# Patient Record
Sex: Female | Born: 1970 | Race: Black or African American | Hispanic: No | Marital: Single | Smoking: Current every day smoker
Health system: Southern US, Community
[De-identification: ages and names within clinical notes are randomized; demographics above are authoritative.]

## PROBLEM LIST (undated history)

## (undated) DIAGNOSIS — J45909 Unspecified asthma, uncomplicated: Secondary | ICD-10-CM

## (undated) DIAGNOSIS — E119 Type 2 diabetes mellitus without complications: Secondary | ICD-10-CM

## (undated) DIAGNOSIS — K852 Alcohol induced acute pancreatitis without necrosis or infection: Secondary | ICD-10-CM

## (undated) HISTORY — DX: Unspecified asthma, uncomplicated: J45.909

## (undated) HISTORY — PX: NO PAST SURGERIES: SHX2092

## (undated) HISTORY — DX: Type 2 diabetes mellitus without complications: E11.9

---

## 2007-07-19 ENCOUNTER — Other Ambulatory Visit: Payer: Self-pay

## 2007-07-19 ENCOUNTER — Inpatient Hospital Stay: Payer: Self-pay | Admitting: Internal Medicine

## 2007-09-16 ENCOUNTER — Emergency Department: Payer: Self-pay | Admitting: Emergency Medicine

## 2007-10-04 ENCOUNTER — Emergency Department: Payer: Self-pay | Admitting: Emergency Medicine

## 2008-09-01 ENCOUNTER — Emergency Department: Payer: Self-pay | Admitting: Emergency Medicine

## 2009-04-09 ENCOUNTER — Emergency Department: Payer: Self-pay | Admitting: Emergency Medicine

## 2009-07-15 ENCOUNTER — Emergency Department: Payer: Self-pay | Admitting: Emergency Medicine

## 2012-04-07 LAB — HM DIABETES EYE EXAM

## 2012-06-12 ENCOUNTER — Inpatient Hospital Stay: Payer: Self-pay | Admitting: Internal Medicine

## 2012-06-12 LAB — URINALYSIS, COMPLETE
Bilirubin,UR: NEGATIVE
Glucose,UR: >=500 mg/dL (ref 0–75)
Leukocyte Esterase: NEGATIVE
Nitrite: NEGATIVE
Ph: 5 (ref 4.5–8.0)
Protein: NEGATIVE
RBC,UR: 1 /HPF (ref 0–5)
Squamous Epithelial: <1
WBC UR: 1 /HPF (ref 0–5)

## 2012-06-12 LAB — CBC
HGB: 14.5 g/dL (ref 12.0–16.0)
MCV: 95 fL (ref 80–100)
Platelet: 301 10*3/uL (ref 150–440)
RBC: 4.68 10*6/uL (ref 3.80–5.20)
WBC: 6 10*3/uL (ref 3.6–11.0)

## 2012-06-12 LAB — COMPREHENSIVE METABOLIC PANEL
Alkaline Phosphatase: 73 U/L (ref 50–136)
BUN: 9 mg/dL (ref 7–18)
Bilirubin,Total: 0.4 mg/dL (ref 0.2–1.0)
Chloride: 95 mmol/L — ABNORMAL LOW (ref 98–107)
Creatinine: 0.83 mg/dL (ref 0.60–1.30)
EGFR (Non-African Amer.): >60
Glucose: 596 mg/dL (ref 65–99)
Osmolality: 285 (ref 275–301)
SGOT(AST): 20 U/L (ref 15–37)
SGPT (ALT): 14 U/L
Total Protein: 8.5 g/dL — ABNORMAL HIGH (ref 6.4–8.2)

## 2012-06-12 LAB — TSH: Thyroid Stimulating Horm: 1.05 u[IU]/mL

## 2012-06-12 LAB — ETHANOL: Ethanol %: <0.003 % (ref 0.000–0.080)

## 2012-06-12 LAB — TROPONIN I: Troponin-I: <0.02 ng/mL

## 2012-06-13 LAB — MAGNESIUM: Magnesium: 1.3 mg/dL — ABNORMAL LOW

## 2012-06-13 LAB — LIPID PANEL
Ldl Cholesterol, Calc: 116 mg/dL — ABNORMAL HIGH (ref 0–100)
Triglycerides: 93 mg/dL (ref 0–200)

## 2012-06-13 LAB — CBC WITH DIFFERENTIAL/PLATELET
Basophil #: 0 10*3/uL (ref 0.0–0.1)
Eosinophil #: 0.1 10*3/uL (ref 0.0–0.7)
HCT: 35.2 % (ref 35.0–47.0)
Lymphocyte #: 2.4 10*3/uL (ref 1.0–3.6)
MCH: 30.9 pg (ref 26.0–34.0)
MCHC: 34.2 g/dL (ref 32.0–36.0)
MCV: 90 fL (ref 80–100)
Monocyte #: 0.6 x10 3/mm (ref 0.2–0.9)
Monocyte %: 11.8 %
Neutrophil #: 2.2 10*3/uL (ref 1.4–6.5)
Neutrophil %: 40.5 %
Platelet: 239 10*3/uL (ref 150–440)
RDW: 12.2 % (ref 11.5–14.5)
WBC: 5.3 10*3/uL (ref 3.6–11.0)

## 2012-06-13 LAB — HEMOGLOBIN A1C: Hemoglobin A1C: 13.9 % — ABNORMAL HIGH (ref 4.2–6.3)

## 2012-06-13 LAB — BASIC METABOLIC PANEL
BUN: 3 mg/dL — ABNORMAL LOW (ref 7–18)
Calcium, Total: 7.4 mg/dL — ABNORMAL LOW (ref 8.5–10.1)
Chloride: 110 mmol/L — ABNORMAL HIGH (ref 98–107)
Co2: 20 mmol/L — ABNORMAL LOW (ref 21–32)
Creatinine: 0.7 mg/dL (ref 0.60–1.30)
EGFR (African American): >60
EGFR (African American): >60
EGFR (Non-African Amer.): >60
EGFR (Non-African Amer.): >60
Glucose: 91 mg/dL (ref 65–99)
Osmolality: 278 (ref 275–301)
Potassium: 4 mmol/L (ref 3.5–5.1)
Sodium: 137 mmol/L (ref 136–145)

## 2012-06-14 LAB — BASIC METABOLIC PANEL
Anion Gap: 7 (ref 7–16)
Anion Gap: 8 (ref 7–16)
BUN: 1 mg/dL — ABNORMAL LOW (ref 7–18)
BUN: 2 mg/dL — ABNORMAL LOW (ref 7–18)
Calcium, Total: 7.7 mg/dL — ABNORMAL LOW (ref 8.5–10.1)
Chloride: 110 mmol/L — ABNORMAL HIGH (ref 98–107)
Chloride: 110 mmol/L — ABNORMAL HIGH (ref 98–107)
Chloride: 108 mmol/L — ABNORMAL HIGH (ref 98–107)
Co2: 24 mmol/L (ref 21–32)
Creatinine: 0.55 mg/dL — ABNORMAL LOW (ref 0.60–1.30)
Creatinine: 0.67 mg/dL (ref 0.60–1.30)
EGFR (Non-African Amer.): >60
EGFR (Non-African Amer.): >60
Glucose: 63 mg/dL — ABNORMAL LOW (ref 65–99)
Glucose: 60 mg/dL — ABNORMAL LOW (ref 65–99)
Osmolality: 275 (ref 275–301)
Potassium: 2.8 mmol/L — ABNORMAL LOW (ref 3.5–5.1)
Potassium: 3.5 mmol/L (ref 3.5–5.1)
Sodium: 143 mmol/L (ref 136–145)

## 2013-07-14 ENCOUNTER — Encounter: Payer: Self-pay | Admitting: Emergency Medicine

## 2013-07-14 ENCOUNTER — Encounter: Payer: Self-pay | Admitting: Adult Health

## 2013-07-14 ENCOUNTER — Ambulatory Visit (INDEPENDENT_AMBULATORY_CARE_PROVIDER_SITE_OTHER): Payer: BC Managed Care – PPO | Admitting: Adult Health

## 2013-07-14 VITALS — BP 106/50 | HR 103 | Temp 98.4°F | Resp 12 | Ht 62.5 in | Wt 126.0 lb

## 2013-07-14 DIAGNOSIS — Z0000 Encounter for general adult medical examination without abnormal findings: Secondary | ICD-10-CM | POA: Insufficient documentation

## 2013-07-14 DIAGNOSIS — Z1239 Encounter for other screening for malignant neoplasm of breast: Secondary | ICD-10-CM

## 2013-07-14 DIAGNOSIS — E119 Type 2 diabetes mellitus without complications: Secondary | ICD-10-CM | POA: Insufficient documentation

## 2013-07-14 LAB — HM DIABETES FOOT EXAM: HM Diabetic Foot Exam: NORMAL

## 2013-07-14 MED ORDER — INSULIN GLARGINE 100 UNIT/ML ~~LOC~~ SOLN: 20.0000 [IU] | Freq: Every day | SUBCUTANEOUS | Status: DC

## 2013-07-14 NOTE — Patient Instructions (Addendum)
   Thank you for choosing  at [facility] for your health care needs.  The results will be available through MyChart for your convenience. Please remember to activate this. The activation code is located at the end of this form.  I will request medical records from previous providers.  I am referring you to Endocrine.  I am ordering a Mammogram

## 2013-07-14 NOTE — Progress Notes (Signed)
Subjective:    Patient ID: Mary Haney, female    DOB: [birth year], 42 y.o.   MRN: [id number]  HPI  Patient is a pleasant 42 y/o female who presents to clinic to establish care. She did not have a primary care physician. In August 2013 she walked in to the ED because she was not feeling well. She was found to have a blood glucose level of 600 + and was admitted with DKA. She was followed by Dr. Alesia Morin immediately following initial diagnosis. She would like to establish with a [place] endocrinologist to keep everything within the same health system.   Past Medical History  Diagnosis Date  . Asthma     Has albuterol inhaler  . Diabetes mellitus without complication     Type 2 diagnosed 2013    History reviewed. No pertinent past surgical history.  Family History  Problem Relation Age of Onset  . Diabetes Father   . Depression Paternal Grandmother   . Depression Paternal Grandfather   . Depression Cousin     History   Social History  . Marital Status: Single    Spouse Name: N/A    Number of Children: 0  . Years of Education: 14   Occupational History  . Take Out Specialist     [employer]   Social History Main Topics  . Smoking status: Current Every Day Smoker -- 0.50 packs/day for 25 years    Types: Cigarettes  . Smokeless tobacco: Never Used  . Alcohol Use: 1.8 oz/week    3 Cans of beer per week  . Drug Use: No  . Sexually Active: Yes    Birth Control/ Protection: Condom   Other Topics Concern  . Not on file   Social History Narrative   [name] was born in [place], [place]. She moved with her family to [place] at age 47. She attended 2 years at [employer] and was majoring in [place] Studies. Her goal is to become a Nurse, learning disability in the [employer]. 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 Icel lives with her boyfriend, Vilinda Blanks. They are considering getting married soon. She enjoys sketching outdoor scenery, cartoons and loves to read.      Review of Systems  Constitutional: Negative.   HENT: Negative.   Eyes: Negative for visual disturbance.       Wears glasses. Has vision exam every 2 years  Respiratory: Negative.   Cardiovascular: Negative.   Gastrointestinal: Positive for blood in stool. Negative for constipation.       Hx of hemorrhoids  Endocrine: Negative.   Genitourinary: Negative.   Musculoskeletal: Negative.   Skin: Negative.   Allergic/Immunologic:       Seasonal allergies - takes allegra  Neurological: Negative.   Hematological: Negative.   Psychiatric/Behavioral: Negative.     BP 106/50  Pulse 103  Temp(Src) 98.4 F (36.9 C) (Oral)  Resp 12  Ht 5' 2.5" (1.588 m)  Wt 126 lb (57.153 kg)  BMI 22.66 kg/m2  SpO2 99%  LMP 06/27/2013    Objective:   Physical Exam  Constitutional: She is oriented to person, place, and time. She appears well-developed and well-nourished. No distress.  HENT:  Head: Normocephalic and atraumatic.  Right Ear: External ear normal.  Left Ear: External ear normal.  Nose: Nose normal.  Mouth/Throat: Oropharynx is clear and moist.  Eyes: Conjunctivae and EOM are normal. Pupils are equal, round, and reactive to light.  Neck: Normal range of motion. Neck  supple. No tracheal deviation present. No thyromegaly present.  Cardiovascular: Normal rate, regular rhythm, normal heart sounds and intact distal pulses.  Exam reveals no gallop and no friction rub.   No murmur heard. Pulmonary/Chest: Effort normal and breath sounds normal. No respiratory distress. She has no wheezes. She has no rales. She exhibits no tenderness.  Abdominal: Soft. Bowel sounds are normal. She exhibits no distension. There is no tenderness. There is no rebound and no guarding.  Genitourinary:  Deferred - done at health center last month. Will request records.  Musculoskeletal: Normal range of motion. She exhibits no edema and no tenderness.  Neurological: She is alert and oriented to person, place, and  time. She has normal reflexes.  Skin: Skin is warm and dry.  Psychiatric: She has a normal mood and affect. Her behavior is normal. Judgment and thought content normal.       Assessment & Plan:

## 2013-07-14 NOTE — Assessment & Plan Note (Signed)
Ordered mammography. Patient will call [facility] to schedule.

## 2013-07-14 NOTE — Assessment & Plan Note (Addendum)
Normal physical exam. Patient is a newly diagnosed type II diabetic currently on insulin therapy. She was being followed at [facility] clinic; however, she would like to stay within the [facility]. Will refer to [facility] end[facility]inology. Diabetic foot exam done today and was normal. Recent Pap done at health Department. Will request records. I will order a mammography. I will hold off on ordering labs since patient has had recent lab work done. When I receive her medical records I will evaluate if anything needs to be ordered.

## 2013-10-05 ENCOUNTER — Ambulatory Visit: Payer: BC Managed Care – PPO | Admitting: Internal Medicine

## 2013-10-13 ENCOUNTER — Other Ambulatory Visit: Payer: Self-pay

## 2013-10-30 ENCOUNTER — Emergency Department: Payer: Self-pay | Admitting: Emergency Medicine

## 2013-10-30 LAB — COMPREHENSIVE METABOLIC PANEL
Albumin: 3.6 g/dL (ref 3.4–5.0)
Alkaline Phosphatase: 62 U/L
Anion Gap: 11 (ref 7–16)
BUN: 7 mg/dL (ref 7–18)
Bilirubin,Total: 0.2 mg/dL (ref 0.2–1.0)
Chloride: 102 mmol/L (ref 98–107)
Creatinine: 0.76 mg/dL (ref 0.60–1.30)
EGFR (Non-African Amer.): >60
Potassium: 4 mmol/L (ref 3.5–5.1)
SGOT(AST): 58 U/L — ABNORMAL HIGH (ref 15–37)
SGPT (ALT): 27 U/L (ref 12–78)
Sodium: 138 mmol/L (ref 136–145)
Total Protein: 7.1 g/dL (ref 6.4–8.2)

## 2013-10-30 LAB — CBC WITH DIFFERENTIAL/PLATELET
Basophil #: 0.1 10*3/uL (ref 0.0–0.1)
Basophil %: 1.4 %
Eosinophil %: 0.6 %
HGB: 13 g/dL (ref 12.0–16.0)
Lymphocyte #: 1.4 10*3/uL (ref 1.0–3.6)
MCH: 30.4 pg (ref 26.0–34.0)
Monocyte #: 0.5 x10 3/mm (ref 0.2–0.9)
Neutrophil %: 62.3 %
Platelet: 324 10*3/uL (ref 150–440)
RBC: 4.27 10*6/uL (ref 3.80–5.20)

## 2013-11-02 ENCOUNTER — Telehealth: Payer: Self-pay | Admitting: Adult Health

## 2013-11-02 ENCOUNTER — Ambulatory Visit: Payer: BC Managed Care – PPO | Admitting: Adult Health

## 2013-11-02 ENCOUNTER — Other Ambulatory Visit: Payer: Self-pay | Admitting: Adult Health

## 2013-11-02 DIAGNOSIS — E1165 Type 2 diabetes mellitus with hyperglycemia: Secondary | ICD-10-CM

## 2013-11-02 DIAGNOSIS — IMO0002 Reserved for concepts with insufficient information to code with codable children: Secondary | ICD-10-CM

## 2013-11-02 NOTE — Telephone Encounter (Signed)
Pt states her diabetes is acting up and she would like to be referred back to endocrinologist.  States she was previously referred but had to cancel the appt in October due to some personal things going on.  Pt has appt today but would like to cancel and just go to endocrinologist.    Call cell, if she does not answer, call at work [phone], may have ask for pt.

## 2013-11-02 NOTE — Telephone Encounter (Signed)
Noted. Referral made

## 2013-11-07 ENCOUNTER — Other Ambulatory Visit: Payer: Self-pay

## 2013-11-07 ENCOUNTER — Telehealth: Payer: Self-pay | Admitting: Adult Health

## 2013-11-07 ENCOUNTER — Encounter: Payer: Self-pay | Admitting: Emergency Medicine

## 2013-11-07 MED ORDER — BLOOD GLUCOSE METER KIT: PACK | Status: DC

## 2013-11-07 MED ORDER — ACETONE (URINE) TEST VI STRP: 1.0000 | ORAL_STRIP | Status: DC | PRN

## 2013-11-07 NOTE — Telephone Encounter (Signed)
Pt is needing refill on Test strips. Pt uses [facility] on [address].

## 2013-11-07 NOTE — Telephone Encounter (Signed)
Left message pt to call back how often is she testing

## 2013-11-07 NOTE — Telephone Encounter (Signed)
rx sent in for Relion meter and test strips

## 2013-11-08 ENCOUNTER — Telehealth: Payer: Self-pay | Admitting: *Deleted

## 2013-11-08 NOTE — Telephone Encounter (Signed)
Pharmacy Note:  Prime test strips  Need Qty and how many per day

## 2013-11-11 ENCOUNTER — Telehealth: Payer: Self-pay | Admitting: Adult Health

## 2013-11-11 NOTE — Telephone Encounter (Signed)
Pt states she was referred to Dr. Elvera Lennox for endocrinology.  Pt states she has switched insurance and thinks she will need to switch to a [facility] doctor.  Would like a call.

## 2013-11-11 NOTE — Telephone Encounter (Signed)
Left message pt to call her ins. Co to see which enodcrinologist is in her network and let us know

## 2014-01-04 ENCOUNTER — Encounter: Payer: Self-pay | Admitting: Internal Medicine

## 2014-01-04 ENCOUNTER — Ambulatory Visit (INDEPENDENT_AMBULATORY_CARE_PROVIDER_SITE_OTHER): Payer: BC Managed Care – PPO | Admitting: Internal Medicine

## 2014-01-04 VITALS — BP 118/68 | HR 95 | Temp 98.4°F | Resp 12 | Ht 63.0 in | Wt 134.1 lb

## 2014-01-04 DIAGNOSIS — E119 Type 2 diabetes mellitus without complications: Secondary | ICD-10-CM

## 2014-01-04 LAB — HEMOGLOBIN A1C: Hgb A1c MFr Bld: 8.5 % — ABNORMAL HIGH (ref 4.6–6.5)

## 2014-01-04 MED ORDER — GLUCAGON (RDNA) 1 MG IJ KIT: 1.0000 mg | PACK | Freq: Once | INTRAMUSCULAR | Status: DC | PRN

## 2014-01-04 NOTE — Patient Instructions (Addendum)
Please continue current insulin regimen for now, except decrease Lantus to 15 units at night. When injecting insulin:  Inject in the abdomen  Rotate the injection sites around the belly button  Change needle for each injection  Keep needle in for 10 sec after last unit of insulin in  Keep the insulin in use out of the fridge We will refer you to diabetes education at [place]. Please stop at the lab. Please return in 1 month with your sugar log.    PATIENT INSTRUCTIONS FOR TYPE 2 DIABETES:  DIET AND EXERCISE Diet and exercise is an important part of diabetic treatment.  We recommended aerobic exercise in the form of brisk walking (working between 40-60% of maximal aerobic capacity, similar to brisk walking) for 150 minutes per week (such as 30 minutes five days per week) along with 3 times per week performing 'resistance' training (using various gauge rubber tubes with handles) 5-10 exercises involving the major muscle groups (upper body, lower body and core) performing 10-15 repetitions (or near fatigue) each exercise. Start at half the above goal but build slowly to reach the above goals. If limited by weight, joint pain, or disability, we recommend daily walking in a swimming pool with water up to waist to reduce pressure from joints while allow for adequate exercise.    BLOOD GLUCOSES Monitoring your blood glucoses is important for continued management of your diabetes. Please check your blood glucoses 2-4 times a day: fasting, before meals and at bedtime (you can rotate these measurements - e.g. one day check before the 3 meals, the next day check before 2 of the meals and before bedtime, etc.   HYPOGLYCEMIA (low blood sugar) Hypoglycemia is usually a reaction to not eating, exercising, or taking too much insulin/ other diabetes drugs.  Symptoms include tremors, sweating, hunger, confusion, headache, etc. Treat IMMEDIATELY with 15 grams of Carbs:   4 glucose tablets     cup regular juice/soda   2 tablespoons raisins   4 teaspoons sugar   1 tablespoon honey Recheck blood glucose in 15 mins and repeat above if still symptomatic/blood glucose <100. Please contact our office at [phone] if you have questions about how to next handle your insulin.  RECOMMENDATIONS TO REDUCE YOUR RISK OF DIABETIC COMPLICATIONS: * Take your prescribed MEDICATION(S). * Follow a DIABETIC diet: Complex carbs, fiber rich foods, heart healthy fish twice weekly, (monounsaturated and polyunsaturated) fats * AVOID saturated/trans fats, high fat foods, >2,300 mg salt per day. * EXERCISE at least 5 times a week for 30 minutes or preferably daily.  * DO NOT SMOKE OR DRINK more than 1 drink a day. * Check your FEET every day. Do not wear tightfitting shoes. Contact us if you develop an ulcer * See your EYE doctor once a year or more if needed * Get a FLU shot once a year * Get a PNEUMONIA vaccine once before and once after age 71 years  GOALS:  * Your Hemoglobin A1c of <7%  * fasting sugars need to be <130 * after meals sugars need to be <180 (2h after you start eating) * Your Systolic BP should be 063 or lower  * Your Diastolic BP should be 80 or lower  * Your HDL (Good Cholesterol) should be 40 or higher  * Your LDL (Bad Cholesterol) should be 100 or lower  * Your Triglycerides should be 150 or lower  * Your Urine microalbumin (kidney function) should be <30 * Your Body Mass Index should  be 25 or lower   We will be glad to help you achieve these goals. Our telephone number is: [phone].

## 2014-01-04 NOTE — Progress Notes (Signed)
Patient ID: Mary Haney, female   DOB: 1971-04-09, 43 y.o.   MRN: [id number]  HPI: Mary Haney is a 43 y.o.-year-old female, referred by her PCP, Rey,[name], NP, for management of DM2, insulin-dependent, uncontrolled, without complications.  Per PCP's note: She did not have a primary care physician. In August 2013 she walked in to the ED because she was not feeling well. She was found to have a blood glucose level of 600 + and was admitted with DKA. She was followed by [name] immediately following initial diagnosis. She would like to establish with a [facility] endocrinologist to keep everything within the same health system.  Patient has been diagnosed with diabetes in 2013. Last hemoglobin A1c not in the system: ~10% approx 9 mo ago.  Pt is on a regimen of: - Lantus 20 units qhs - Novolog 3 units tid ac and 1 unit for snacks  In 10/2013, due to lack of finances, she was stretching the insulin doses >> sugars high >> low CBG 25-26 >> ED.   Pt checks her sugars >3x a day and they are: - am: 90-140  (~1-2x a week: 200-300 - unexplained) - 2h after b'fast: n/c - before lunch: 123-140 - 2h after lunch: n/c - before dinner: 80-100 - 2h after dinner: n/c - bedtime: n/c - nighttime: n/c + lows - see above. Lowest sugar was 55-60 when delays a meal; she has hypoglycemia awareness at 60.  Highest sugar was 300.  Has ReliOn meter.  Pt's meals are: - Breakfast: oatmeal, yoghurt, sausage bisquit - Lunch: salad, soup, sandwiches - Dinner: meat + starch + vegetables - Snacks: yoghurt, peanut crackers, nuts  - no CKD - no HL - last eye exam was in 1 year ago. No DR.  - no numbness and tingling in her feet. Foot exam normal, checked by PCP 07/2013.  Pt has FH of DM in PGF, father, MGF, aunt  ROS: Constitutional: no weight gain/loss, no fatigue, no subjective hyperthermia/hypothermia Eyes: no blurry vision, no xerophthalmia ENT: no sore throat, no nodules palpated in throat, no  dysphagia/odynophagia, no hoarseness Cardiovascular: no CP/SOB/palpitations/leg swelling Respiratory: + cough/no SOB Gastrointestinal: no N/V/D/C Musculoskeletal: no muscle/joint aches Skin: + rash (eczema) Neurological: no tremors/numbness/tingling/dizziness Psychiatric: no depression/anxiety  Past Medical History  Diagnosis Date  . Asthma     Has albuterol inhaler  . Diabetes mellitus without complication     Type 2 diagnosed 2013   History reviewed. No pertinent past surgical history. History   Social History  . Marital Status: Single    Spouse Name: N/A    Number of Children: 0  . Years of Education: 14   Occupational History  . Take Out Specialist     [employer]   Social History Main Topics  . Smoking status: Current Every Day Smoker -- 0.50 packs/day for 25 years    Types: Cigarettes  . Smokeless tobacco: Never Used  . Alcohol Use: 1.8 oz/week    3 Cans of beer per week  . Drug Use: No  . Sexual Activity: Yes    Birth Control/ Protection: Condom   Social History Narrative   Mary Haney was born in [place], [place]. She moved with her family to [place] at age 31. She attended 2 years at [employer] and was majoring in [place]. Her goal is to become a Optometrist in the [employer]. 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 [name] lives with her boyfriend, Mary Haney. They are considering  getting married soon. She enjoys sketching outdoor scenery, cartoons and loves to read.   Current Outpatient Prescriptions on File Prior to Visit  Medication Sig Dispense Refill  . acetone, urine, test strip 1 strip by Does not apply route as needed for high blood sugar.  25 each  0  . albuterol (PROVENTIL HFA;VENTOLIN HFA) 108 (90 BASE) MCG/ACT inhaler Inhale 2 puffs into the lungs every 6 (six) hours as needed for wheezing.      . Blood Glucose Monitoring Suppl (BLOOD GLUCOSE METER) kit Use as instructed  1 each  0  . cetirizine (ZYRTEC) 10 MG tablet  Take 10 mg by mouth daily.      . insulin glargine (LANTUS) 100 UNIT/ML injection Inject 0.2 mLs (20 Units total) into the skin at bedtime.  10 mL  6  . Insulin Lispro, Human, (HUMALOG KWIKPEN [place]) Inject 3 Units into the skin 3 (three) times daily with meals. 1 unit with snacks + sliding scale       No current facility-administered medications on file prior to visit.   No Known Allergies Family History  Problem Relation Age of Onset  . Diabetes Father   . Depression Paternal Grandmother   . Depression Paternal Grandfather   . Depression Cousin    PE: BP 118/68  Pulse 95  Temp(Src) 98.4 F (36.9 C) (Oral)  Resp 12  Ht '5\' 3"'  (1.6 m)  Wt 134 lb 1.9 oz (60.836 kg)  BMI 23.76 kg/m2  SpO2 97% Wt Readings from Last 3 Encounters:  01/04/14 134 lb 1.9 oz (60.836 kg)  07/14/13 126 lb (57.153 kg)   Constitutional: normal weight, in NAD Eyes: PERRLA, EOMI, no exophthalmos ENT: moist mucous membranes, no thyromegaly, no cervical lymphadenopathy Cardiovascular: RRR, No MRG Respiratory: CTA B Gastrointestinal: abdomen soft, NT, ND, BS+ Musculoskeletal: no deformities, strength intact in all 4 Skin: moist, warm, no rashes Neurological: no tremor with outstretched hands, DTR normal in all 4  ASSESSMENT: 1. DM2, non-insulin-dependent, uncontrolled, without complications  PLAN:  1. Patient with recently dx-ed DM, unclear if type 1 or 2, on basal-bolus insulin regimen,with fair control - We discussed about options for treatment, and I suggested to:  Patient Instructions  Please continue current insulin regimen for now, except decrease Lantus to 15 units at night. When injecting insulin:  Inject in the abdomen  Rotate the injection sites around the belly button  Change needle for each injection  Keep needle in for 10 sec after last unit of insulin in  Keep the insulin in use out of the fridge We will refer you to diabetes education at [place]. Please stop at the  lab. Please return in 1 month with your sugar log.  - sent glucagon emergency kit Rx x2 to the pharmacy and advised her how to use it - Strongly advised her to start checking sugars at different times of the day - check 3 times a day, rotating checks - given sugar log and advised how to fill it and to bring it at next appt  - given foot care handout and explained the principles  - given instructions for hypoglycemia management "15-15 rule"  - advised for yearly eye exams - check Hba1c, will also need a C-pp, randon Glu, anti GAD and anti-ICA Abx to see if tyoe 1 - Return to clinic in 1 mo with sugar log   Office Visit on 01/04/2014  Component Date Value Range Status  . Hemoglobin A1C 01/04/2014 8.5* 4.6 - 6.5 %  Final   Glycemic Control Guidelines for People with Diabetes:Non Diabetic:  <6%Goal of Therapy: <7%Additional Action Suggested:  >8%    Will await the rest of the labs: Orders Placed This Encounter  Procedures  . HgB A1c  . C-peptide  . Glucose, Random  . Glutamic acid decarboxylase auto abs  . Anti-islet cell antibody  . Ambulatory referral to diabetic education   01/10/2014 Labs still pending >> I will addend the results when they become available.

## 2014-01-05 ENCOUNTER — Telehealth: Payer: Self-pay | Admitting: Radiology

## 2014-01-05 NOTE — Telephone Encounter (Signed)
I called and spoke with the patient about doing additional lab work. I gave her the instructions to follow about blood sugar being below 150 on her monitor before coming in. She will come in the next few days.

## 2014-01-27 ENCOUNTER — Encounter: Payer: Self-pay | Admitting: Adult Health

## 2014-01-27 ENCOUNTER — Ambulatory Visit (INDEPENDENT_AMBULATORY_CARE_PROVIDER_SITE_OTHER): Payer: BC Managed Care – PPO | Admitting: Adult Health

## 2014-01-27 VITALS — BP 102/72 | HR 85 | Temp 98.2°F | Resp 14 | Wt 133.0 lb

## 2014-01-27 DIAGNOSIS — K1379 Other lesions of oral mucosa: Secondary | ICD-10-CM

## 2014-01-27 DIAGNOSIS — K1370 Unspecified lesions of oral mucosa: Secondary | ICD-10-CM

## 2014-01-27 MED ORDER — CHLORHEXIDINE GLUCONATE 0.12 % MT SOLN: 15.0000 mL | Freq: Two times a day (BID) | OROMUCOSAL | Status: DC

## 2014-01-27 NOTE — Progress Notes (Signed)
Patient ID: Mary Haney, female   DOB: July 19, 1971, 43 y.o.   MRN: [id number]    Subjective:    Patient ID: Mary Haney, female    DOB: [birth year], 43 y.o.   MRN: [id number]  HPI  Kalene presents to clinic with c/o the roof of her mouth being sore. She does not recall eating anything hard or too hot that may have irritated the roof of her mouth. She reports tenderness in the area making it difficult to eat.   Past Medical History  Diagnosis Date  . Asthma     Has albuterol inhaler  . Diabetes mellitus without complication     Type 2 diagnosed 2013    Current Outpatient Prescriptions on File Prior to Visit  Medication Sig Dispense Refill  . albuterol (PROVENTIL HFA;VENTOLIN HFA) 108 (90 BASE) MCG/ACT inhaler Inhale 2 puffs into the lungs every 6 (six) hours as needed for wheezing.      . Blood Glucose Monitoring Suppl (BLOOD GLUCOSE METER) kit Use as instructed  1 each  0  . cetirizine (ZYRTEC) 10 MG tablet Take 10 mg by mouth daily.      Marland Kitchen glucagon (GLUCAGON EMERGENCY) 1 MG injection Inject 1 mg into the muscle once as needed.  2 each  prn  . insulin glargine (LANTUS) 100 UNIT/ML injection Inject 0.2 mLs (20 Units total) into the skin at bedtime.  10 mL  6  . Insulin Lispro, Human, (HUMALOG KWIKPEN [place]) Inject 3 Units into the skin 3 (three) times daily with meals. 1 unit with snacks + sliding scale       No current facility-administered medications on file prior to visit.     Review of Systems  Constitutional: Negative.  Negative for fever, chills and fatigue.  HENT: Positive for mouth sores (hard palate with small area of irritation).   Respiratory: Negative.   Cardiovascular: Negative.   Gastrointestinal: Negative.   Neurological: Negative.   All other systems reviewed and are negative.       Objective:  BP 102/72  Pulse 85  Temp(Src) 98.2 F (36.8 C) (Oral)  Resp 14  Wt 133 lb (60.328 kg)  SpO2 99%  LMP 01/17/2014   Physical Exam  Constitutional: She is  oriented to person, place, and time. She appears well-developed and well-nourished. No distress.  HENT:  Small lesion on hard palate without any redness   Cardiovascular: Normal rate and regular rhythm.   Pulmonary/Chest: Effort normal. No respiratory distress.  Neurological: She is alert and oriented to person, place, and time.  Psychiatric: She has a normal mood and affect. Her behavior is normal. Judgment and thought content normal.       Assessment & Plan:   1. Lesion of hard palate Appears irritated from either eating something hard or too hot. Start Peridex mouth rinse bid x 1 week. Instructed pt to eat bland foods. If no improvement will refer to ENT.

## 2014-01-27 NOTE — Progress Notes (Signed)
Pre visit review using our clinic review tool, if applicable. No additional management support is needed unless otherwise documented below in the visit note. 

## 2014-01-27 NOTE — Patient Instructions (Signed)
  Use Peridex mouth rinse twice a day after brushing your teeth.  Please let me know through MyChart if your symptoms are not improved by Wednesday and I will refer you to ENT.

## 2014-01-30 ENCOUNTER — Telehealth: Payer: Self-pay | Admitting: Adult Health

## 2014-01-30 NOTE — Telephone Encounter (Signed)
Relevant patient education assigned to patient using Emmi. ° °

## 2014-02-14 ENCOUNTER — Telehealth: Payer: Self-pay | Admitting: Adult Health

## 2014-02-14 NOTE — Telephone Encounter (Signed)
Humalog quik pen needed.  [place]

## 2014-02-14 NOTE — Telephone Encounter (Signed)
Left vm advising pt to have her pharmacy send [place] the medication refill

## 2014-02-17 ENCOUNTER — Telehealth: Payer: Self-pay

## 2014-02-17 ENCOUNTER — Other Ambulatory Visit: Payer: Self-pay | Admitting: *Deleted

## 2014-02-17 ENCOUNTER — Telehealth: Payer: Self-pay | Admitting: Internal Medicine

## 2014-02-17 MED ORDER — INSULIN ASPART 100 UNIT/ML FLEXPEN: PEN_INJECTOR | SUBCUTANEOUS | Status: DC

## 2014-02-17 NOTE — Telephone Encounter (Signed)
Please read note below. I am sure the pt meant novolog. Please advise. Thank you.

## 2014-02-17 NOTE — Telephone Encounter (Signed)
Pt request refill on insulin that [name] Rey NP and Dr Cruzita Lederer has not filled;transferred pt to Dr Arman Filter office [phone].

## 2014-02-17 NOTE — Telephone Encounter (Signed)
Rx changed to Novolog flexpen.

## 2014-02-17 NOTE — Telephone Encounter (Signed)
Please call in the rx for novo nordisk instead of humalog so her insurance will cover it

## 2014-02-17 NOTE — Telephone Encounter (Signed)
Yes, NovoLog - can you please change? Thank you!

## 2014-02-23 ENCOUNTER — Encounter: Payer: Self-pay | Admitting: Adult Health

## 2014-02-23 ENCOUNTER — Ambulatory Visit (INDEPENDENT_AMBULATORY_CARE_PROVIDER_SITE_OTHER): Payer: BC Managed Care – PPO | Admitting: Adult Health

## 2014-02-23 VITALS — BP 110/78 | HR 90 | Temp 98.4°F | Resp 14 | Wt 134.0 lb

## 2014-02-23 DIAGNOSIS — N898 Other specified noninflammatory disorders of vagina: Secondary | ICD-10-CM

## 2014-02-23 DIAGNOSIS — R300 Dysuria: Secondary | ICD-10-CM | POA: Insufficient documentation

## 2014-02-23 DIAGNOSIS — M542 Cervicalgia: Secondary | ICD-10-CM | POA: Insufficient documentation

## 2014-02-23 LAB — POCT URINALYSIS DIPSTICK
Bilirubin, UA: NEGATIVE
Glucose, UA: NEGATIVE
Ketones, UA: NEGATIVE
Leukocytes, UA: NEGATIVE
Nitrite, UA: NEGATIVE
PROTEIN UA: NEGATIVE
RBC UA: NEGATIVE
Spec Grav, UA: 1.02
UROBILINOGEN UA: 0.2
pH, UA: 6

## 2014-02-23 MED ORDER — CYCLOBENZAPRINE HCL 5 MG PO TABS: 5.0000 mg | ORAL_TABLET | Freq: Three times a day (TID) | ORAL | Status: DC | PRN

## 2014-02-23 MED ORDER — METRONIDAZOLE 500 MG PO TABS: 500.0000 mg | ORAL_TABLET | Freq: Two times a day (BID) | ORAL | Status: DC

## 2014-02-23 NOTE — Progress Notes (Signed)
Pre visit review using our clinic review tool, if applicable. No additional management support is needed unless otherwise documented below in the visit note. 

## 2014-02-23 NOTE — Progress Notes (Signed)
Patient ID: Mary Haney, female   DOB: [birth year], 43 y.o.   MRN: [id number]    Subjective:    Patient ID: Mary Haney, female    DOB: 1971/02/02, 43 y.o.   MRN: [id number]  HPI  Pt is a pleasant 42 y/o female who presents to clinic with irritation around genitalia. She reports burning and stinging when urine comes into contact with skin. She reports having some discharge.  Reports neck pain. 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 Does not recall particular incident but neck started hurting a few days ago. Good ROM but painful. No headache.  Past Medical History  Diagnosis Date  . Asthma     Has albuterol inhaler  . Diabetes mellitus without complication     Type 2 diagnosed 2013    Current Outpatient Prescriptions on File Prior to Visit  Medication Sig Dispense Refill  . albuterol (PROVENTIL HFA;VENTOLIN HFA) 108 (90 BASE) MCG/ACT inhaler Inhale 2 puffs into the lungs every 6 (six) hours as needed for wheezing.      . Blood Glucose Monitoring Suppl (BLOOD GLUCOSE METER) kit Use as instructed  1 each  0  . cetirizine (ZYRTEC) 10 MG tablet Take 10 mg by mouth daily.      . chlorhexidine (PERIDEX) 0.12 % solution Use as directed 15 mLs in the mouth or throat 2 (two) times daily.  120 mL  0  . glucagon (GLUCAGON EMERGENCY) 1 MG injection Inject 1 mg into the muscle once as needed.  2 each  prn  . insulin aspart (NOVOLOG FLEXPEN) 100 UNIT/ML FlexPen Inject 3 Units into the skin 3 (three) times daily with meals. 1 unit with snacks + sliding scale  15 mL  2  . insulin glargine (LANTUS) 100 UNIT/ML injection Inject 0.2 mLs (20 Units total) into the skin at bedtime.  10 mL  6  . triamcinolone ointment (KENALOG) 0.1 %        No current facility-administered medications on file prior to visit.     Review of Systems  Constitutional: Negative.   Respiratory: Negative.   Cardiovascular: Negative.   Genitourinary: Positive for dysuria (burning when  urine comes in contact with skin). Negative for hematuria, vaginal bleeding, vaginal discharge and pelvic pain.  Musculoskeletal: Positive for neck pain. Negative for back pain and neck stiffness.  Neurological: Negative.   Psychiatric/Behavioral: Negative.   All other systems reviewed and are negative.       Objective:  BP 110/78  Pulse 90  Temp(Src) 98.4 F (36.9 C) (Oral)  Resp 14  Wt 134 lb (60.782 kg)  SpO2 98%  LMP 02/14/2014   Physical Exam  Constitutional: She appears well-developed and well-nourished. No distress.  Cardiovascular: Normal rate and regular rhythm.   Pulmonary/Chest: Effort normal. No respiratory distress.  Abdominal: Hernia confirmed negative in the right inguinal area and confirmed negative in the left inguinal area.  Genitourinary: Rectal exam shows external hemorrhoid. No labial fusion. There is no rash, tenderness, lesion or injury on the right labia. There is no rash, tenderness, lesion or injury on the left labia. Uterus is not deviated, not enlarged, not fixed and not tender. Cervix exhibits no motion tenderness, no discharge and no friability. Right adnexum displays no mass, no tenderness and no fullness. Left adnexum displays no mass, no tenderness and no fullness. No erythema, tenderness or bleeding around the vagina. No foreign body around the vagina. No signs of injury around the vagina. Vaginal  discharge found.    Musculoskeletal: Normal range of motion.  Pain on right side of her neck with turning head towards the right. Less pain when turning towards the left. Good ROM with movement of head up and down.  Lymphadenopathy:       Right: No inguinal adenopathy present.       Left: No inguinal adenopathy present.       Assessment & Plan:   1. Dysuria UA without evidence of UTI - POCT urinalysis dipstick  2. Vaginal discharge Copious amounts of white discharge at cervix. External genitalia without lesions, ulcerations, inflammation, warts.  There are external hemorrhoids. No cystocele, rectocele or prolapsed uterus. Cervix without inflammation, lesions, growth, nodules. There is discharge noted. There was no bleeding. Vaginal walls also normal - pink and rugose without inflammation, ulcers or color changes. Bimanual exam also normal.  No tenderness noted with palpation of the uterus. No adnexal masses appreciated during exam.  - Wet Prep - Vaginal culture   3. Neck pain Suspect secondary to pulled muscle. Ice alternate with heat, ibuprofen and flexeril. See pt instruction for full POC.

## 2014-02-23 NOTE — Patient Instructions (Signed)
  Start metronidazole 500 mg twice a day for 7 days.  Flexeril 3 times a day as needed for muscle spasms in your neck.  Ibuprofen 400 mg every 6 hours for the next 4 days and then as needed.  Apply ice alternating with heat to your neck 3-4 times a day.  Please call with any questions or concerns.

## 2014-02-24 ENCOUNTER — Telehealth: Payer: Self-pay | Admitting: Adult Health

## 2014-02-24 NOTE — Telephone Encounter (Signed)
Relevant patient education assigned to patient using Emmi. ° °

## 2014-02-24 NOTE — Addendum Note (Signed)
Addended by: Marchia Bond on: 02/24/2014 08:27 AM   Modules accepted: Orders

## 2014-02-27 LAB — WET PREP BY MOLECULAR PROBE
CANDIDA SPECIES: POSITIVE — AB
Gardnerella vaginalis: POSITIVE — AB
Trichomonas vaginosis: NEGATIVE

## 2014-02-27 LAB — CULTURE, ROUTINE-GENITAL: ORGANISM ID, BACTERIA: NORMAL

## 2014-02-28 ENCOUNTER — Encounter: Payer: Self-pay | Admitting: Adult Health

## 2014-03-02 NOTE — Telephone Encounter (Signed)
Unread mychart message mailed  

## 2014-03-09 ENCOUNTER — Encounter: Payer: Self-pay | Admitting: Adult Health

## 2014-03-09 ENCOUNTER — Ambulatory Visit (INDEPENDENT_AMBULATORY_CARE_PROVIDER_SITE_OTHER): Payer: BC Managed Care – PPO | Admitting: Adult Health

## 2014-03-09 ENCOUNTER — Ambulatory Visit (INDEPENDENT_AMBULATORY_CARE_PROVIDER_SITE_OTHER)
Admission: RE | Admit: 2014-03-09 | Discharge: 2014-03-09 | Disposition: A | Discharge status: Home / Self Care | Payer: BC Managed Care – PPO | Source: Ambulatory Visit | Attending: Adult Health | Admitting: Adult Health

## 2014-03-09 VITALS — BP 108/64 | HR 103 | Temp 98.4°F | Wt 135.0 lb

## 2014-03-09 DIAGNOSIS — M542 Cervicalgia: Secondary | ICD-10-CM

## 2014-03-09 MED ORDER — METHOCARBAMOL 750 MG PO TABS: 750.0000 mg | ORAL_TABLET | Freq: Three times a day (TID) | ORAL | Status: DC

## 2014-03-09 MED ORDER — HYDROCODONE-ACETAMINOPHEN 5-325 MG PO TABS: 1.0000 | ORAL_TABLET | Freq: Four times a day (QID) | ORAL | Status: DC | PRN

## 2014-03-09 NOTE — Progress Notes (Signed)
Pre visit review using our clinic review tool, if applicable. No additional management support is needed unless otherwise documented below in the visit note. 

## 2014-03-09 NOTE — Progress Notes (Signed)
Patient ID: Mary Haney, female   DOB: 1971/09/08, 43 y.o.   MRN: [id number]   Subjective:    Patient ID: Mary Haney, female    DOB: 08-06-1971, 43 y.o.   MRN: [id number]  HPI  Mary Haney presents with neck pain that has been ongoing for ~ 1 month. She woke up one morning with the tightness and pain. Thought she had slept wrong. She works at [employer] and is always carrying heavy objects. She is right handed. Pain is mainly on the right side. I had prescribed some flexeril and asked her to take ibuprofen; however, this has not really helped her.   Past Medical History  Diagnosis Date  . Asthma     Has albuterol inhaler  . Diabetes mellitus without complication     Type 2 diagnosed 2013    Current Outpatient Prescriptions on File Prior to Visit  Medication Sig Dispense Refill  . albuterol (PROVENTIL HFA;VENTOLIN HFA) 108 (90 BASE) MCG/ACT inhaler Inhale 2 puffs into the lungs every 6 (six) hours as needed for wheezing.      . Blood Glucose Monitoring Suppl (BLOOD GLUCOSE METER) kit Use as instructed  1 each  0  . cetirizine (ZYRTEC) 10 MG tablet Take 10 mg by mouth daily.      . chlorhexidine (PERIDEX) 0.12 % solution Use as directed 15 mLs in the mouth or throat 2 (two) times daily.  120 mL  0  . cyclobenzaprine (FLEXERIL) 5 MG tablet Take 1 tablet (5 mg total) by mouth 3 (three) times daily as needed for muscle spasms.  30 tablet  1  . glucagon (GLUCAGON EMERGENCY) 1 MG injection Inject 1 mg into the muscle once as needed.  2 each  prn  . insulin aspart (NOVOLOG FLEXPEN) 100 UNIT/ML FlexPen Inject 3 Units into the skin 3 (three) times daily with meals. 1 unit with snacks + sliding scale  15 mL  2  . insulin glargine (LANTUS) 100 UNIT/ML injection Inject 0.2 mLs (20 Units total) into the skin at bedtime.  10 mL  6  . metroNIDAZOLE (FLAGYL) 500 MG tablet Take 1 tablet (500 mg total) by mouth 2 (two) times daily.  14 tablet  0  . triamcinolone ointment (KENALOG) 0.1 %        No  current facility-administered medications on file prior to visit.     Review of Systems  Constitutional: Negative for fever.  HENT: Negative.   Respiratory: Negative.   Cardiovascular: Negative.   Musculoskeletal: Positive for neck pain.       Muscle stiffness mainly on the right side. Difficulty turning head towards the right.  Neurological: Negative for dizziness, tremors, weakness, light-headedness and headaches.  All other systems reviewed and are negative.       Objective:  BP 108/64  Pulse 103  Temp(Src) 98.4 F (36.9 C) (Oral)  Wt 135 lb (61.236 kg)  SpO2 99%  LMP 03/06/2014   Physical Exam  Constitutional: She is oriented to person, place, and time. She appears well-developed and well-nourished. No distress.  Cardiovascular: Normal rate.   Pulmonary/Chest: Effort normal. No respiratory distress.  Musculoskeletal: She exhibits tenderness. She exhibits no edema.  Able to flex and extend neck without discomfort. Pain with turning head towards the right side. Pain is localized in the cervical region with tenderness towards the right trapezius area. No other symptoms associated with her pain  Neurological: She is alert and oriented to person, place, and time. No cranial nerve deficit. Coordination normal.  Skin: Skin is warm and dry. No rash noted. No erythema.  Psychiatric: She has a normal mood and affect. Her behavior is normal. Judgment and thought content normal.       Assessment & Plan:   1. Neck pain Send for plain films of the cervical region. Avoid carrying heavy objects with right hand to allow for rest. Will change muscle relaxer to robaxin and add norco short term. Continue ice. Ibuprofen in between the norco for inflammation. If no improvement within the next week will refer to ortho.  - DG Cervical Spine Complete; Future

## 2014-03-10 ENCOUNTER — Encounter: Payer: Self-pay | Admitting: Adult Health

## 2014-03-10 ENCOUNTER — Other Ambulatory Visit: Payer: Self-pay | Admitting: Adult Health

## 2014-03-10 DIAGNOSIS — M542 Cervicalgia: Secondary | ICD-10-CM

## 2014-03-14 NOTE — Telephone Encounter (Signed)
Mailed unread MyChart message

## 2014-06-12 ENCOUNTER — Other Ambulatory Visit: Payer: Self-pay | Admitting: *Deleted

## 2014-06-12 MED ORDER — INSULIN GLARGINE 100 UNIT/ML ~~LOC~~ SOLN: 20.0000 [IU] | Freq: Every day | SUBCUTANEOUS | Status: DC

## 2014-06-28 ENCOUNTER — Encounter: Payer: Self-pay | Admitting: *Deleted

## 2014-06-30 NOTE — Telephone Encounter (Signed)
Mailed unread message to pt  

## 2014-07-14 ENCOUNTER — Ambulatory Visit: Payer: BC Managed Care – PPO | Admitting: Adult Health

## 2014-07-20 ENCOUNTER — Ambulatory Visit (INDEPENDENT_AMBULATORY_CARE_PROVIDER_SITE_OTHER): Payer: BC Managed Care – PPO | Admitting: Adult Health

## 2014-07-20 ENCOUNTER — Encounter: Payer: Self-pay | Admitting: Adult Health

## 2014-07-20 VITALS — BP 116/81 | HR 97 | Temp 98.4°F | Resp 14 | Ht 63.0 in | Wt 140.0 lb

## 2014-07-20 DIAGNOSIS — F17200 Nicotine dependence, unspecified, uncomplicated: Secondary | ICD-10-CM

## 2014-07-20 DIAGNOSIS — IMO0001 Reserved for inherently not codable concepts without codable children: Secondary | ICD-10-CM

## 2014-07-20 DIAGNOSIS — E1165 Type 2 diabetes mellitus with hyperglycemia: Secondary | ICD-10-CM

## 2014-07-20 DIAGNOSIS — IMO0002 Reserved for concepts with insufficient information to code with codable children: Secondary | ICD-10-CM

## 2014-07-20 DIAGNOSIS — Z716 Tobacco abuse counseling: Secondary | ICD-10-CM

## 2014-07-20 DIAGNOSIS — E1065 Type 1 diabetes mellitus with hyperglycemia: Secondary | ICD-10-CM | POA: Insufficient documentation

## 2014-07-20 DIAGNOSIS — L0293 Carbuncle, unspecified: Secondary | ICD-10-CM

## 2014-07-20 DIAGNOSIS — Z1239 Encounter for other screening for malignant neoplasm of breast: Secondary | ICD-10-CM

## 2014-07-20 DIAGNOSIS — Z23 Encounter for immunization: Secondary | ICD-10-CM

## 2014-07-20 DIAGNOSIS — Z7189 Other specified counseling: Secondary | ICD-10-CM

## 2014-07-20 DIAGNOSIS — L0292 Furuncle, unspecified: Secondary | ICD-10-CM

## 2014-07-20 LAB — HEMOGLOBIN A1C: Hgb A1c MFr Bld: 9 % — ABNORMAL HIGH (ref 4.6–6.5)

## 2014-07-20 LAB — MICROALBUMIN / CREATININE URINE RATIO
Creatinine,U: 31.5 mg/dL
MICROALB/CREAT RATIO: 1 mg/g (ref 0.0–30.0)
Microalb, Ur: 0.3 mg/dL (ref 0.0–1.9)

## 2014-07-20 MED ORDER — DOXYCYCLINE HYCLATE 100 MG PO TABS: 100.0000 mg | ORAL_TABLET | Freq: Two times a day (BID) | ORAL | Status: DC

## 2014-07-20 MED ORDER — VARENICLINE TARTRATE 0.5 MG X 11 & 1 MG X 42 PO MISC: ORAL | Status: DC

## 2014-07-20 NOTE — Patient Instructions (Signed)
  Start doxycycline 100 mg twice a day for 10 days. You can try an antibacterial soap to see if this helps prevent future boils.  Please have labs drawn prior to leaving the office. We will contact you with results along with additional instructions.  See Mary Haney prior to leaving the office to schedule your mammogram.  Remember to call and schedule your diabetic eye exam as soon as possible.  You received your tetanus vaccine today. This is good for 10 years. Please schedule a nurse visit to have your pneumonia vaccine within the next one to 2 months.  I have sent in a prescription for Chantix. It is the starter kit with instructions on how to begin. Please let me know if this is working for you and I will send in refills.

## 2014-07-20 NOTE — Addendum Note (Signed)
Addended by: Kerin Salen R on: 07/20/2014 10:00 AM   Modules accepted: Orders

## 2014-07-20 NOTE — Progress Notes (Signed)
Pre visit review using our clinic review tool, if applicable. No additional management support is needed unless otherwise documented below in the visit note. 

## 2014-07-20 NOTE — Addendum Note (Signed)
Addended by: Geni Bers on: 07/20/2014 08:49 AM   Modules accepted: Orders

## 2014-07-20 NOTE — Progress Notes (Signed)
Patient ID: Mary Haney, female   DOB: 1971/08/18, 43 y.o.   MRN: [id number]    Subjective:    Patient ID: Mary Haney, female    DOB: 13-Aug-1971, 43 y.o.   MRN: [id number]  HPI  Pt is a pleasant 43 y/o female with hx of uncontrolled diabetes who presents with concerns about facial swelling and boils on groin area. She shaves genital area/perineum. Reports that she uses an Copy and has not really noticed getting nicked or having a hair ingrown. She reports having other boils that have "ruptured and gone away". No fever or chills reported.  She will have her diabetic foot exam today while in clinic. Advised that she needs to schedule her yearly diabetic eye exam. She reports the last time she had an eye exam was about 2 years ago.  Nimo is requesting assistance with smoking cessation. She has tried nicotine patches and gum without any success. She has had a prescription for Chantix in the past but never took the medication. She would like to try this.  Patient has not had a mammogram. An order has been placed back in January but she never scheduled to have it done.  She has not had a tetanus vaccine in a very long time.  Past Medical History  Diagnosis Date  . Asthma     Has albuterol inhaler  . Diabetes mellitus without complication     Type 2 diagnosed 2013    Current Outpatient Prescriptions on File Prior to Visit  Medication Sig Dispense Refill  . albuterol (PROVENTIL HFA;VENTOLIN HFA) 108 (90 BASE) MCG/ACT inhaler Inhale 2 puffs into the lungs every 6 (six) hours as needed for wheezing.      . Blood Glucose Monitoring Suppl (BLOOD GLUCOSE METER) kit Use as instructed  1 each  0  . cetirizine (ZYRTEC) 10 MG tablet Take 10 mg by mouth daily.      . chlorhexidine (PERIDEX) 0.12 % solution Use as directed 15 mLs in the mouth or throat 2 (two) times daily.  120 mL  0  . glucagon (GLUCAGON EMERGENCY) 1 MG injection Inject 1 mg into the muscle once as needed.  2 each   prn  . insulin aspart (NOVOLOG FLEXPEN) 100 UNIT/ML FlexPen Inject 3 Units into the skin 3 (three) times daily with meals. 1 unit with snacks + sliding scale  15 mL  2  . insulin glargine (LANTUS) 100 UNIT/ML injection Inject 0.2 mLs (20 Units total) into the skin at bedtime.  10 mL  6  . triamcinolone ointment (KENALOG) 0.1 %        No current facility-administered medications on file prior to visit.     Review of Systems  Constitutional: Negative.  Negative for fever and chills.  HENT: Negative.   Eyes: Negative.   Respiratory: Negative.        Wants assistance with smoking cessation  Cardiovascular: Negative.   Gastrointestinal: Negative.   Endocrine: Negative.   Genitourinary:       Boil on genitalia - painful to touch  Musculoskeletal: Negative.   Skin: Negative.   Allergic/Immunologic: Negative.   Neurological: Negative.   Hematological: Negative.   Psychiatric/Behavioral: Negative.        Objective:  BP 116/81  Pulse 97  Temp(Src) 98.4 F (36.9 C) (Oral)  Resp 14  Wt 140 lb (63.504 kg)  SpO2 100%   Physical Exam  Constitutional: She is oriented to person, place, and time. She appears well-developed and well-nourished. No distress.  Cardiovascular: Normal rate and regular rhythm.   Pulmonary/Chest: Effort normal. No respiratory distress.  Genitourinary:     Musculoskeletal: Normal range of motion.  Neurological: She is alert and oriented to person, place, and time.  Skin: Skin is warm and dry.  Psychiatric: She has a normal mood and affect. Her behavior is normal. Judgment and thought content normal.          Assessment & Plan:   1. Boil Small area on left labia majora that is swollen and painful to touch. Discussed making sure electric razor is clean prior to each use. Try a gentle anti-bacteria soap to see if this improves the instances of boils. Start Doxycycline. - doxycycline (VIBRA-TABS) 100 MG tablet; Take 1 tablet (100 mg total) by mouth 2  (two) times daily.  Dispense: 20 tablet; Refill: 0  2. Diabetes type 2, uncontrolled Check labs. She is on insulin. - Hemoglobin A1c; Future - Microalbumin, urine  3. Encounter for smoking cessation counseling Discussed Chantix and how medication works, side effects. Prescription given to pt.  - varenicline (CHANTIX STARTING MONTH PAK) 0.5 MG X 11 & 1 MG X 42 tablet; Take one 0.5 mg tablet by mouth once daily for 3 days, then increase to one 0.5 mg tablet twice daily for 4 days, then increase to one 1 mg tablet twice daily.  Dispense: 53 tablet; Refill: 0  4. Need for tetanus booster Given in clinic today  5. Screening for breast cancer Scheduled prior to leaving clinic today - MM DIGITAL SCREENING BILATERAL; Future

## 2014-07-20 NOTE — Addendum Note (Signed)
Addended by: Nanci Pina on: 07/20/2014 09:01 AM   Modules accepted: Orders

## 2014-07-21 ENCOUNTER — Encounter: Payer: Self-pay | Admitting: Adult Health

## 2014-07-24 NOTE — Telephone Encounter (Signed)
Called and left message, notifying pt of message and requested call back or response

## 2014-08-04 ENCOUNTER — Other Ambulatory Visit: Payer: Self-pay | Admitting: Adult Health

## 2014-08-04 DIAGNOSIS — Z716 Tobacco abuse counseling: Secondary | ICD-10-CM

## 2014-08-04 MED ORDER — VARENICLINE TARTRATE 0.5 MG X 11 & 1 MG X 42 PO MISC: ORAL | Status: DC

## 2014-08-04 NOTE — Telephone Encounter (Signed)
Have her increase lantus to 18 units. She needs to check her blood sugar levels in the morning. If persistently above 150 then have her call back for further adjustment. Her am fasting blood glucose levels need to be below 150.  I will reprint the chantix prescription and it will need to be faxed.

## 2014-08-04 NOTE — Telephone Encounter (Signed)
Pt called states 3 units on sliding scale between 120-170 is one unit.  171-220 is 2 units.  221-270 is 3 units. 271-320 is 4 units over 321 is 5 units.  Pt further states she takes Lantus 15 units at night.  Pt also reports her Rx for Chantix is not at [facility].  Please advise

## 2014-08-07 NOTE — Telephone Encounter (Deleted)
Sherryl Barters, NP at 08/04/2014 11:51 AM      Status: Signed            Have her increase lantus to 18 units. She needs to check her blood sugar levels in the morning. If persistently above 150 then have her call back for further adjustment. Her am fasting blood glucose levels need to be below 150.   I will reprint the chantix prescription and it will need to be faxed.

## 2014-08-07 NOTE — Telephone Encounter (Signed)
Spoke with pt advised of Mary Haney message

## 2014-09-19 ENCOUNTER — Ambulatory Visit (INDEPENDENT_AMBULATORY_CARE_PROVIDER_SITE_OTHER): Payer: BC Managed Care – PPO | Admitting: *Deleted

## 2014-09-19 DIAGNOSIS — Z23 Encounter for immunization: Secondary | ICD-10-CM

## 2014-09-19 NOTE — Progress Notes (Signed)
Patient denies any pregnancy at this time date of last menstrual 09/07/14.

## 2015-01-06 IMAGING — CR DG CERVICAL SPINE COMPLETE 4+V
5 series · 5 of 5 positions shown · non-contrast
Comparison: None.

CLINICAL DATA: Neck pain.

EXAM:
CERVICAL SPINE  4+ VIEWS

[view not recorded (1 of 5)]
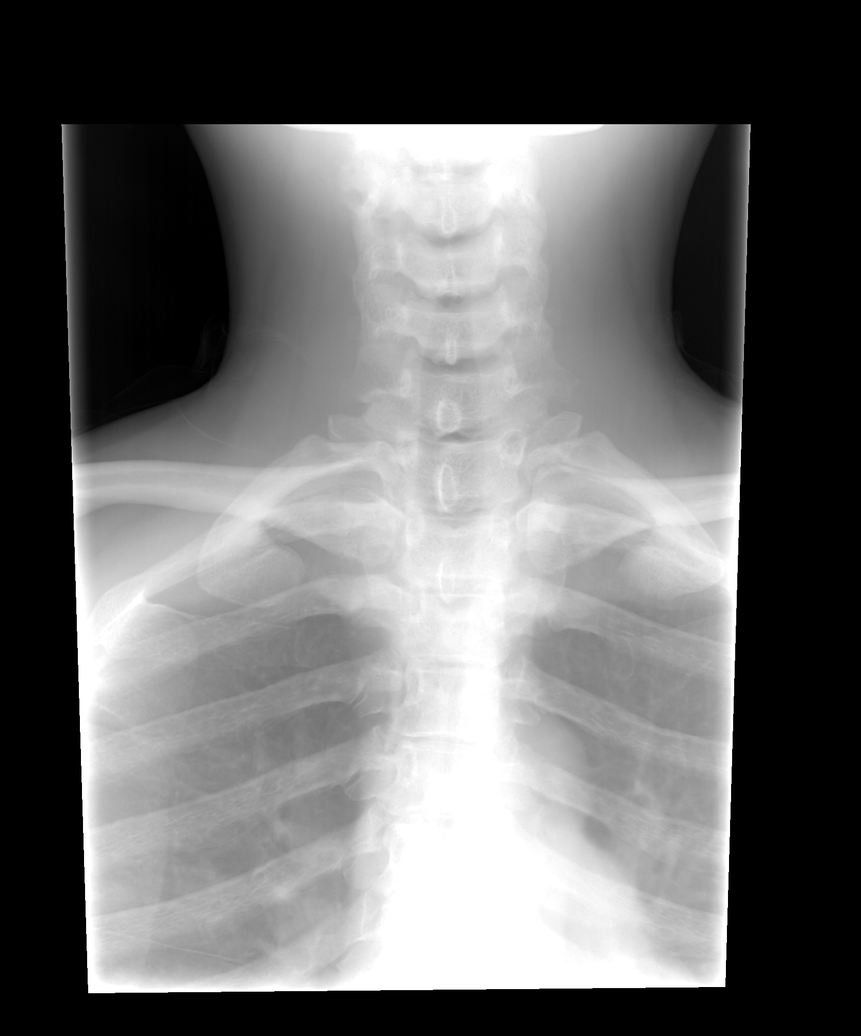

[view not recorded (2 of 5)]
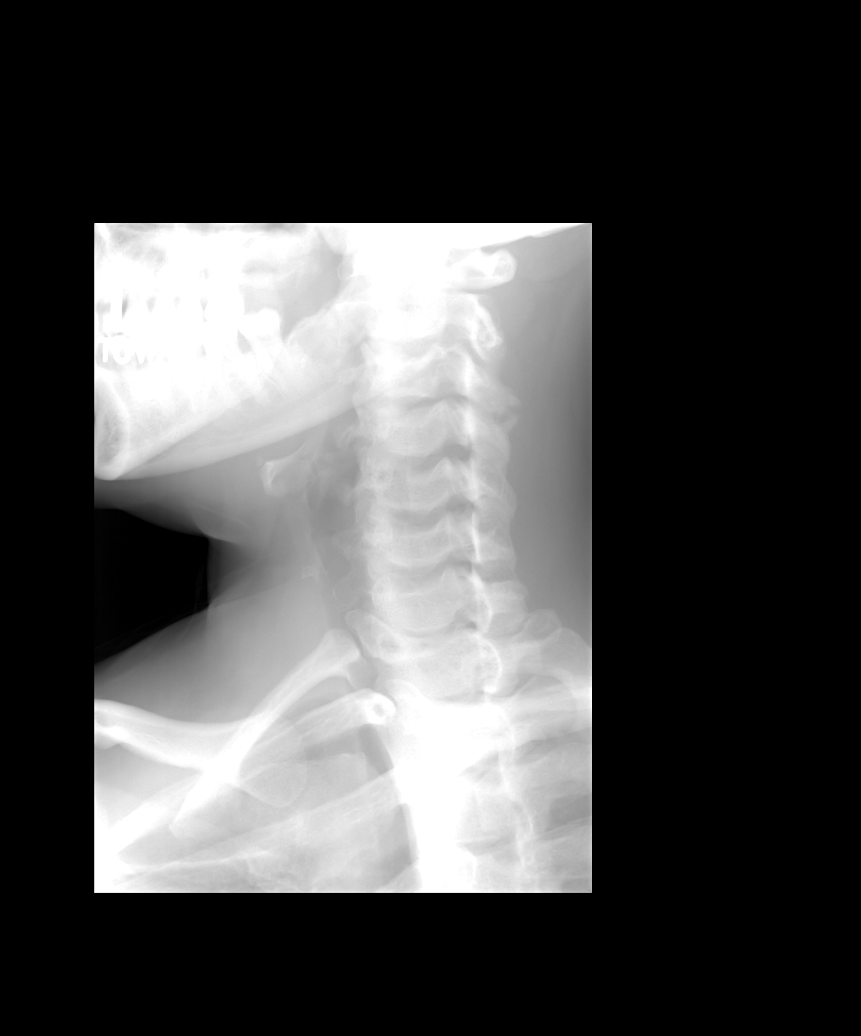

[view not recorded (3 of 5)]
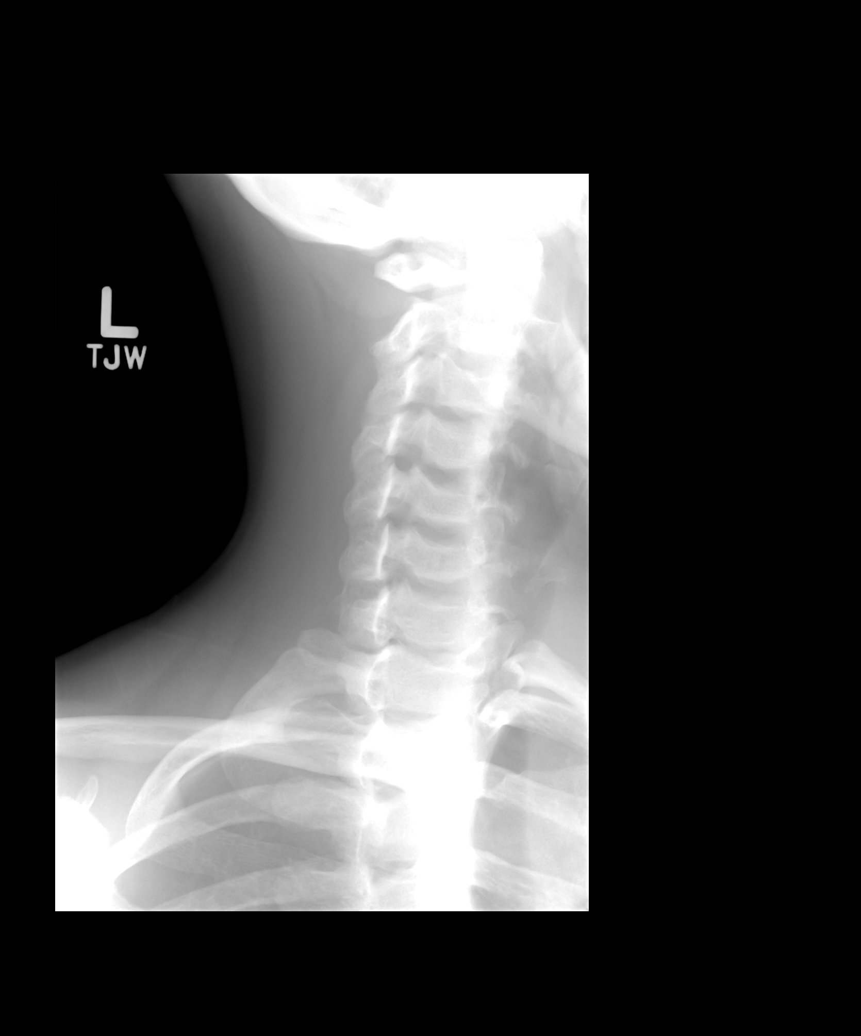

[view not recorded (4 of 5)]
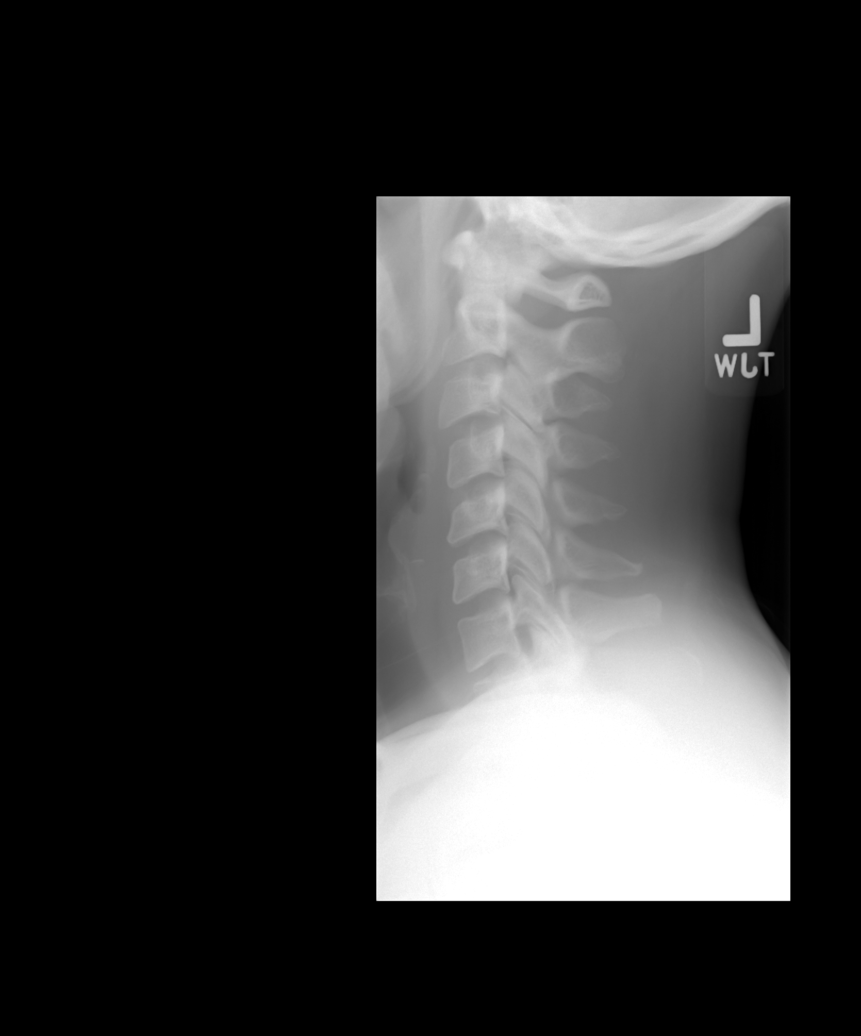

[view not recorded (5 of 5)]
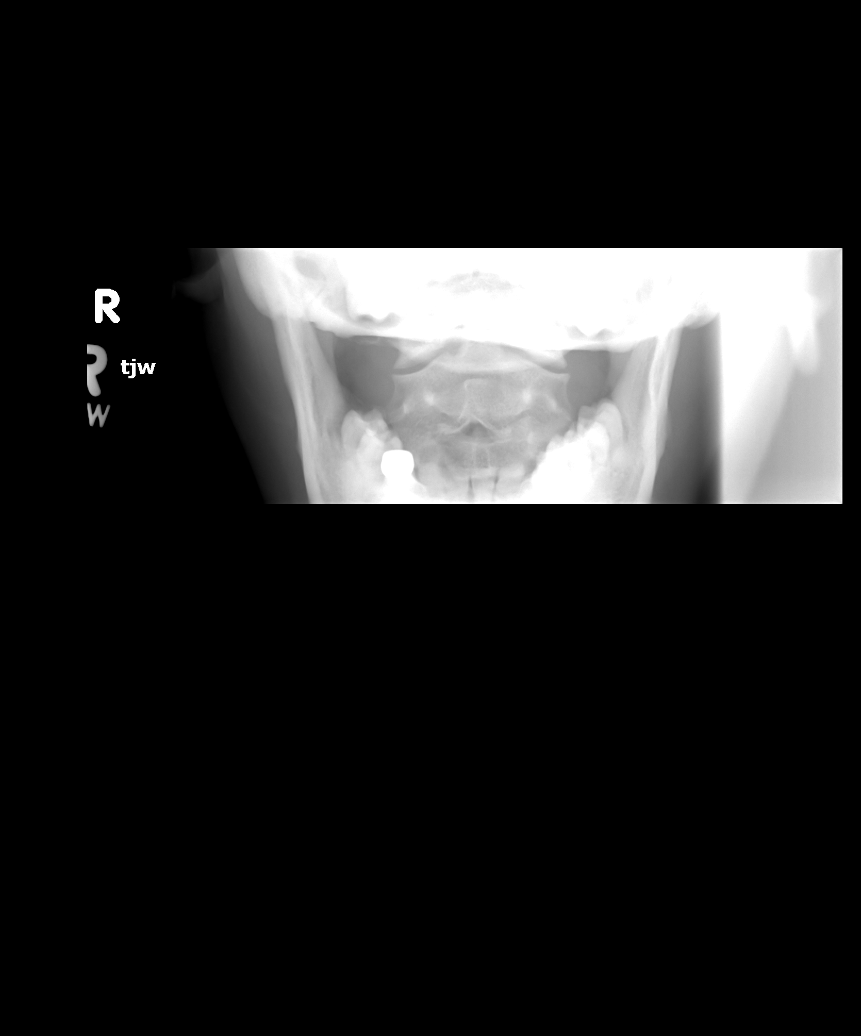

[5 of 5 positions shown; findings below may reference images not displayed]

FINDINGS: Normal alignment of the cervical spine. Prevertebral soft tissues
are within normal limits. There is irregularity of the right facets
at C3-C4. There is hypertrophy and mild fragmentation of the right
C3-C4 facets on the oblique image. Otherwise, there is no gross
abnormality to the facets. The disc spaces are maintained.
IMPRESSION: Irregularity of the right C3-C4 facets, as described. Findings could
represent old trauma and/or facet degenerative disease. An acute
injury is thought to be less likely but recommend clinical
correlation.

## 2015-03-27 ENCOUNTER — Ambulatory Visit: Payer: Self-pay | Admitting: Nurse Practitioner

## 2015-04-01 NOTE — H&P (Signed)
PATIENT NAME:  Mary Haney, Mary Haney MR#:  [id number] DATE OF BIRTH:  1971-02-13  DATE OF ADMISSION:  06/12/2012  REFERRING PHYSICIAN:   Ferman Hamming, MD   PRIMARY CARE PHYSICIAN: None.   CHIEF COMPLAINT: Weakness.   HISTORY OF PRESENT ILLNESS: The patient is a pleasant 44 year old African American female with a history of well-controlled asthma and remote history of seizures and pancreatitis in the setting of alcohol, presents with about 5 to 6 days of excessive thirst and urination and weakness. This morning she felt very weak and was unable to get up. The patient has been drinking plenty of water and various juices to keep her hydrated. She denies having any nausea, vomiting, abdominal pain or chest pain. On arrival, she was noted to have blood sugars of 596 with anion gap metabolic acidosis, and pH was noted to be 7.24 with pCO2 of 25;  and she was given IV fluids and insulin drip has been ordered. The Hospitalist Service was contacted for further evaluation and management.   The patient states overall she is in good health. Over the last several months she has had some weight loss and although she good appetite states she is not gaining much weight. The patient states that overall she tends to be on the "thinner side."   PAST MEDICAL HISTORY:  1. Asthma which is well controlled, and she uses Ventolin once every several months.  2. Alcoholic pancreatitis.  3. 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   FAMILY HISTORY: Diabetes, glaucoma and asthma.   OUTPATIENT MEDICATIONS:  1. Ventolin 2 puffs every 4 hours p.r.n.  2. Benadryl p.r.n. 25 mg orally at bedtime as needed for insomnia.   ALLERGIES: No known drug allergies.   SOCIAL HISTORY: She smokes a pack a day. She quit alcohol about five weeks ago where she used to drink two 24-ounce cans of beer a day. No other drug use history. She works at [employer].    REVIEW OF SYSTEMS: CONSTITUTIONAL: No fever, generalized  weakness and some weight loss. EYES: No blurry vision or double vision. ENT: No tinnitus, hearing loss or postnasal drip. RESPIRATORY: Has a cough most mornings occasionally productive. No wheezing, hemoptysis, chronic obstructive pulmonary disease, or painful respiration. CARDIOVASCULAR: Denies chest pain, orthopnea, arrhythmia or high blood pressure history, no palpitations. GI: Denies nausea, vomiting, diarrhea, abdominal pain, rectal bleeding. GENITOURINARY: Denies dysuria. Has increased frequency. No incontinence. ENDOCRINE: Positive for polyuria or nocturia. Denies thyroid problems. Positive for thirst. HEMATOLOGIC/LYMPHATIC: Denies any anemia or easy bruising. SKIN: Denies any rashes. MUSCULOSKELETAL: Denies arthritis or gout. NEUROLOGIC: Denies weakness or numbness. No cerebrovascular accident or transient ischemic attack history. PSYCHIATRIC: Denies anxiety. Has some insomnia. No depression.   PHYSICAL EXAMINATION:  VITAL SIGNS: On arrival, there is no temperature noted, but the pulse is 102, respiratory rate 18, blood pressure 126/80. Oxygen saturation 100% on room air.   GENERAL: The patient is a pleasant, thin  female sitting in bed in no obvious distress.   HEENT: Normocephalic, atraumatic. Pupils are equal and reactive. Anicteric sclerae. Very dry mucous membranes. Positive for thrush.   NECK: Supple. No thyroid tenderness. No lymphadenopathy in the cervical and submandibular region. No JVD.   CARDIOVASCULAR: S1, S2, regular rate and rhythm. No murmurs, rubs, or gallops.   LUNGS: Clear to auscultation without wheezing, rhonchi, or rales.   ABDOMEN: Soft, nontender, no organomegaly noted. Positive bowel sounds in all quadrants.   EXTREMITIES: No significant lower extremity edema.   NEUROLOGICAL: Cranial  nerves II through XII are grossly intact. Strength is 5 out of 5 in all extremities. Sensation is intact to light touch.  SKIN: No obvious rashes.   PSYCHIATRIC: Awake, alert,  oriented x3. Pleasant, cooperative.   LABORATORY, DIAGNOSTIC AND RADIOLOGICAL DATA:  Initial glucose from the BMP was 596, last sugar 583, BUN 9, creatinine 0.83, sodium 129, potassium 4.4, serum CO2 was 15, anion gap of 19.  Ethanol negative, lipase 98, total protein 8.5, otherwise within normal limits.  Troponin negative. TSH 1.05. WBC 6, hemoglobin 14.5, hematocrit 44.4, platelets are 301.  Urinalysis 2+ ketones, greater than 500 glucose, no nitrites or leukocyte esterase, no bacteria. ABG: pH 7.24, pCO2 25, pO2 103.  EKG: Normal sinus rhythm, rate is 84. There are some nonspecific T wave changes. There are some T wave inversions in V3, possibly in V2. No ST elevations or depressions.   ASSESSMENT AND PLAN: We have a 44 year old pleasant African American female with a history of well-controlled asthma and a remote history of seizures in the setting of alcohol, who presents with:  1. Generalized weakness and was found to have significant anion gap metabolic acidosis and hyperglycemia, likely DKA new onset diabetes: At this point, we will admit the patient to the Critical Care Unit and start insulin drip per Critical Care Unit protocol and aggressively IV fluid resuscitate the patient. The patient would need careful blood glucose monitoring as well as electrolytes as well. I would order diabetes educator as well as a Insurance risk surveyor. The patient was thoroughly counseled on changing her dietary habits to exclude all the sweet drinks, candies and carbohydrate-rich diets. The patient does have significant anion gap of 19 and pH is 7.24. There are also ketones in the urine. I would also check a hemoglobin  A1c as well as lipid profile.  2. Hyponatremia. This likely is in the setting of hyperglycemia exacerbated by dehydration. She will be started on normal saline, and this will be rechecked in the morning.  3. Tobacco abuse: She was counseled for three minutes. At this point, she refuses a nicotine  patch. She states that she has tried them before and sometimes it can cause a rash. She states that she will try to wean her herself off of the tobacco.  4. Thrush: This is possibly secondary to above. The patient stated that she was tested last year for HIV. She is in a monogamous relationship. I will start her on nystatin swish and swallow.  5. Asthma: She appears to have a well-controlled asthma at her baseline. She is a smoker, however, and we discussed smoking cessation with her. I will resume her Ventolin. The patient stated she uses her Ventolin 2 puffs probably every 2 to 3 months.  6. I would start her on deep vein thrombosis prophylaxis with Lovenox.  CODE STATUS:  The patient is a FULL CODE.     TOTAL TIME SPENT: 50 minutes.   ____________________________ Vivien Presto, MD sa:cbb D: 06/12/2012 12:58:14 ET T: 06/12/2012 13:59:00 ET JOB#: [id number]  cc: Vivien Presto, MD, <Dictator> Vivien Presto MD ELECTRONICALLY SIGNED 06/28/2012 16:16

## 2015-04-01 NOTE — Discharge Summary (Signed)
PATIENT NAME:  Mary Haney, Mary Haney MR#:  [id number] DATE OF BIRTH:  07/18/1971  DATE OF ADMISSION:  06/12/2012 DATE OF DISCHARGE:  06/14/2012  PRIMARY CARE PHYSICIAN: None.  DISCHARGE DIAGNOSES:  1. Diabetic ketoacidosis. 2. Hypoglycemia. 3. Diabetes mellitus, insulin-dependent. 4. Hypokalemia. 5. Malnutrition.  ADMITTING STUDIES: Chest x-ray: No abnormalities.   ADMITTING HISTORY AND PHYSICAL: Please see the detailed History and Physical dictated previously.  In brief, this is a 44 year old female patient with history of asthma who was brought to the emergency room complaining of weakness and fatigue. The patient was found to be in DKA and admitted to the hospitalist service for further management.   HOSPITAL COURSE: The patient was admitted to the CCU on an insulin drip with which her DKA resolved quickly and was transitioned on to Lantus insulin and sliding scale, and on the day of discharge her blood sugars were excellently controlled, in the range of 147, 131, and 142, and the patient is being discharged home on Lantus insulin 24 units subcutaneous at bedtime and to followup with primary care physician.  The patient has been given a list of local doctors to followup with. She has also been given prescriptions for new medications along with Glucometer and Accu-Cheks and diabetes education.   The patient had significant hypokalemia during the hospital stay secondary to IV fluids and insulin which has resolved and prior to discharge her potassium is 3.5 and she will be on potassium supplements daily and will need followup potassium check with her primary care physician.   DISCHARGE MEDICATIONS: 1. Albuterol inhaler two puff inhalation every 4 hours p.r.n.  2. Potassium chloride 20 milliequivalents oral daily. 3. Insulin Lantus 24 units subcutaneous at bedtime daily. 4. Glucometer and Accu-Strips to check blood sugars before meals three times and at bedtime.  DISCHARGE INSTRUCTIONS: The  patient has been given instructions regarding hypoglycemic symptoms and will return to the emergency room if she has any further symptoms. She is to set up a followup appointment in 1 to 2 weeks with her primary care physician.  Diabetic diet. No restrictions in activity. This plan was discussed with the patient who is in agreement with the plan.   TIME SPENT: Time spent on discharge dictation, coordination of care, and counseling of the patient was 36 minutes.  ____________________________ Leia Alf Terris Bodin, MD srs:slb D: 06/14/2012 15:42:24 ET T: 06/15/2012 11:27:04 ET JOB#: [id number]  cc: Alveta Heimlich R. Darvin Neighbours, MD, <Dictator> Neita Carp MD ELECTRONICALLY SIGNED 06/18/2012 14:13

## 2015-04-03 ENCOUNTER — Ambulatory Visit (INDEPENDENT_AMBULATORY_CARE_PROVIDER_SITE_OTHER): Payer: BLUE CROSS/BLUE SHIELD | Admitting: Nurse Practitioner

## 2015-04-03 ENCOUNTER — Encounter: Payer: Self-pay | Admitting: Nurse Practitioner

## 2015-04-03 VITALS — BP 112/70 | HR 97 | Temp 98.1°F | Resp 14 | Ht 63.0 in | Wt 137.1 lb

## 2015-04-03 DIAGNOSIS — L259 Unspecified contact dermatitis, unspecified cause: Secondary | ICD-10-CM

## 2015-04-03 DIAGNOSIS — E1165 Type 2 diabetes mellitus with hyperglycemia: Secondary | ICD-10-CM

## 2015-04-03 DIAGNOSIS — Z720 Tobacco use: Secondary | ICD-10-CM | POA: Diagnosis not present

## 2015-04-03 DIAGNOSIS — IMO0002 Reserved for concepts with insufficient information to code with codable children: Secondary | ICD-10-CM

## 2015-04-03 DIAGNOSIS — L309 Dermatitis, unspecified: Secondary | ICD-10-CM

## 2015-04-03 LAB — CBC WITH DIFFERENTIAL/PLATELET
Basophils Absolute: 0.1 10*3/uL (ref 0.0–0.1)
Basophils Relative: 0.8 % (ref 0.0–3.0)
Eosinophils Absolute: 0 10*3/uL (ref 0.0–0.7)
Eosinophils Relative: 0.3 % (ref 0.0–5.0)
HCT: 39.5 % (ref 36.0–46.0)
HEMOGLOBIN: 13.4 g/dL (ref 12.0–15.0)
Lymphocytes Relative: 34.2 % (ref 12.0–46.0)
Lymphs Abs: 2.3 10*3/uL (ref 0.7–4.0)
MCHC: 33.8 g/dL (ref 30.0–36.0)
MCV: 90.6 fl (ref 78.0–100.0)
MONO ABS: 0.6 10*3/uL (ref 0.1–1.0)
MONOS PCT: 9.7 % (ref 3.0–12.0)
NEUTROS ABS: 3.6 10*3/uL (ref 1.4–7.7)
NEUTROS PCT: 55 % (ref 43.0–77.0)
Platelets: 395 10*3/uL (ref 150.0–400.0)
RBC: 4.36 Mil/uL (ref 3.87–5.11)
RDW: 13.8 % (ref 11.5–15.5)
WBC: 6.6 10*3/uL (ref 4.0–10.5)

## 2015-04-03 LAB — MICROALBUMIN / CREATININE URINE RATIO
CREATININE, U: 25.3 mg/dL
MICROALB/CREAT RATIO: 2.8 mg/g (ref 0.0–30.0)

## 2015-04-03 LAB — HEMOGLOBIN A1C: HEMOGLOBIN A1C: 7.8 % — AB (ref 4.6–6.5)

## 2015-04-03 MED ORDER — TRIAMCINOLONE ACETONIDE 0.1 % EX OINT: TOPICAL_OINTMENT | Freq: Two times a day (BID) | CUTANEOUS | Status: DC

## 2015-04-03 MED ORDER — ALBUTEROL SULFATE HFA 108 (90 BASE) MCG/ACT IN AERS: 2.0000 | INHALATION_SPRAY | Freq: Four times a day (QID) | RESPIRATORY_TRACT | Status: DC | PRN

## 2015-04-03 MED ORDER — INSULIN ASPART 100 UNIT/ML FLEXPEN: PEN_INJECTOR | SUBCUTANEOUS | Status: DC

## 2015-04-03 NOTE — Patient Instructions (Signed)
Make sure you schedule your eye exam and an annual physical (fasting- nothing to eat or drink after midnight or we can do labs before hand).   Sent albuterol, Novolog pen, and ointment to the pharmacy.   Smoking Cessation Quitting smoking is important to your health and has many advantages. However, it is not always easy to quit since nicotine is a very addictive drug. Oftentimes, people try 3 times or more before being able to quit. This document explains the best ways for you to prepare to quit smoking. Quitting takes hard work and a lot of effort, but you can do it. ADVANTAGES OF QUITTING SMOKING  You will live longer, feel better, and live better.  Your body will feel the impact of quitting smoking almost immediately.  Within 20 minutes, blood pressure decreases. Your pulse returns to its normal level.  After 8 hours, carbon monoxide levels in the blood return to normal. Your oxygen level increases.  After 24 hours, the chance of having a heart attack starts to decrease. Your breath, hair, and body stop smelling like smoke.  After 48 hours, damaged nerve endings begin to recover. Your sense of taste and smell improve.  After 72 hours, the body is virtually free of nicotine. Your bronchial tubes relax and breathing becomes easier.  After 2 to 12 weeks, lungs can hold more air. Exercise becomes easier and circulation improves.  The risk of having a heart attack, stroke, cancer, or lung disease is greatly reduced.  After 1 year, the risk of coronary heart disease is cut in half.  After 5 years, the risk of stroke falls to the same as a nonsmoker.  After 10 years, the risk of lung cancer is cut in half and the risk of other cancers decreases significantly.  After 15 years, the risk of coronary heart disease drops, usually to the level of a nonsmoker.  If you are pregnant, quitting smoking will improve your chances of having a healthy baby.  The people you live with, especially any  children, will be healthier.  You will have extra money to spend on things other than cigarettes. QUESTIONS TO THINK ABOUT BEFORE ATTEMPTING TO QUIT You may want to talk about your answers with your health care provider.  Why do you want to quit?  If you tried to quit in the past, what helped and what did not?  What will be the most difficult situations for you after you quit? How will you plan to handle them?  Who can help you through the tough times? Your family? Friends? A health care provider?  What pleasures do you get from smoking? What ways can you still get pleasure if you quit? Here are some questions to ask your health care provider:  How can you help me to be successful at quitting?  What medicine do you think would be best for me and how should I take it?  What should I do if I need more help?  What is smoking withdrawal like? How can I get information on withdrawal? GET READY  Set a quit date.  Change your environment by getting rid of all cigarettes, ashtrays, matches, and lighters in your home, car, or work. Do not let people smoke in your home.  Review your past attempts to quit. Think about what worked and what did not. GET SUPPORT AND ENCOURAGEMENT You have a better chance of being successful if you have help. You can get support in many ways.  Tell your family,  friends, and coworkers that you are going to quit and need their support. Ask them not to smoke around you.  Get individual, group, or telephone counseling and support. Programs are available at [employer] and health centers. Call your local health department for information about programs in your area.  Spiritual beliefs and practices may help some smokers quit.  Download a "quit meter" on your computer to keep track of quit statistics, such as how long you have gone without smoking, cigarettes not smoked, and money saved.  Get a self-help book about quitting smoking and staying off  tobacco. [place] yourself from urges to smoke. Talk to someone, go for a walk, or occupy your time with a task.  Change your normal routine. Take a different route to work. Drink tea instead of coffee. Eat breakfast in a different place.  Reduce your stress. Take a hot bath, exercise, or read a book.  Plan something enjoyable to do every day. Reward yourself for not smoking.  Explore interactive web-based programs that specialize in helping you quit. GET MEDICINE AND USE IT CORRECTLY Medicines can help you stop smoking and decrease the urge to smoke. Combining medicine with the above behavioral methods and support can greatly increase your chances of successfully quitting smoking.  Nicotine replacement therapy helps deliver nicotine to your body without the negative effects and risks of smoking. Nicotine replacement therapy includes nicotine gum, lozenges, inhalers, nasal sprays, and skin patches. Some may be available over-the-counter and others require a prescription.  Antidepressant medicine helps people abstain from smoking, but how this works is unknown. This medicine is available by prescription.  Nicotinic receptor partial agonist medicine simulates the effect of nicotine in your brain. This medicine is available by prescription. Ask your health care provider for advice about which medicines to use and how to use them based on your health history. Your health care provider will tell you what side effects to look out for if you choose to be on a medicine or therapy. Carefully read the information on the package. Do not use any other product containing nicotine while using a nicotine replacement product.  RELAPSE OR DIFFICULT SITUATIONS Most relapses occur within the first 3 months after quitting. Do not be discouraged if you start smoking again. Remember, most people try several times before finally quitting. You may have symptoms of withdrawal because  your body is used to nicotine. You may crave cigarettes, be irritable, feel very hungry, cough often, get headaches, or have difficulty concentrating. The withdrawal symptoms are only temporary. They are strongest when you first quit, but they will go away within 10-14 days. To reduce the chances of relapse, try to:  Avoid drinking alcohol. Drinking lowers your chances of successfully quitting.  Reduce the amount of caffeine you consume. Once you quit smoking, the amount of caffeine in your body increases and can give you symptoms, such as a rapid heartbeat, sweating, and anxiety.  Avoid smokers because they can make you want to smoke.  Do not let weight gain distract you. Many smokers will gain weight when they quit, usually less than 10 pounds. Eat a healthy diet and stay active. You can always lose the weight gained after you quit.  Find ways to improve your mood other than smoking. FOR MORE INFORMATION  www.smokefree.gov  Document Released: 11/18/2001 Document Revised: 04/10/2014 Document Reviewed: 03/04/2012 [facility] Patient Information 2015 [place], [place]. This information is not intended to replace advice given to you by  your health care provider. Make sure you discuss any questions you have with your health care provider.  

## 2015-04-03 NOTE — Progress Notes (Signed)
Pre visit review using our clinic review tool, if applicable. No additional management support is needed unless otherwise documented below in the visit note. 

## 2015-04-03 NOTE — Progress Notes (Signed)
Subjective:    Patient ID: Mary Haney, female    DOB: May 14, 1971, 44 y.o.   MRN: [id number]  HPI  Mary Haney is a 44 yo female here for a follow up on her diabetes.   1) Diabetes-  58-270, took a glucose and peanut butter crackers   Diet- No formal   Exercise- No formal   Lantus- 18 units at night   Novolog 3 units each meal, sliding scale 120-170 1 additional unit   Eye exam- Scheduling for one   2) Smoking- Less than 1 pack, Not using the chantix yet  3) Using the ointment for eczema- used to keep hair falling out   Review of Systems  Constitutional: Negative for fever, chills, diaphoresis and fatigue.  Eyes: Negative for visual disturbance.  Respiratory: Negative for chest tightness, shortness of breath and wheezing.   Cardiovascular: Negative for chest pain, palpitations and leg swelling.  Gastrointestinal: Negative for nausea, vomiting and diarrhea.  Endocrine: Negative for polydipsia, polyphagia and polyuria.  Skin: Negative for rash.  Neurological: Negative for dizziness, weakness, numbness and headaches.   Past Medical History  Diagnosis Date  . Asthma     Has albuterol inhaler  . Diabetes mellitus without complication     Type 2 diagnosed 2013    History   Social History  . Marital Status: Single    Spouse Name: N/A  . Number of Children: 0  . Years of Education: 14   Occupational History  . Take Out Specialist     [employer]   Social History Main Topics  . Smoking status: Current Every Day Smoker -- 0.50 packs/day for 25 years    Types: Cigarettes  . Smokeless tobacco: Never Used     Comment: Has Chantix  . Alcohol Use: 1.8 oz/week    3 Cans of beer per week  . Drug Use: No  . Sexual Activity: Yes    Birth Control/ Protection: Condom   Other Topics Concern  . Not on file   Social History Narrative   Mary Haney was born in [place], [place]. She moved with her family to [place] at age 53. She attended 2 years at [employer] and was majoring  in [place]. Her goal is to become a Optometrist in the [employer]. 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 Mary Haney lives with her boyfriend, [name]. They are considering getting married soon. She enjoys sketching outdoor scenery, cartoons and loves to read.    No past surgical history on file.  Family History  Problem Relation Age of Onset  . Diabetes Father   . Depression Paternal Grandmother   . Depression Paternal Grandfather   . Depression Cousin     No Known Allergies  Current Outpatient Prescriptions on File Prior to Visit  Medication Sig Dispense Refill  . Blood Glucose Monitoring Suppl (BLOOD GLUCOSE METER) kit Use as instructed 1 each 0  . cetirizine (ZYRTEC) 10 MG tablet Take 10 mg by mouth daily.    . chlorhexidine (PERIDEX) 0.12 % solution Use as directed 15 mLs in the mouth or throat 2 (two) times daily. 120 mL 0  . insulin glargine (LANTUS) 100 UNIT/ML injection Inject 0.2 mLs (20 Units total) into the skin at bedtime. 10 mL 6  . varenicline (CHANTIX STARTING MONTH PAK) 0.5 MG X 11 & 1 MG X 42 tablet Take one 0.5 mg tablet by mouth once daily for 3 days, then increase to one 0.5 mg  tablet twice daily for 4 days, then increase to one 1 mg tablet twice daily. (Patient not taking: Reported on 04/03/2015) 53 tablet 0   No current facility-administered medications on file prior to visit.      Objective:   Physical Exam  Constitutional: She is oriented to person, place, and time. She appears well-developed and well-nourished. No distress.  BP 112/70 mmHg  Pulse 97  Temp(Src) 98.1 F (36.7 C) (Oral)  Resp 14  Ht _0  (1.6 m)  Wt 137 lb 1.9 oz (62.197 kg)  BMI 24.30 kg/m2  SpO2 98%  LMP 03/27/2015 (Approximate)   HENT:  Head: Normocephalic and atraumatic.  Right Ear: External ear normal.  Left Ear: External ear normal.  Eyes: EOM are normal. Pupils are equal, round, and reactive to light. Right eye exhibits no discharge. Left  eye exhibits no discharge. No scleral icterus.  Cardiovascular: Normal rate, regular rhythm, normal heart sounds and intact distal pulses.  Exam reveals no gallop and no friction rub.   No murmur heard. Pulmonary/Chest: Effort normal and breath sounds normal. No respiratory distress. She has no wheezes. She has no rales. She exhibits no tenderness.  Neurological: She is alert and oriented to person, place, and time. No cranial nerve deficit. She exhibits normal muscle tone. Coordination normal.  Normal monofilament exam  Skin: Skin is warm and dry. No rash noted. She is not diaphoretic.  Psychiatric: She has a normal mood and affect. Her behavior is normal. Judgment and thought content normal.      Assessment & Plan:

## 2015-04-03 NOTE — Assessment & Plan Note (Signed)
Patient reports using kenalog for eczema spots.

## 2015-04-09 DIAGNOSIS — F17200 Nicotine dependence, unspecified, uncomplicated: Secondary | ICD-10-CM | POA: Insufficient documentation

## 2015-04-09 NOTE — Assessment & Plan Note (Deleted)
Obtain CBC w/ diff, A1c, and urine microalbumin to check status. Needs eye exam and encouraged this.

## 2015-04-09 NOTE — Assessment & Plan Note (Signed)
Gave handout with information about smoking cessation

## 2015-04-09 NOTE — Assessment & Plan Note (Signed)
Obtain CBC w/ diff, A1c, and urine microalbumin to check status. Needs eye exam and encouraged this.

## 2015-04-25 ENCOUNTER — Telehealth: Payer: Self-pay | Admitting: *Deleted

## 2015-04-25 NOTE — Telephone Encounter (Signed)
Pt coming tomorrow what labs and dx?

## 2015-04-26 ENCOUNTER — Other Ambulatory Visit (INDEPENDENT_AMBULATORY_CARE_PROVIDER_SITE_OTHER): Payer: BLUE CROSS/BLUE SHIELD

## 2015-04-26 DIAGNOSIS — E119 Type 2 diabetes mellitus without complications: Secondary | ICD-10-CM

## 2015-04-26 LAB — BASIC METABOLIC PANEL
BUN: 7 mg/dL (ref 6–23)
CHLORIDE: 99 meq/L (ref 96–112)
CO2: 30 meq/L (ref 19–32)
CREATININE: 0.73 mg/dL (ref 0.40–1.20)
Calcium: 9.6 mg/dL (ref 8.4–10.5)
GFR: 112.15 mL/min (ref >60.00)
Glucose, Bld: 263 mg/dL — ABNORMAL HIGH (ref 70–99)
Potassium: 4.9 mEq/L (ref 3.5–5.1)
Sodium: 133 mEq/L — ABNORMAL LOW (ref 135–145)

## 2015-04-26 LAB — LIPID PANEL
CHOLESTEROL: 159 mg/dL (ref 0–200)
HDL: 79.7 mg/dL (ref >39.00)
LDL Cholesterol: 68 mg/dL (ref 0–99)
NonHDL: 79.3
TRIGLYCERIDES: 59 mg/dL (ref 0.0–149.0)
Total CHOL/HDL Ratio: 2
VLDL: 11.8 mg/dL (ref 0.0–40.0)

## 2015-04-26 NOTE — Telephone Encounter (Signed)
Dx- type 2 diabetes, order: Lipid profile and BMET. Thanks!

## 2015-05-03 ENCOUNTER — Ambulatory Visit (INDEPENDENT_AMBULATORY_CARE_PROVIDER_SITE_OTHER): Payer: BLUE CROSS/BLUE SHIELD | Admitting: Nurse Practitioner

## 2015-05-03 ENCOUNTER — Other Ambulatory Visit (HOSPITAL_COMMUNITY)
Admission: RE | Admit: 2015-05-03 | Discharge: 2015-05-03 | Disposition: A | Discharge status: Home / Self Care | Payer: BLUE CROSS/BLUE SHIELD | Source: Ambulatory Visit | Attending: Nurse Practitioner | Admitting: Nurse Practitioner

## 2015-05-03 ENCOUNTER — Encounter: Payer: Self-pay | Admitting: Nurse Practitioner

## 2015-05-03 VITALS — BP 102/62 | HR 100 | Temp 98.4°F | Resp 12 | Ht 63.0 in | Wt 135.8 lb

## 2015-05-03 DIAGNOSIS — Z720 Tobacco use: Secondary | ICD-10-CM

## 2015-05-03 DIAGNOSIS — Z1239 Encounter for other screening for malignant neoplasm of breast: Secondary | ICD-10-CM

## 2015-05-03 DIAGNOSIS — Z0000 Encounter for general adult medical examination without abnormal findings: Secondary | ICD-10-CM | POA: Diagnosis not present

## 2015-05-03 DIAGNOSIS — Z01419 Encounter for gynecological examination (general) (routine) without abnormal findings: Secondary | ICD-10-CM | POA: Insufficient documentation

## 2015-05-03 DIAGNOSIS — Z1151 Encounter for screening for human papillomavirus (HPV): Secondary | ICD-10-CM | POA: Diagnosis present

## 2015-05-03 NOTE — Progress Notes (Signed)
Pre visit review using our clinic review tool, if applicable. No additional management support is needed unless otherwise documented below in the visit note. 

## 2015-05-03 NOTE — Assessment & Plan Note (Signed)
PAP and breast exam performed today. Referral to mammogram placed. No concerns today.

## 2015-05-03 NOTE — Progress Notes (Signed)
Subjective:    Patient ID: Mary Haney, female    DOB: [birth year], 44 y.o.   MRN: [id number]  HPI  Mary Haney is a 44 yo female here for her annual physical.   1) Health Maintenance-   Diet- Eats small meals irregularly (only when she is hungry)  Exercise- No formal   Immunizations- UTD  Mammogram- Needs today   Pap- Needs today  Eye Exam- Scheduled this month  Dental Exam-UTD  2) Chronic Problems-  Asthma- No concerns   Diabetes- Occasional lows (not checking, but feels signs and takes a glucose tablet)   Smoking- Working on Engineer, site to stop smoking                 Review of Systems  Constitutional: Negative for fever, chills, diaphoresis, fatigue and unexpected weight change.  HENT: Negative for tinnitus and trouble swallowing.   Eyes: Negative for visual disturbance.  Respiratory: Negative for cough, chest tightness and stridor.   Cardiovascular: Negative for chest pain, palpitations and leg swelling.  Gastrointestinal: Negative for nausea, vomiting, abdominal pain, diarrhea, constipation and blood in stool.  Endocrine: Negative for polydipsia, polyphagia and polyuria.  Genitourinary: Negative for dysuria, hematuria, vaginal discharge and vaginal pain.  Musculoskeletal: Negative for myalgias, back pain, arthralgias and gait problem.  Skin: Negative for color change and rash.  Neurological: Negative for dizziness, weakness, numbness and headaches.  Hematological: Does not bruise/bleed easily.  Psychiatric/Behavioral: Negative for suicidal ideas and sleep disturbance. The patient is not nervous/anxious.    Past Medical History  Diagnosis Date  . Asthma     Has albuterol inhaler  . Diabetes mellitus without complication     Type 2 diagnosed 2013    History   Social History  . Marital Status: Single    Spouse Name: N/A  . Number of Children: 0  . Years of Education: 14   Occupational History  . Take Out Specialist     [employer]   Social  History Main Topics  . Smoking status: Current Every Day Smoker -- 0.50 packs/day for 25 years    Types: Cigarettes  . Smokeless tobacco: Never Used     Comment: Has Chantix  . Alcohol Use: 1.8 oz/week    3 Cans of beer per week  . Drug Use: No  . Sexual Activity: Yes    Birth Control/ Protection: Condom   Other Topics Concern  . Not on file   Social History Narrative   Mary Haney was born in [place], [place]. She moved with her family to [place] at age 56. She attended 2 years at [employer] and was majoring in [place]. Her goal is to become a Optometrist in the [employer]. 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 Mary Haney lives with her boyfriend, [name]. They are considering getting married soon. She enjoys sketching outdoor scenery, cartoons and loves to read.    No past surgical history on file.  Family History  Problem Relation Age of Onset  . Diabetes Father   . Depression Paternal Grandmother   . Depression Paternal Grandfather   . Depression Cousin     No Known Allergies  Current Outpatient Prescriptions on File Prior to Visit  Medication Sig Dispense Refill  . albuterol (PROAIR HFA) 108 (90 BASE) MCG/ACT inhaler Inhale 2 puffs into the lungs every 6 (six) hours as needed for wheezing or shortness of breath. 1 Inhaler 1  . Blood Glucose Monitoring Suppl (BLOOD GLUCOSE  METER) kit Use as instructed 1 each 0  . cetirizine (ZYRTEC) 10 MG tablet Take 10 mg by mouth daily.    . chlorhexidine (PERIDEX) 0.12 % solution Use as directed 15 mLs in the mouth or throat 2 (two) times daily. 120 mL 0  . insulin aspart (NOVOLOG FLEXPEN) 100 UNIT/ML FlexPen Inject 3 Units into the skin 3 (three) times daily with meals. 1 unit with snacks + sliding scale 15 mL 2  . insulin glargine (LANTUS) 100 UNIT/ML injection Inject 0.2 mLs (20 Units total) into the skin at bedtime. 10 mL 6  . triamcinolone ointment (KENALOG) 0.1 % Apply topically 2 (two) times daily. 30  g 1  . varenicline (CHANTIX STARTING MONTH PAK) 0.5 MG X 11 & 1 MG X 42 tablet Take one 0.5 mg tablet by mouth once daily for 3 days, then increase to one 0.5 mg tablet twice daily for 4 days, then increase to one 1 mg tablet twice daily. 53 tablet 0   No current facility-administered medications on file prior to visit.       Objective:   Physical Exam  Constitutional: She is oriented to person, place, and time. She appears well-developed and well-nourished. No distress.  BP 102/62 mmHg  Pulse 100  Temp(Src) 98.4 F (36.9 C) (Oral)  Resp 12  Ht _0  (1.6 m)  Wt 135 lb 12.8 oz (61.598 kg)  BMI 24.06 kg/m2  SpO2 98%  LMP 03/27/2015 (Approximate)   HENT:  Head: Normocephalic and atraumatic.  Right Ear: External ear normal.  Left Ear: External ear normal.  Eyes: EOM are normal. Pupils are equal, round, and reactive to light. Right eye exhibits no discharge. Left eye exhibits no discharge. No scleral icterus.  Neck: Normal range of motion. Neck supple. No thyromegaly present.  Cardiovascular: Normal rate, regular rhythm, normal heart sounds and intact distal pulses.  Exam reveals no gallop and no friction rub.   No murmur heard. Pulmonary/Chest: Effort normal and breath sounds normal. No respiratory distress. She has no wheezes. She has no rales. She exhibits no tenderness.  Breast Exam- no significant findings, hair growth noted on breasts and chest.   Abdominal: Soft. Bowel sounds are normal. She exhibits no distension and no mass. There is no tenderness. There is no rebound and no guarding.  Genitourinary: Vaginal discharge found.  White thin discharge  Musculoskeletal: Normal range of motion. She exhibits no edema or tenderness.  Lymphadenopathy:    She has no cervical adenopathy.  Neurological: She is alert and oriented to person, place, and time. No cranial nerve deficit. She exhibits normal muscle tone. Coordination normal.  Skin: Skin is warm and dry. No rash noted. She is  not diaphoretic.  Psychiatric: She has a normal mood and affect. Her behavior is normal. Judgment and thought content normal.      Assessment & Plan:

## 2015-05-03 NOTE — Assessment & Plan Note (Signed)
Discussed acute and chronic issues. Reviewed health maintenance measures, PFSHx, and immunizations. Routine labs already completed in the last few months.   Referral for mammogram placed.

## 2015-05-03 NOTE — Patient Instructions (Signed)

## 2015-05-03 NOTE — Addendum Note (Signed)
Addended by: Karlene Einstein D on: 05/03/2015 03:03 PM   Modules accepted: Orders

## 2015-05-03 NOTE — Assessment & Plan Note (Signed)
Still trying to mentally prepare for stopping smoking. Pt has Chantix.

## 2015-05-08 ENCOUNTER — Other Ambulatory Visit: Payer: Self-pay | Admitting: Nurse Practitioner

## 2015-05-08 LAB — CYTOLOGY - PAP

## 2015-05-08 MED ORDER — FLUCONAZOLE 150 MG PO TABS: 150.0000 mg | ORAL_TABLET | Freq: Once | ORAL | Status: DC

## 2015-06-06 ENCOUNTER — Ambulatory Visit: Payer: BLUE CROSS/BLUE SHIELD | Attending: Adult Health

## 2015-06-27 ENCOUNTER — Other Ambulatory Visit: Payer: Self-pay

## 2015-06-27 MED ORDER — INSULIN GLARGINE 100 UNIT/ML ~~LOC~~ SOLN: 20.0000 [IU] | Freq: Every day | SUBCUTANEOUS | Status: DC

## 2015-09-18 ENCOUNTER — Encounter: Payer: Self-pay | Admitting: Nurse Practitioner

## 2015-09-18 ENCOUNTER — Ambulatory Visit (INDEPENDENT_AMBULATORY_CARE_PROVIDER_SITE_OTHER): Payer: BLUE CROSS/BLUE SHIELD | Admitting: Nurse Practitioner

## 2015-09-18 VITALS — BP 128/92 | HR 92 | Temp 98.3°F | Resp 12 | Ht 63.0 in | Wt 131.0 lb

## 2015-09-18 DIAGNOSIS — N760 Acute vaginitis: Secondary | ICD-10-CM | POA: Diagnosis not present

## 2015-09-18 DIAGNOSIS — Z720 Tobacco use: Secondary | ICD-10-CM

## 2015-09-18 MED ORDER — NICOTINE 21 MG/24HR TD PT24: 21.0000 mg | MEDICATED_PATCH | Freq: Every day | TRANSDERMAL | Status: DC

## 2015-09-18 MED ORDER — BUPROPION HCL ER (XL) 150 MG PO TB24: 150.0000 mg | ORAL_TABLET | Freq: Every day | ORAL | Status: DC

## 2015-09-18 NOTE — Progress Notes (Signed)
Patient ID: Mary Haney, female    DOB: 08/10/1971  Age: 44 y.o. MRN: [id number]  CC: Vaginal Itching and Medication Management   HPI Mary Haney presents for CC of vaginal itching and medication management.   1) Chantix- nightmares- stopped taking, used patches.   2) Vaginal pruritis- around menstrual cycles  Vagasil [facility] brand not helpful  Patient denies vaginal discharge or odor. Is finishing up her menses today.  History Mary Haney has a past medical history of Asthma and Diabetes mellitus without complication ([place]).   She has no past surgical history on file.   Her family history includes Depression in her cousin, paternal grandfather, and paternal grandmother; Diabetes in her father.She reports that she has been smoking Cigarettes.  She has a 12.5 pack-year smoking history. She has never used smokeless tobacco. She reports that she drinks about 1.8 oz of alcohol per week. She reports that she does not use illicit drugs.  Outpatient Prescriptions Prior to Visit  Medication Sig Dispense Refill  . albuterol (PROAIR HFA) 108 (90 BASE) MCG/ACT inhaler Inhale 2 puffs into the lungs every 6 (six) hours as needed for wheezing or shortness of breath. 1 Inhaler 1  . Blood Glucose Monitoring Suppl (BLOOD GLUCOSE METER) kit Use as instructed 1 each 0  . cetirizine (ZYRTEC) 10 MG tablet Take 10 mg by mouth daily.    . chlorhexidine (PERIDEX) 0.12 % solution Use as directed 15 mLs in the mouth or throat 2 (two) times daily. 120 mL 0  . insulin aspart (NOVOLOG FLEXPEN) 100 UNIT/ML FlexPen Inject 3 Units into the skin 3 (three) times daily with meals. 1 unit with snacks + sliding scale 15 mL 2  . insulin glargine (LANTUS) 100 UNIT/ML injection Inject 0.2 mLs (20 Units total) into the skin at bedtime. 10 mL 6  . triamcinolone ointment (KENALOG) 0.1 % Apply topically 2 (two) times daily. 30 g 1  . fluconazole (DIFLUCAN) 150 MG tablet Take 1 tablet (150 mg total) by mouth once. 1 tablet 0  .  varenicline (CHANTIX STARTING MONTH PAK) 0.5 MG X 11 & 1 MG X 42 tablet Take one 0.5 mg tablet by mouth once daily for 3 days, then increase to one 0.5 mg tablet twice daily for 4 days, then increase to one 1 mg tablet twice daily. (Patient not taking: Reported on 09/18/2015) 53 tablet 0   No facility-administered medications prior to visit.    ROS Review of Systems  Constitutional: Negative for fever, chills, diaphoresis and fatigue.  Genitourinary: Negative for dysuria, urgency, frequency, hematuria, flank pain, decreased urine volume, difficulty urinating, genital sores and pelvic pain.  Skin: Negative for rash.    Objective:  BP 128/92 mmHg  Pulse 92  Temp(Src) 98.3 F (36.8 C) (Oral)  Resp 12  Ht '5\' 3"'  (1.6 m)  Wt 131 lb (59.421 kg)  BMI 23.21 kg/m2  SpO2 99%  Physical Exam  Constitutional: She is oriented to person, place, and time. She appears well-developed and well-nourished. No distress.  HENT:  Head: Normocephalic and atraumatic.  Right Ear: External ear normal.  Left Ear: External ear normal.  Genitourinary: No vaginal discharge found.  No wet prep obtained today per patient preference even after education.  Neurological: She is alert and oriented to person, place, and time. No cranial nerve deficit. She exhibits normal muscle tone. Coordination normal.  Skin: Skin is warm and dry. No rash noted. She is not diaphoretic.  Psychiatric: She has a normal mood and affect. Her behavior is  normal. Judgment and thought content normal.   Assessment & Plan:   Emmanuella was seen today for vaginal itching and medication management.  Diagnoses and all orders for this visit:  Tobacco use  Vaginitis and vulvovaginitis  Other orders -     nicotine (NICODERM CQ - DOSED IN MG/24 HOURS) 21 mg/24hr patch; Place 1 patch (21 mg total) onto the skin daily. -     buPROPion (WELLBUTRIN XL) 150 MG 24 hr tablet; Take 1 tablet (150 mg total) by mouth daily.  I have discontinued Ms.  Boulier's varenicline and fluconazole. I am also having her start on nicotine and buPROPion. Additionally, I am having her maintain her cetirizine, blood glucose meter kit and supplies, chlorhexidine, triamcinolone ointment, insulin aspart, albuterol, and insulin glargine.  Meds ordered this encounter  Medications  . nicotine (NICODERM CQ - DOSED IN MG/24 HOURS) 21 mg/24hr patch    Sig: Place 1 patch (21 mg total) onto the skin daily.    Dispense:  28 patch    Refill:  0    Order Specific Question:  Supervising Provider    Answer:  Deborra Medina L [[id number]]  . buPROPion (WELLBUTRIN XL) 150 MG 24 hr tablet    Sig: Take 1 tablet (150 mg total) by mouth daily.    Dispense:  30 tablet    Refill:  0    Order Specific Question:  Supervising Provider    Answer:  Crecencio Mc [[id number]]     Follow-up: Return if symptoms worsen or fail to improve.

## 2015-09-18 NOTE — Progress Notes (Signed)
Pre visit review using our clinic review tool, if applicable. No additional management support is needed unless otherwise documented below in the visit note. 

## 2015-09-18 NOTE — Patient Instructions (Signed)
Try Benadryl at night If not helpful Try a small amount of olive oil around the area If still not helpful we can get you back in for a swab.

## 2015-09-21 ENCOUNTER — Encounter: Payer: Self-pay | Admitting: Nurse Practitioner

## 2015-09-21 DIAGNOSIS — N760 Acute vaginitis: Secondary | ICD-10-CM | POA: Insufficient documentation

## 2015-09-21 NOTE — Assessment & Plan Note (Signed)
Patient did not want wet prep today. Asked patient to return to care if she would like one. No excoriation viewed on exam no vaginal discharge or odor. Asked patient to try a little bit of Olive Oil on the vulva to help with dryness that is perceived.

## 2015-09-21 NOTE — Assessment & Plan Note (Signed)
Chantix gave patient nightmares. So she has stopped this I have called in nicotine patches first step to her pharmacy. We'll follow-up

## 2016-01-31 ENCOUNTER — Other Ambulatory Visit: Payer: Self-pay

## 2016-01-31 MED ORDER — ALBUTEROL SULFATE HFA 108 (90 BASE) MCG/ACT IN AERS: 2.0000 | INHALATION_SPRAY | Freq: Four times a day (QID) | RESPIRATORY_TRACT | Status: DC | PRN

## 2016-02-08 ENCOUNTER — Ambulatory Visit (INDEPENDENT_AMBULATORY_CARE_PROVIDER_SITE_OTHER): Payer: BLUE CROSS/BLUE SHIELD | Admitting: Nurse Practitioner

## 2016-02-08 ENCOUNTER — Encounter: Payer: Self-pay | Admitting: Nurse Practitioner

## 2016-02-08 VITALS — BP 128/86 | HR 97 | Temp 98.2°F | Ht 63.0 in | Wt 123.5 lb

## 2016-02-08 DIAGNOSIS — IMO0001 Reserved for inherently not codable concepts without codable children: Secondary | ICD-10-CM

## 2016-02-08 DIAGNOSIS — Z794 Long term (current) use of insulin: Secondary | ICD-10-CM

## 2016-02-08 DIAGNOSIS — E1165 Type 2 diabetes mellitus with hyperglycemia: Secondary | ICD-10-CM

## 2016-02-08 DIAGNOSIS — Z720 Tobacco use: Secondary | ICD-10-CM | POA: Diagnosis not present

## 2016-02-08 DIAGNOSIS — J069 Acute upper respiratory infection, unspecified: Secondary | ICD-10-CM

## 2016-02-08 DIAGNOSIS — B370 Candidal stomatitis: Secondary | ICD-10-CM

## 2016-02-08 DIAGNOSIS — L02439 Carbuncle of limb, unspecified: Secondary | ICD-10-CM

## 2016-02-08 DIAGNOSIS — L02429 Furuncle of limb, unspecified: Secondary | ICD-10-CM

## 2016-02-08 MED ORDER — DOXYCYCLINE HYCLATE 100 MG PO TABS: 100.0000 mg | ORAL_TABLET | Freq: Two times a day (BID) | ORAL | Status: DC

## 2016-02-08 MED ORDER — FLUCONAZOLE 150 MG PO TABS: 150.0000 mg | ORAL_TABLET | Freq: Once | ORAL | Status: DC

## 2016-02-08 MED ORDER — HYDROCOD POLST-CPM POLST ER 10-8 MG/5ML PO SUER: 5.0000 mL | Freq: Every evening | ORAL | Status: DC | PRN

## 2016-02-08 MED ORDER — BUPROPION HCL ER (XL) 150 MG PO TB24: 150.0000 mg | ORAL_TABLET | Freq: Every day | ORAL | Status: DC

## 2016-02-08 MED ORDER — NICOTINE 21 MG/24HR TD PT24: 21.0000 mg | MEDICATED_PATCH | Freq: Every day | TRANSDERMAL | Status: DC

## 2016-02-08 NOTE — Patient Instructions (Signed)
See you in 1 month to follow up.   Take the antibiotic as directed  Take 1 diflucan now, 1 in three days and 1 after the antibiotic is finished.   Warm wet compresses for the bump in your armpit.

## 2016-02-08 NOTE — Progress Notes (Signed)
Patient ID: Mary Haney, female    DOB: 12-Feb-1971  Age: 45 y.o. MRN: [id number]  CC: Acute Visit   HPI Elfa Wooton presents for CC of cough x 1 week.   1) Cough- dry, cold/chills, phlegm- clear Denies fevers  Sore tongue  Treatment day:  Tylenol cold/flu severe  Delsym Chloraseptic   Sick contacts- Denies   2) Left arm- 1 week under arm, looks like a pustule   3) Insulin-  6 breakfast, 3 lunch, 3 dinner  BS running 200's-300   History Mary Haney has a past medical history of Asthma and Diabetes mellitus without complication ([place]).   She has no past surgical history on file.   Her family history includes Depression in her cousin, paternal grandfather, and paternal grandmother; Diabetes in her father.She reports that she has been smoking Cigarettes.  She has a 12.5 pack-year smoking history. She has never used smokeless tobacco. She reports that she drinks about 1.8 oz of alcohol per week. She reports that she does not use illicit drugs.  Outpatient Prescriptions Prior to Visit  Medication Sig Dispense Refill  . albuterol (PROAIR HFA) 108 (90 Base) MCG/ACT inhaler Inhale 2 puffs into the lungs every 6 (six) hours as needed for wheezing or shortness of breath. 1 Inhaler 1  . Blood Glucose Monitoring Suppl (BLOOD GLUCOSE METER) kit Use as instructed 1 each 0  . cetirizine (ZYRTEC) 10 MG tablet Take 10 mg by mouth daily.    . chlorhexidine (PERIDEX) 0.12 % solution Use as directed 15 mLs in the mouth or throat 2 (two) times daily. 120 mL 0  . insulin aspart (NOVOLOG FLEXPEN) 100 UNIT/ML FlexPen Inject 3 Units into the skin 3 (three) times daily with meals. 1 unit with snacks + sliding scale 15 mL 2  . insulin glargine (LANTUS) 100 UNIT/ML injection Inject 0.2 mLs (20 Units total) into the skin at bedtime. 10 mL 6  . triamcinolone ointment (KENALOG) 0.1 % Apply topically 2 (two) times daily. 30 g 1  . buPROPion (WELLBUTRIN XL) 150 MG 24 hr tablet Take 1 tablet (150 mg total) by  mouth daily. 30 tablet 0  . nicotine (NICODERM CQ - DOSED IN MG/24 HOURS) 21 mg/24hr patch Place 1 patch (21 mg total) onto the skin daily. 28 patch 0   No facility-administered medications prior to visit.    ROS Review of Systems  Constitutional: Positive for chills. Negative for fever, diaphoresis and fatigue.  HENT: Positive for congestion, rhinorrhea, sinus pressure and sore throat. Negative for ear pain, sneezing, trouble swallowing and voice change.   Eyes: Negative for visual disturbance.  Respiratory: Positive for cough. Negative for chest tightness, shortness of breath and wheezing.   Cardiovascular: Negative for chest pain, palpitations and leg swelling.  Gastrointestinal: Negative for nausea, vomiting and diarrhea.  Skin: Positive for wound. Negative for rash.  Neurological: Negative for dizziness and headaches.    Objective:  BP 128/86 mmHg  Pulse 97  Temp(Src) 98.2 F (36.8 C) (Oral)  Ht _0  (1.6 m)  Wt 123 lb 8 oz (56.019 kg)  BMI 21.88 kg/m2  SpO2 96%  Physical Exam  Constitutional: She is oriented to person, place, and time. She appears well-developed and well-nourished. No distress.  HENT:  Head: Normocephalic and atraumatic.  Right Ear: External ear normal.  Left Ear: External ear normal.  Mouth/Throat: Oropharynx is clear and moist. No oropharyngeal exudate.  Tongue has white thick coating   Eyes: EOM are normal. Pupils are equal, round, and reactive  to light. Right eye exhibits no discharge. Left eye exhibits no discharge. No scleral icterus.  Neck: Normal range of motion. Neck supple.  Cardiovascular: Normal rate, regular rhythm and normal heart sounds.  Exam reveals no gallop and no friction rub.   No murmur heard. Pulmonary/Chest: Effort normal and breath sounds normal. No respiratory distress. She has no wheezes. She has no rales. She exhibits no tenderness.  Lymphadenopathy:    She has no cervical adenopathy.  Neurological: She is alert and  oriented to person, place, and time. No cranial nerve deficit. She exhibits normal muscle tone. Coordination normal.  Skin: Skin is warm and dry. No rash noted. She is not diaphoretic.  Left axilla at 6 O' clock, it is 1-1.5 cm and fluctuant, non-draining   Psychiatric: She has a normal mood and affect. Her behavior is normal. Judgment and thought content normal.   Assessment & Plan:   Ryna was seen today for acute visit.  Diagnoses and all orders for this visit:  Uncontrolled type 2 diabetes mellitus without complication, with long-term current use of insulin ([place])  Thrush  Tobacco use  Boil, axilla  Acute URI  Other orders -     nicotine (NICODERM CQ - DOSED IN MG/24 HOURS) 21 mg/24hr patch; Place 1 patch (21 mg total) onto the skin daily. -     buPROPion (WELLBUTRIN XL) 150 MG 24 hr tablet; Take 1 tablet (150 mg total) by mouth daily. -     chlorpheniramine-HYDROcodone (TUSSIONEX PENNKINETIC ER) 10-8 MG/5ML SUER; Take 5 mLs by mouth at bedtime as needed for cough. -     doxycycline (VIBRA-TABS) 100 MG tablet; Take 1 tablet (100 mg total) by mouth 2 (two) times daily. -     fluconazole (DIFLUCAN) 150 MG tablet; Take 1 tablet (150 mg total) by mouth once.   I am having Ms. Goerke start on chlorpheniramine-HYDROcodone, doxycycline, and fluconazole. I am also having her maintain her cetirizine, blood glucose meter kit and supplies, chlorhexidine, triamcinolone ointment, insulin aspart, insulin glargine, albuterol, nicotine, and buPROPion.  Meds ordered this encounter  Medications  . nicotine (NICODERM CQ - DOSED IN MG/24 HOURS) 21 mg/24hr patch    Sig: Place 1 patch (21 mg total) onto the skin daily.    Dispense:  28 patch    Refill:  0    Order Specific Question:  Supervising Provider    Answer:  Deborra Medina L [[id number]]  . buPROPion (WELLBUTRIN XL) 150 MG 24 hr tablet    Sig: Take 1 tablet (150 mg total) by mouth daily.    Dispense:  30 tablet    Refill:  0    Order  Specific Question:  Supervising Provider    Answer:  Deborra Medina L [[id number]]  . chlorpheniramine-HYDROcodone (TUSSIONEX PENNKINETIC ER) 10-8 MG/5ML SUER    Sig: Take 5 mLs by mouth at bedtime as needed for cough.    Dispense:  115 mL    Refill:  0    Order Specific Question:  Supervising Provider    Answer:  Derrel Nip, TERESA L [[id number]]  . doxycycline (VIBRA-TABS) 100 MG tablet    Sig: Take 1 tablet (100 mg total) by mouth 2 (two) times daily.    Dispense:  14 tablet    Refill:  0    Order Specific Question:  Supervising Provider    Answer:  Deborra Medina L [[id number]]  . fluconazole (DIFLUCAN) 150 MG tablet    Sig: Take 1 tablet (150 mg total) by mouth once.  Dispense:  3 tablet    Refill:  0    Order Specific Question:  Supervising Provider    Answer:  Crecencio Mc [[id number]]     Follow-up: Return in about 4 weeks (around 03/07/2016) for follow up.

## 2016-02-10 DIAGNOSIS — L02429 Furuncle of limb, unspecified: Secondary | ICD-10-CM | POA: Insufficient documentation

## 2016-02-10 DIAGNOSIS — B370 Candidal stomatitis: Secondary | ICD-10-CM | POA: Insufficient documentation

## 2016-02-10 DIAGNOSIS — J069 Acute upper respiratory infection, unspecified: Secondary | ICD-10-CM | POA: Insufficient documentation

## 2016-02-10 NOTE — Assessment & Plan Note (Signed)
Probable thrush Pt was given abx for other concerns and will try 3 doses of diflucan at three specific times outlined on AVS and discussed with pt Discussed need for smoking cessation and better control of DM type II

## 2016-02-10 NOTE — Assessment & Plan Note (Signed)
Called back in the Nicoderm CQ

## 2016-02-10 NOTE — Assessment & Plan Note (Signed)
Advised writing numbers down for better control Watch carbs FU in 4 weeks  Advised her to USE the sliding scale that she knows how to use, but does not.

## 2016-02-10 NOTE — Assessment & Plan Note (Signed)
Left axilla Advised warm wet compresses multiple times daily Do not open, let drain naturally  Doxycyline called into pharmacy  FU prn worsening/failure to improve.

## 2016-02-10 NOTE — Assessment & Plan Note (Signed)
New onset Will treat conservatively due to probable viral nature Tussionex syrup printed, signed, and given to pt to take to pharmacy  Cautioned on drowsy effects and how much to take FU prn worsening/failure to improve.

## 2016-03-07 ENCOUNTER — Other Ambulatory Visit: Payer: Self-pay

## 2016-03-07 MED ORDER — FLUCONAZOLE 150 MG PO TABS: 150.0000 mg | ORAL_TABLET | Freq: Once | ORAL | Status: DC

## 2016-03-07 NOTE — Telephone Encounter (Signed)
Pt states that she has thrush coming back in her mouth, she states that her tongue is started burning this morning while trying to eat breakfast. Pt is requesting a refill on diflucan, Please advise, thanks

## 2016-03-12 ENCOUNTER — Ambulatory Visit: Payer: BLUE CROSS/BLUE SHIELD | Admitting: Nurse Practitioner

## 2016-04-03 ENCOUNTER — Ambulatory Visit: Payer: BLUE CROSS/BLUE SHIELD | Admitting: Nurse Practitioner

## 2016-04-29 ENCOUNTER — Emergency Department: Payer: BLUE CROSS/BLUE SHIELD

## 2016-04-29 ENCOUNTER — Inpatient Hospital Stay
Admission: EM | Admit: 2016-04-29 | Discharge: 2016-04-30 | DRG: 637 | Disposition: A | Discharge status: Home / Self Care | Payer: BLUE CROSS/BLUE SHIELD | Attending: Internal Medicine | Admitting: Internal Medicine

## 2016-04-29 ENCOUNTER — Encounter: Payer: Self-pay | Admitting: Internal Medicine

## 2016-04-29 DIAGNOSIS — Z833 Family history of diabetes mellitus: Secondary | ICD-10-CM

## 2016-04-29 DIAGNOSIS — J45909 Unspecified asthma, uncomplicated: Secondary | ICD-10-CM | POA: Diagnosis present

## 2016-04-29 DIAGNOSIS — E1310 Other specified diabetes mellitus with ketoacidosis without coma: Secondary | ICD-10-CM | POA: Diagnosis not present

## 2016-04-29 DIAGNOSIS — Z818 Family history of other mental and behavioral disorders: Secondary | ICD-10-CM

## 2016-04-29 DIAGNOSIS — R112 Nausea with vomiting, unspecified: Secondary | ICD-10-CM | POA: Diagnosis present

## 2016-04-29 DIAGNOSIS — E875 Hyperkalemia: Secondary | ICD-10-CM | POA: Diagnosis present

## 2016-04-29 DIAGNOSIS — Z794 Long term (current) use of insulin: Secondary | ICD-10-CM | POA: Diagnosis not present

## 2016-04-29 DIAGNOSIS — N289 Disorder of kidney and ureter, unspecified: Secondary | ICD-10-CM

## 2016-04-29 DIAGNOSIS — K859 Acute pancreatitis without necrosis or infection, unspecified: Secondary | ICD-10-CM | POA: Diagnosis present

## 2016-04-29 DIAGNOSIS — E871 Hypo-osmolality and hyponatremia: Secondary | ICD-10-CM | POA: Diagnosis present

## 2016-04-29 DIAGNOSIS — E1110 Type 2 diabetes mellitus with ketoacidosis without coma: Secondary | ICD-10-CM | POA: Diagnosis present

## 2016-04-29 DIAGNOSIS — F17210 Nicotine dependence, cigarettes, uncomplicated: Secondary | ICD-10-CM | POA: Diagnosis present

## 2016-04-29 DIAGNOSIS — Z79899 Other long term (current) drug therapy: Secondary | ICD-10-CM | POA: Diagnosis not present

## 2016-04-29 DIAGNOSIS — N179 Acute kidney failure, unspecified: Secondary | ICD-10-CM | POA: Diagnosis present

## 2016-04-29 DIAGNOSIS — A419 Sepsis, unspecified organism: Secondary | ICD-10-CM | POA: Diagnosis present

## 2016-04-29 DIAGNOSIS — R000 Tachycardia, unspecified: Secondary | ICD-10-CM | POA: Diagnosis present

## 2016-04-29 HISTORY — DX: Alcohol induced acute pancreatitis without necrosis or infection: K85.20

## 2016-04-29 LAB — BLOOD GAS, VENOUS
Acid-base deficit: 23.1 mmol/L — ABNORMAL HIGH (ref 0.0–2.0)
Bicarbonate: 7.8 mEq/L — ABNORMAL LOW (ref 21.0–28.0)
FIO2: 0.21
PCO2 VEN: 33 mmHg — AB (ref 44.0–60.0)
PH VEN: 6.98 — AB (ref 7.320–7.430)
Patient temperature: 37
pO2, Ven: <31 mmHg — ABNORMAL LOW (ref 31.0–45.0)

## 2016-04-29 LAB — BLOOD GAS, ARTERIAL
ACID-BASE DEFICIT: 17.4 mmol/L — AB (ref 0.0–2.0)
BICARBONATE: 8 meq/L — AB (ref 21.0–28.0)
FIO2: 0.21
O2 Saturation: 97.2 %
PATIENT TEMPERATURE: 37
pCO2 arterial: 19 mmHg — CL (ref 32.0–48.0)
pH, Arterial: 7.23 — ABNORMAL LOW (ref 7.350–7.450)
pO2, Arterial: 109 mmHg — ABNORMAL HIGH (ref 83.0–108.0)

## 2016-04-29 LAB — URINALYSIS COMPLETE WITH MICROSCOPIC (ARMC ONLY)
BACTERIA UA: NONE SEEN
Bilirubin Urine: NEGATIVE
LEUKOCYTES UA: NEGATIVE
Nitrite: NEGATIVE
PH: 5 (ref 5.0–8.0)
Protein, ur: 30 mg/dL — AB
RBC / HPF: NONE SEEN RBC/hpf (ref 0–5)
SQUAMOUS EPITHELIAL / LPF: NONE SEEN
Specific Gravity, Urine: 1.021 (ref 1.005–1.030)

## 2016-04-29 LAB — CBC
HEMATOCRIT: 45 % (ref 35.0–47.0)
HEMOGLOBIN: 13.9 g/dL (ref 12.0–16.0)
MCH: 31.2 pg (ref 26.0–34.0)
MCHC: 30.9 g/dL — AB (ref 32.0–36.0)
MCV: 101 fL — AB (ref 80.0–100.0)
Platelets: 394 10*3/uL (ref 150–440)
RBC: 4.45 MIL/uL (ref 3.80–5.20)
RDW: 14.9 % — ABNORMAL HIGH (ref 11.5–14.5)
WBC: 23 10*3/uL — ABNORMAL HIGH (ref 3.6–11.0)

## 2016-04-29 LAB — COMPREHENSIVE METABOLIC PANEL
ALT: 17 U/L (ref 14–54)
ANION GAP: 26 — AB (ref 5–15)
AST: 34 U/L (ref 15–41)
Albumin: 5.5 g/dL — ABNORMAL HIGH (ref 3.5–5.0)
Alkaline Phosphatase: 77 U/L (ref 38–126)
BUN: 16 mg/dL (ref 6–20)
CHLORIDE: 96 mmol/L — AB (ref 101–111)
CO2: 8 mmol/L — AB (ref 22–32)
Calcium: 9 mg/dL (ref 8.9–10.3)
Creatinine, Ser: 1.21 mg/dL — ABNORMAL HIGH (ref 0.44–1.00)
GFR calc non Af Amer: 54 mL/min — ABNORMAL LOW (ref >60)
Glucose, Bld: 580 mg/dL (ref 65–99)
POTASSIUM: 6.5 mmol/L — AB (ref 3.5–5.1)
SODIUM: 130 mmol/L — AB (ref 135–145)
Total Bilirubin: 1.6 mg/dL — ABNORMAL HIGH (ref 0.3–1.2)
Total Protein: 9.3 g/dL — ABNORMAL HIGH (ref 6.5–8.1)

## 2016-04-29 LAB — GLUCOSE, CAPILLARY
GLUCOSE-CAPILLARY: 417 mg/dL — AB (ref 65–99)
GLUCOSE-CAPILLARY: 273 mg/dL — AB (ref 65–99)
Glucose-Capillary: 594 mg/dL (ref 65–99)
Glucose-Capillary: 338 mg/dL — ABNORMAL HIGH (ref 65–99)

## 2016-04-29 LAB — RAPID INFLUENZA A&B ANTIGENS (ARMC ONLY): INFLUENZA B (ARMC): NEGATIVE

## 2016-04-29 LAB — RAPID INFLUENZA A&B ANTIGENS: Influenza A (ARMC): NEGATIVE

## 2016-04-29 LAB — LIPASE, BLOOD: LIPASE: 14 U/L (ref 11–51)

## 2016-04-29 MED ORDER — SODIUM POLYSTYRENE SULFONATE 15 GM/60ML PO SUSP: 30.0000 g | Freq: Once | ORAL | Status: DC

## 2016-04-29 MED ORDER — SODIUM CHLORIDE 0.9 % IV BOLUS (SEPSIS)
1000.0000 mL | Freq: Once | INTRAVENOUS | Status: AC
  Administered 2016-04-29: 1000 mL via INTRAVENOUS

## 2016-04-29 MED ORDER — ONDANSETRON HCL 4 MG/2ML IJ SOLN
4.0000 mg | Freq: Once | INTRAMUSCULAR | Status: AC
  Administered 2016-04-29: 4 mg via INTRAVENOUS

## 2016-04-29 MED ORDER — VANCOMYCIN HCL IN DEXTROSE 750-5 MG/150ML-% IV SOLN
750.0000 mg | INTRAVENOUS | Status: DC
  Administered 2016-04-30: 750 mg via INTRAVENOUS
  Filled 2016-04-29: qty 150

## 2016-04-29 MED ORDER — ENOXAPARIN SODIUM 40 MG/0.4ML ~~LOC~~ SOLN: 40.0000 mg | Freq: Every day | SUBCUTANEOUS | Status: DC

## 2016-04-29 MED ORDER — PIPERACILLIN-TAZOBACTAM 3.375 G IVPB
3.3750 g | Freq: Three times a day (TID) | INTRAVENOUS | Status: DC
  Filled 2016-04-29 (×3): qty 50

## 2016-04-29 MED ORDER — SODIUM CHLORIDE 0.9 % IV SOLN
1.0000 g | Freq: Once | INTRAVENOUS | Status: AC
  Administered 2016-04-29: 1 g via INTRAVENOUS
  Filled 2016-04-29: qty 10

## 2016-04-29 MED ORDER — ACETAMINOPHEN 325 MG PO TABS: 650.0000 mg | ORAL_TABLET | Freq: Four times a day (QID) | ORAL | Status: DC | PRN

## 2016-04-29 MED ORDER — PIPERACILLIN-TAZOBACTAM 3.375 G IVPB 30 MIN
3.3750 g | Freq: Once | INTRAVENOUS | Status: AC
  Administered 2016-04-29: 3.375 g via INTRAVENOUS
  Filled 2016-04-29: qty 50

## 2016-04-29 MED ORDER — ONDANSETRON HCL 4 MG PO TABS: 4.0000 mg | ORAL_TABLET | Freq: Four times a day (QID) | ORAL | Status: DC | PRN

## 2016-04-29 MED ORDER — INSULIN ASPART 100 UNIT/ML ~~LOC~~ SOLN
10.0000 [IU] | Freq: Once | SUBCUTANEOUS | Status: AC
  Administered 2016-04-29: 10 [IU] via INTRAVENOUS
  Filled 2016-04-29: qty 10

## 2016-04-29 MED ORDER — DEXTROSE-NACL 5-0.45 % IV SOLN
INTRAVENOUS | Status: DC
  Administered 2016-04-29: 01:00:00 via INTRAVENOUS

## 2016-04-29 MED ORDER — ACETAMINOPHEN 650 MG RE SUPP: 650.0000 mg | Freq: Four times a day (QID) | RECTAL | Status: DC | PRN

## 2016-04-29 MED ORDER — SODIUM CHLORIDE 0.9% FLUSH
3.0000 mL | Freq: Two times a day (BID) | INTRAVENOUS | Status: DC
Start: 2016-04-29 — End: 2016-04-30
  Administered 2016-04-29 – 2016-04-30 (×2): 3 mL via INTRAVENOUS

## 2016-04-29 MED ORDER — ONDANSETRON HCL 4 MG/2ML IJ SOLN: 4.0000 mg | Freq: Four times a day (QID) | INTRAMUSCULAR | Status: DC | PRN

## 2016-04-29 MED ORDER — SODIUM CHLORIDE 0.9 % IV SOLN: INTRAVENOUS | Status: DC

## 2016-04-29 MED ORDER — SODIUM CHLORIDE 0.9 % IV SOLN
INTRAVENOUS | Status: DC
  Administered 2016-04-29: 4.1 [IU]/h via INTRAVENOUS
  Filled 2016-04-29: qty 2.5

## 2016-04-29 MED ORDER — ALBUTEROL SULFATE (2.5 MG/3ML) 0.083% IN NEBU: 3.0000 mL | INHALATION_SOLUTION | Freq: Four times a day (QID) | RESPIRATORY_TRACT | Status: DC | PRN

## 2016-04-29 MED ORDER — VANCOMYCIN HCL IN DEXTROSE 750-5 MG/150ML-% IV SOLN
750.0000 mg | Freq: Once | INTRAVENOUS | Status: AC
  Administered 2016-04-29: 750 mg via INTRAVENOUS
  Filled 2016-04-29: qty 150

## 2016-04-29 MED ORDER — ONDANSETRON HCL 4 MG/2ML IJ SOLN
4.0000 mg | Freq: Once | INTRAMUSCULAR | Status: DC | PRN
  Filled 2016-04-29 (×2): qty 2

## 2016-04-29 MED ORDER — VANCOMYCIN HCL IN DEXTROSE 1-5 GM/200ML-% IV SOLN: 1000.0000 mg | Freq: Once | INTRAVENOUS | Status: DC

## 2016-04-29 NOTE — ED Provider Notes (Signed)
[facility] Emergency Department Provider Note  ____________________________________________  Time seen: Approximately 7:56 PM  I have reviewed the triage vital signs and the nursing notes.   HISTORY  Chief Complaint Emesis; Hyperglycemia; and Abdominal Pain    HPI Mary Haney is a 45 y.o. female with a history of insulin-dependent diabetespresenting with hyperglycemia, nausea and vomiting, and cough. The patient reports that for the last 24 hours, she's been unable to control her blood sugars at home with her regular insulin dosage. She has also had an associated nonproductive intermittent cough without shortness of breath, congestion or rhinorrhea, ear pain or sore throat. She is not had any fever or chills. Yesterday, she began to have nausea and vomiting and then developed a mild epigastric pain that only occurs with active vomiting. She has had no diarrhea or constipation. No dysuria. No chest pain. LMP 7 days ago.   Past Medical History  Diagnosis Date  . Asthma     Has albuterol inhaler  . Diabetes mellitus without complication ([place])     Type 2 diagnosed 2013  . Alcoholic pancreatitis     Patient Active Problem List   Diagnosis Date Noted  . Sepsis ([place]) 04/29/2016  . Asthma 04/29/2016  . Hyperkalemia 04/29/2016  . Hyponatremia 04/29/2016  . AKI (acute kidney injury) ([place]) 04/29/2016  . Nausea and vomiting 04/29/2016  . DKA (diabetic ketoacidoses) ([place]) 04/29/2016  . Thrush 02/10/2016  . Boil, axilla 02/10/2016  . Acute URI 02/10/2016  . Vaginitis and vulvovaginitis 09/21/2015  . Encounter for routine gynecological examination 05/03/2015  . Tobacco use 04/09/2015  . Eczema of scalp 04/03/2015  . Diabetes type 2, uncontrolled ([place]) 07/20/2014  . Vaginal discharge 02/23/2014  . Routine general medical examination at a health care facility 07/14/2013  . Screening for breast cancer 07/14/2013    Past Surgical History  Procedure  Laterality Date  . No past surgeries      No current outpatient prescriptions on file.  Allergies Review of patient's allergies indicates no known allergies.  Family History  Problem Relation Age of Onset  . Diabetes Father   . Depression Paternal Grandmother   . Depression Paternal Grandfather   . Depression Cousin     Social History Social History  Substance Use Topics  . Smoking status: Current Every Day Smoker -- 0.50 packs/day for 25 years    Types: Cigarettes  . Smokeless tobacco: Never Used     Comment: Has Chantix  . Alcohol Use: 1.8 oz/week    3 Cans of beer per week    Review of Systems Constitutional: No fever/chills.  Positive general malaise. Positive jaundice weakness. Negative lightheadedness or syncope. Positive dry mouth. Eyes: No visual changes. No eye discharge. ENT: No sore throat. No congestion or rhinorrhea. Cardiovascular: Denies chest pain. Denies palpitations. Respiratory: Denies shortness of breath.  Positive cough. Gastrointestinal: Positive epigastric abdominal pain.  Positive nausea, positive vomiting.  No diarrhea.  No constipation. Genitourinary: Negative for dysuria. Musculoskeletal: Negative for back pain. Skin: Negative for rash. Neurological: Negative for headaches. No focal numbness, tingling or weakness.   10-point ROS otherwise negative.  ____________________________________________   PHYSICAL EXAM:  VITAL SIGNS: ED Triage Vitals  Enc Vitals Group     BP 04/29/16 1844 155/95 mmHg     Pulse Rate 04/29/16 1844 115     Resp 04/29/16 1844 24     Temp 04/29/16 1844 97.5 F (36.4 C)     Temp Source 04/29/16 1844 Oral  SpO2 04/29/16 1844 100 %     Weight 04/29/16 1844 100 lb (45.36 kg)     Height 04/29/16 1844 5\' 3"  (1.6 m)     Head Cir --      Peak Flow --      Pain Score 04/29/16 1845 4     Pain Loc --      Pain Edu? --      Excl. in [place]? --     Constitutional: Alert and oriented. Thin, in no acute distress. Answers  questions appropriately. Eyes: Conjunctivae are normal.  EOMI. No scleral icterus. No eye discharge. Head: Atraumatic. Nose: No congestion/rhinnorhea. Mouth/Throat: Mucous membranes are dry.  Neck: No stridor.  Supple.  No JVD. No meningismus. Cardiovascular: Normal rate, regular rhythm. Holosystolic murmur without rubs or gallops.  Respiratory: Normal respiratory effort.  No accessory muscle use or retractions. Lungs CTAB.  No wheezes, rales or ronchi. Gastrointestinal: Soft, nontender and nondistended.  No reproducible tenderness to palpation. No guarding or rebound.  No peritoneal signs. Musculoskeletal: No LE edema. No ttp in the calves or palpable cords.  Negative Homan's sign. Neurologic:  A&Ox3.  Speech is clear.  Face and smile are symmetric.  EOMI.  Moves all extremities well. Skin:  Skin is warm, dry and intact. No rash noted. Psychiatric: Mood and affect are normal. Speech and behavior are normal.  Normal judgement.  ____________________________________________   LABS (all labs ordered are listed, but only abnormal results are displayed)  Labs Reviewed  CBC - Abnormal; Notable for the following:    WBC 23.0 (*)    MCV 101.0 (*)    MCHC 30.9 (*)    RDW 14.9 (*)    All other components within normal limits  URINALYSIS COMPLETEWITH MICROSCOPIC ([facility] ONLY) - Abnormal; Notable for the following:    Color, Urine STRAW (*)    APPearance CLEAR (*)    Glucose, UA >500 (*)    Ketones, ur 2+ (*)    Hgb urine dipstick 1+ (*)    Protein, ur 30 (*)    All other components within normal limits  COMPREHENSIVE METABOLIC PANEL - Abnormal; Notable for the following:    Sodium 130 (*)    Potassium 6.5 (*)    Chloride 96 (*)    CO2 8 (*)    Glucose, Bld 580 (*)    Creatinine, Ser 1.21 (*)    Total Protein 9.3 (*)    Albumin 5.5 (*)    Total Bilirubin 1.6 (*)    GFR calc non Af Amer 54 (*)    Anion gap 26 (*)    All other components within normal limits  GLUCOSE, CAPILLARY -  Abnormal; Notable for the following:    Glucose-Capillary 594 (*)    All other components within normal limits  BLOOD GAS, VENOUS - Abnormal; Notable for the following:    pH, Ven 6.98 (*)    pCO2, Ven 33 (*)    pO2, Ven <31.0 (*)    Bicarbonate 7.8 (*)    Acid-base deficit 23.1 (*)    All other components within normal limits  BLOOD GAS, ARTERIAL - Abnormal; Notable for the following:    pH, Arterial 7.23 (*)    pCO2 arterial 19 (*)    pO2, Arterial 109 (*)    Bicarbonate 8.0 (*)    Acid-base deficit 17.4 (*)    All other components within normal limits  GLUCOSE, CAPILLARY - Abnormal; Notable for the following:    Glucose-Capillary 417 (*)  All other components within normal limits  GLUCOSE, CAPILLARY - Abnormal; Notable for the following:    Glucose-Capillary 338 (*)    All other components within normal limits  GLUCOSE, CAPILLARY - Abnormal; Notable for the following:    Glucose-Capillary 273 (*)    All other components within normal limits  RAPID INFLUENZA A&B ANTIGENS ([facility] ONLY)  CULTURE, BLOOD (ROUTINE X 2)  CULTURE, BLOOD (ROUTINE X 2)  URINE CULTURE  MRSA PCR SCREENING  LIPASE, BLOOD  LACTIC ACID, PLASMA  LACTIC ACID, PLASMA  BASIC METABOLIC PANEL  BASIC METABOLIC PANEL  BASIC METABOLIC PANEL  BASIC METABOLIC PANEL  INFLUENZA PANEL BY PCR (TYPE A & B, 99991111)  BASIC METABOLIC PANEL  POTASSIUM  CBG MONITORING, ED  POC URINE PREG, ED   ____________________________________________  EKG  ED ECG REPORT I, Eula Listen, the attending physician, personally viewed and interpreted this ECG.   Date: 04/29/2016  EKG Time: 1854  Rate: 111  Rhythm: sinus tachycardia  Axis: Normal  Intervals:none  ST&T Change: No ST elevation. Nonspecific T-wave inversion in V1.  ____________________________________________  RADIOLOGY  Dg Chest 2 View  04/29/2016  CLINICAL DATA:  Acute onset of nausea and vomiting. Hyperglycemia. Shortness of breath and tachypnea.  Initial encounter. EXAM: CHEST  2 VIEW COMPARISON:  Chest radiograph performed 09/01/2008 FINDINGS: The lungs are well-aerated and clear. There is no evidence of focal opacification, pleural effusion or pneumothorax. The heart is normal in size; the mediastinal contour is within normal limits. No acute osseous abnormalities are seen. IMPRESSION: No acute cardiopulmonary process seen. Electronically Signed   By: Garald Balding M.D.   On: 04/29/2016 20:14    ____________________________________________   PROCEDURES  Procedure(s) performed: None  Critical Care performed: Yes, see critical care note(s) ____________________________________________   INITIAL IMPRESSION / ASSESSMENT AND PLAN / ED COURSE  Pertinent labs & imaging results that were available during my care of the patient were reviewed by me and considered in my medical decision making (see chart for details).  45 y.o. female with insulin-dependent diabetes presenting with 24 hours of hyperglycemia associated with nausea and vomiting, and mild cough. I will evaluate the patient for infectious processes including UTI, pneumonia, and also consider viral URI. We will get a influenza screen as well. On my exam, the patient does not have any focal abdominal findings that would be concerning for acute surgical pathology. She does appear to be dehydrated and I will treat her with IV fluids and insulin once we have her results.  ----------------------------------------- 8:01 PM on 04/29/2016 -----------------------------------------  The patient has a fingerstick that is 594. She also has a white blood cell count of 23, with a pH of 6.98. I do not have her CMP back yet but I'm concerned for advanced DKA. I will initiate her on a glucose stabilized her drip, and she will need to be admitted to the hospital. At this time, I do not have the specific source for her hyperglycemia and DKA, but given the multiple possibilities, I'll treat her  empirically with broad-spectrum antibiotics that can be narrowed once a source is isolated.  CRITICAL CARE Performed by: Eula Listen   Total critical care time: 45 minutes  Critical care time was exclusive of separately billable procedures and treating other patients.  Critical care was necessary to treat or prevent imminent or life-threatening deterioration.  Critical care was time spent personally by me on the following activities: development of treatment plan with patient and/or surrogate as well as nursing, discussions  with consultants, evaluation of patient's response to treatment, examination of patient, obtaining history from patient or surrogate, ordering and performing treatments and interventions, ordering and review of laboratory studies, ordering and review of radiographic studies, pulse oximetry and re-evaluation of patient's condition.   ____________________________________________  FINAL CLINICAL IMPRESSION(S) / ED DIAGNOSES  Final diagnoses:  Diabetic ketoacidosis without coma associated with type 2 diabetes mellitus ([place])  Renal insufficiency  Hyperkalemia      NEW MEDICATIONS STARTED DURING THIS VISIT:  Current Discharge Medication List       Eula Listen, MD 04/30/16 0011

## 2016-04-29 NOTE — Progress Notes (Signed)
Pharmacy Antibiotic Note  Mary Haney is a 45 y.o. female admitted on 04/29/2016 with sepsis.  Pharmacy has been consulted for vancomycin & piperacillin/tazobactam dosing.  Plan: Piperacillin/tazobactam 3.375 g IV q8h EI  Patient received vancomycin 750 mg x 1 dose in ED Will order vancomycin 750 mg IV q24h beginning at 0600 tomorrow morning (9 hour stacked dose) Vancomycin trough ordered for 5/25 @ 0530 Goal vancomycin trough 15-20 mcg/mL  Kinetics: Use actual body weight of 45 kg Ke: 0.040 Half-life: ~17.5 hrs Vd: 31 L Cmin ~17 mcg/mL (calculated)  Height: 5\' 3"  (160 cm) Weight: 100 lb (45.36 kg) IBW/kg (Calculated) : 52.4  Temp (24hrs), Avg:97.5 F (36.4 C), Min:97.5 F (36.4 C), Max:97.5 F (36.4 C)   Recent Labs Lab 04/29/16 1853  WBC 23.0*  CREATININE 1.21*    Estimated Creatinine Clearance: 43 mL/min (by C-G formula based on Cr of 1.21).    No Known Allergies  Antimicrobials this admission: vancomycin 5/23 >>  Piperacillin/tazobactam 5/23 >>   Dose adjustments this admission:  Microbiology results: 5/23 BCx: Sent 5/23 UCx: Sent   Thank you for allowing pharmacy to be a part of this patient's care.  Lenis Noon, PharmD Clinical Pharmacist 04/29/2016 9:39 PM

## 2016-04-29 NOTE — ED Notes (Signed)
Patient presents to the ED with nausea, vomiting (x6 in 24h), and hyperglycemia.  Patient states she checked her sugar 4 times today and each time was over 400.  Patient appears somewhat short of breath and tachypnic.  Patient reports abdomin and ribs are sore from vomiting.

## 2016-04-29 NOTE — ED Notes (Addendum)
Pt states that she is dehydrated. C/o vomiting, rib pain and abdominal pain "from throwing up" per patient report. Pt diabetic; states CBG has been high. States all sx started today. Pt alert and oriented X4, active, cooperative, pt in NAD. RR even, color WNL.

## 2016-04-29 NOTE — ED Notes (Signed)
Hospitalist at bedside 

## 2016-04-29 NOTE — ED Notes (Signed)
CBG= 471

## 2016-04-29 NOTE — H&P (Signed)
[facility] [place] NAME: Mary Haney    MR#:  [id number]  DATE OF BIRTH:  1971-03-16  DATE OF ADMISSION:  04/29/2016  PRIMARY CARE PHYSICIAN: Rubbie Battiest, NP   REQUESTING/REFERRING PHYSICIAN: Mariea Clonts, MD  CHIEF COMPLAINT:   Chief Complaint  Patient presents with  . Emesis  . Hyperglycemia  . Abdominal Pain    HISTORY OF PRESENT ILLNESS:  Mary Haney  is a 45 y.o. female who presents with Nausea and vomiting, hyperglycemia. Patient states that for the past 2 days she's had significant nausea and vomiting with abdominal pain. She states that her blood sugars have been running high at home, that she's been trying to correct him with insulin, but did not want to take too much as she was ordered to Her blood sugar too far. She came to the ED for evaluation and was found to be in DKA with leukocytosis. She was treated initially for DKA and sepsis, and hospitalists were called for admission.  PAST MEDICAL HISTORY:   Past Medical History  Diagnosis Date  . Asthma     Has albuterol inhaler  . Diabetes mellitus without complication ([place])     Type 2 diagnosed 2013  . Alcoholic pancreatitis     PAST SURGICAL HISTORY:   Past Surgical History  Procedure Laterality Date  . No past surgeries      SOCIAL HISTORY:   Social History  Substance Use Topics  . Smoking status: Current Every Day Smoker -- 0.50 packs/day for 25 years    Types: Cigarettes  . Smokeless tobacco: Never Used     Comment: Has Chantix  . Alcohol Use: 1.8 oz/week    3 Cans of beer per week    FAMILY HISTORY:   Family History  Problem Relation Age of Onset  . Diabetes Father   . Depression Paternal Grandmother   . Depression Paternal Grandfather   . Depression Cousin     DRUG ALLERGIES:  No Known Allergies  MEDICATIONS AT HOME:   Prior to Admission medications   Medication Sig Start Date End Date Taking? Authorizing Provider  albuterol  (PROAIR HFA) 108 (90 Base) MCG/ACT inhaler Inhale 2 puffs into the lungs every 6 (six) hours as needed for wheezing or shortness of breath. 01/31/16  Yes Rubbie Battiest, NP  cetirizine (ZYRTEC) 10 MG tablet Take 10 mg by mouth daily as needed for allergies.    Yes Historical Provider, MD  insulin aspart (NOVOLOG FLEXPEN) 100 UNIT/ML FlexPen Inject 3 Units into the skin See admin instructions. Pt uses three units three times daily with meals and one unit plus sliding scale with snacks.   Yes Historical Provider, MD  insulin glargine (LANTUS) 100 UNIT/ML injection Inject 18 Units into the skin at bedtime.   Yes Historical Provider, MD  triamcinolone ointment (KENALOG) 0.1 % Apply 1 application topically daily as needed (for eczema).   Yes Historical Provider, MD  buPROPion (WELLBUTRIN XL) 150 MG 24 hr tablet Take 1 tablet (150 mg total) by mouth daily. Patient not taking: Reported on 04/29/2016 02/08/16   Rubbie Battiest, NP  chlorpheniramine-HYDROcodone [facility] PENNKINETIC ER) 10-8 MG/5ML SUER Take 5 mLs by mouth at bedtime as needed for cough. Patient not taking: Reported on 04/29/2016 02/08/16   Rubbie Battiest, NP  doxycycline (VIBRA-TABS) 100 MG tablet Take 1 tablet (100 mg total) by mouth 2 (two) times daily. Patient not taking: Reported on 04/29/2016 02/08/16   Velora Heckler  Doss, NP  fluconazole (DIFLUCAN) 150 MG tablet Take 1 tablet (150 mg total) by mouth once. Patient not taking: Reported on 04/29/2016 03/07/16   Rubbie Battiest, NP  nicotine (NICODERM CQ - DOSED IN MG/24 HOURS) 21 mg/24hr patch Place 1 patch (21 mg total) onto the skin daily. Patient not taking: Reported on 04/29/2016 02/08/16   Rubbie Battiest, NP    REVIEW OF SYSTEMS:  Review of Systems  Constitutional: Negative for fever, chills, weight loss and malaise/fatigue.  HENT: Negative for ear pain, hearing loss and tinnitus.   Eyes: Negative for blurred vision, double vision, pain and redness.  Respiratory: Negative for cough, hemoptysis and  shortness of breath.   Cardiovascular: Negative for chest pain, palpitations, orthopnea and leg swelling.  Gastrointestinal: Positive for nausea, vomiting and abdominal pain. Negative for diarrhea and constipation.  Genitourinary: Negative for dysuria, frequency and hematuria.  Musculoskeletal: Negative for back pain, joint pain and neck pain.  Skin:       No acne, rash, or lesions  Neurological: Negative for dizziness, tremors, focal weakness and weakness.  Endo/Heme/Allergies: Negative for polydipsia. Does not bruise/bleed easily.  Psychiatric/Behavioral: Negative for depression. The patient is not nervous/anxious and does not have insomnia.      VITAL SIGNS:   Filed Vitals:   04/29/16 1844 04/29/16 2100  BP: 155/95 111/71  Pulse: 115 99  Temp: 97.5 F (36.4 C)   TempSrc: Oral   Resp: 24 27  Height: 5\' 3"  (1.6 m)   Weight: 45.36 kg (100 lb)   SpO2: 100% 100%   Wt Readings from Last 3 Encounters:  04/29/16 45.36 kg (100 lb)  02/08/16 56.019 kg (123 lb 8 oz)  09/18/15 59.421 kg (131 lb)    PHYSICAL EXAMINATION:  Physical Exam  Vitals reviewed. Constitutional: She is oriented to person, place, and time. She appears well-developed and well-nourished. No distress.  HENT:  Head: Normocephalic and atraumatic.  Dry mucous membranes  Eyes: Conjunctivae and EOM are normal. Pupils are equal, round, and reactive to light. No scleral icterus.  Neck: Normal range of motion. Neck supple. No JVD present. No thyromegaly present.  Cardiovascular: Regular rhythm and intact distal pulses.  Exam reveals no gallop and no friction rub.   No murmur heard. tachycardic  Respiratory: Effort normal and breath sounds normal. No respiratory distress. She has no wheezes. She has no rales.  GI: Soft. Bowel sounds are normal. She exhibits no distension. There is tenderness (diffuse).  Musculoskeletal: Normal range of motion. She exhibits no edema.  No arthritis, no gout  Lymphadenopathy:    She has  no cervical adenopathy.  Neurological: She is alert and oriented to person, place, and time. No cranial nerve deficit.  No dysarthria, no aphasia  Skin: Skin is warm and dry. No rash noted. No erythema.  Psychiatric: She has a normal mood and affect. Her behavior is normal. Judgment and thought content normal.    LABORATORY PANEL:   CBC  Recent Labs Lab 04/29/16 1853  WBC 23.0*  HGB 13.9  HCT 45.0  PLT 394   ------------------------------------------------------------------------------------------------------------------  Chemistries   Recent Labs Lab 04/29/16 1853  NA 130*  K 6.5*  CL 96*  CO2 8*  GLUCOSE 580*  BUN 16  CREATININE 1.21*  CALCIUM 9.0  AST 34  ALT 17  ALKPHOS 77  BILITOT 1.6*   ------------------------------------------------------------------------------------------------------------------  Cardiac Enzymes No results for input(s): TROPONINI in the last 168 hours. ------------------------------------------------------------------------------------------------------------------  RADIOLOGY:  Dg Chest 2 View  04/29/2016  CLINICAL DATA:  Acute onset of nausea and vomiting. Hyperglycemia. Shortness of breath and tachypnea. Initial encounter. EXAM: CHEST  2 VIEW COMPARISON:  Chest radiograph performed 09/01/2008 FINDINGS: The lungs are well-aerated and clear. There is no evidence of focal opacification, pleural effusion or pneumothorax. The heart is normal in size; the mediastinal contour is within normal limits. No acute osseous abnormalities are seen. IMPRESSION: No acute cardiopulmonary process seen. Electronically Signed   By: Garald Balding M.D.   On: 04/29/2016 20:14    EKG:   Orders placed or performed during the hospital encounter of 04/29/16  . EKG 12-Lead  . EKG 12-Lead    IMPRESSION AND PLAN:  Principal Problem:   Sepsis ([place]) - unclear source at this time. Urinary labs still pending. Chest x-ray clear. Certainly possibility of some GI  infection is there. She was started on broad-spectrum antibiotics, cultures were sent from the ED. Lactic acid pending. Hemodynamically stable. Continue antibiotics, follow-up cultures and other pending labs. Active Problems:   DKA - due to her about infection, insulin drip started in the ED, we'll continue this on admission, with DKA order set.   Hyperkalemia - patient is on insulin drip for treatment of DKA   Hyponatremia - pseudohyponatremia due to her DKA, monitor for improvement with treatment of DKA.   AKI (acute kidney injury) ([place]) - likely prerenal from all the above, avoid nephrotoxins and monitor for improvement with fluids and other treatments as above   Nausea and vomiting - when necessary antiemetics, likely due to her sepsis and infection.   Asthma - home inhaler when necessary  All the records are reviewed and case discussed with ED provider. Management plans discussed with the patient and/or family.  DVT PROPHYLAXIS: SubQ lovenox  GI PROPHYLAXIS: None  ADMISSION STATUS: Inpatient  CODE STATUS: Full Code Status History    This patient does not have a recorded code status. Please follow your organizational policy for patients in this situation.      TOTAL CRITICAL CARE TIME TAKING CARE OF THIS PATIENT: 50 minutes.    Kahlil Cowans FIELDING 04/29/2016, 10:04 PM  [facility]  Office  [phone]  CC: Primary care physician; Rubbie Battiest, NP

## 2016-04-30 ENCOUNTER — Encounter: Payer: Self-pay | Admitting: *Deleted

## 2016-04-30 LAB — GLUCOSE, CAPILLARY
GLUCOSE-CAPILLARY: 157 mg/dL — AB (ref 65–99)
GLUCOSE-CAPILLARY: 181 mg/dL — AB (ref 65–99)
GLUCOSE-CAPILLARY: 188 mg/dL — AB (ref 65–99)
GLUCOSE-CAPILLARY: 198 mg/dL — AB (ref 65–99)
GLUCOSE-CAPILLARY: 217 mg/dL — AB (ref 65–99)
GLUCOSE-CAPILLARY: 280 mg/dL — AB (ref 65–99)
Glucose-Capillary: 129 mg/dL — ABNORMAL HIGH (ref 65–99)
Glucose-Capillary: 164 mg/dL — ABNORMAL HIGH (ref 65–99)
Glucose-Capillary: 145 mg/dL — ABNORMAL HIGH (ref 65–99)
Glucose-Capillary: 96 mg/dL (ref 65–99)
Glucose-Capillary: 98 mg/dL (ref 65–99)
Glucose-Capillary: 129 mg/dL — ABNORMAL HIGH (ref 65–99)

## 2016-04-30 LAB — CBC WITH DIFFERENTIAL/PLATELET
BASOS ABS: 0 10*3/uL (ref 0–0.1)
Eosinophils Absolute: 0 10*3/uL (ref 0–0.7)
HCT: 33.9 % — ABNORMAL LOW (ref 35.0–47.0)
Hemoglobin: 11.7 g/dL — ABNORMAL LOW (ref 12.0–16.0)
Lymphs Abs: 1.8 10*3/uL (ref 1.0–3.6)
MCH: 31.6 pg (ref 26.0–34.0)
MCHC: 34.5 g/dL (ref 32.0–36.0)
MCV: 91.7 fL (ref 80.0–100.0)
MONO ABS: 1.2 10*3/uL — AB (ref 0.2–0.9)
Monocytes Relative: 11 %
NEUTROS ABS: 7.7 10*3/uL — AB (ref 1.4–6.5)
Neutrophils Relative %: 72 %
PLATELETS: 283 10*3/uL (ref 150–440)
RBC: 3.7 MIL/uL — AB (ref 3.80–5.20)
RDW: 13.9 % (ref 11.5–14.5)
WBC: 10.7 10*3/uL (ref 3.6–11.0)

## 2016-04-30 LAB — BASIC METABOLIC PANEL
ANION GAP: 10 (ref 5–15)
ANION GAP: 8 (ref 5–15)
Anion gap: 9 (ref 5–15)
BUN: 10 mg/dL (ref 6–20)
BUN: 9 mg/dL (ref 6–20)
BUN: 9 mg/dL (ref 6–20)
CALCIUM: 8.1 mg/dL — AB (ref 8.9–10.3)
CALCIUM: 8.2 mg/dL — AB (ref 8.9–10.3)
CALCIUM: 8.4 mg/dL — AB (ref 8.9–10.3)
CHLORIDE: 108 mmol/L (ref 101–111)
CO2: 15 mmol/L — AB (ref 22–32)
CO2: 17 mmol/L — AB (ref 22–32)
CO2: 17 mmol/L — ABNORMAL LOW (ref 22–32)
Chloride: 110 mmol/L (ref 101–111)
Chloride: 109 mmol/L (ref 101–111)
Creatinine, Ser: 0.8 mg/dL (ref 0.44–1.00)
Creatinine, Ser: 0.78 mg/dL (ref 0.44–1.00)
Creatinine, Ser: 0.63 mg/dL (ref 0.44–1.00)
GFR calc Af Amer: >60 mL/min (ref >60)
GFR calc non Af Amer: >60 mL/min (ref >60)
GFR calc non Af Amer: >60 mL/min (ref >60)
GLUCOSE: 120 mg/dL — AB (ref 65–99)
Glucose, Bld: 172 mg/dL — ABNORMAL HIGH (ref 65–99)
Glucose, Bld: 192 mg/dL — ABNORMAL HIGH (ref 65–99)
POTASSIUM: 3.5 mmol/L (ref 3.5–5.1)
Potassium: 4 mmol/L (ref 3.5–5.1)
Potassium: 3.5 mmol/L (ref 3.5–5.1)
SODIUM: 134 mmol/L — AB (ref 135–145)
Sodium: 135 mmol/L (ref 135–145)
Sodium: 134 mmol/L — ABNORMAL LOW (ref 135–145)

## 2016-04-30 LAB — LACTIC ACID, PLASMA
Lactic Acid, Venous: 1.8 mmol/L (ref 0.5–2.0)
Lactic Acid, Venous: 1.3 mmol/L (ref 0.5–2.0)

## 2016-04-30 LAB — URINE CULTURE

## 2016-04-30 LAB — MRSA PCR SCREENING: MRSA BY PCR: NEGATIVE

## 2016-04-30 LAB — INFLUENZA PANEL BY PCR (TYPE A & B)
H1N1 flu by pcr: NOT DETECTED
INFLAPCR: NEGATIVE
INFLBPCR: NEGATIVE

## 2016-04-30 MED ORDER — POTASSIUM CHLORIDE IN NACL 20-0.9 MEQ/L-% IV SOLN
INTRAVENOUS | Status: DC
  Administered 2016-04-30: 11:00:00 via INTRAVENOUS
  Filled 2016-04-30 (×4): qty 1000

## 2016-04-30 MED ORDER — INSULIN ASPART 100 UNIT/ML ~~LOC~~ SOLN
0.0000 [IU] | Freq: Three times a day (TID) | SUBCUTANEOUS | Status: DC
  Administered 2016-04-30: 5 [IU] via SUBCUTANEOUS
  Filled 2016-04-30: qty 2
  Filled 2016-04-30: qty 5

## 2016-04-30 MED ORDER — INSULIN GLARGINE 100 UNIT/ML ~~LOC~~ SOLN
18.0000 [IU] | Freq: Every day | SUBCUTANEOUS | Status: DC
  Administered 2016-04-30: 18 [IU] via SUBCUTANEOUS
  Filled 2016-04-30 (×2): qty 0.18

## 2016-04-30 NOTE — Progress Notes (Signed)
Pt has remained A & O x 4 with no c/o pain. Lung sounds are clear to auscultation, sating 100% on RA. NSR on cardiac monitor. Insulin gtt weaned off at 0800. Discharge orders to home per Dr Darvin Neighbours. Will review importance of hydration and sick day rules with pt.

## 2016-04-30 NOTE — Discharge Instructions (Signed)

## 2016-04-30 NOTE — Progress Notes (Signed)
Inpatient Diabetes Program Recommendations  AACE/ADA: New Consensus Statement on Inpatient Glycemic Control (2015)  Target Ranges:  Prepandial:   less than 140 mg/dL      Peak postprandial:   less than 180 mg/dL (1-2 hours)      Critically ill patients:  140 - 180 mg/dL   Review of Glycemic Control:  Results for Mary Haney, Mary Haney (MRN [id number]) as of 04/30/2016 13:04  Ref. Range 04/30/2016 05:21 04/30/2016 06:24 04/30/2016 07:22 04/30/2016 08:36 04/30/2016 11:39  Glucose-Capillary Latest Ref Range: 65-99 mg/dL 145 (H) 96 98 129 (H) 280 (H)   Diabetes history: Diabetes type  Outpatient Diabetes medications: Lantus 18 units daily, Novolog 3 units tid with meals and Novolog correction starting at 141 mg/dL. Current orders for Inpatient glycemic control:  Patient is currently transitioning off insulin drip to SQ Lantus/Novolog regimen.  Inpatient Diabetes Program Recommendations:    Spoke with patient regarding home diabetes management.  She states that she is unclear of why she went into DKA.  She was nauseas and vomited several times and then was unable to control her blood sugars.  She also states that she was unclear how much insulin to take when she was not able to eat.  Discussed dehydration and how insulin cannot be absorbed as well when she is dehydrated.  Also discussed the use of the Novolog correction scale when she is unable to eat in order to correct elevated blood sugars. Will ask RN to further review sick day rules with patient.  She see's Dr. Carroll Kinds but has seen Dr. Cruzita Lederer in the past and is interested in seeing her again.  Patient states that her blood sugars range between 135-180 mg/dL.  She has her supplies and insulin available at home.     Current A1C not available. Consider checking while in the hospital.   MD, please consider also adding Novolog meal coverage 3 units tid with meals- to cover CHO intake.    Thanks, Adah Perl, RN, BC-ADM Inpatient Diabetes  Coordinator Pager [phone] (8a-5p)

## 2016-04-30 NOTE — Progress Notes (Signed)
eLink Physician-Brief Progress Note Patient Name: Mary Haney DOB: 08-10-1971 MRN: [id number]   Date of Service  04/30/2016  HPI/Events of Note  DKA DM-2  eICU Interventions  Rx per protocol Empiric abx - low threshold to stop once clinically improved     Intervention Category Evaluation Type: New Patient Evaluation  ALVA,RAKESH V. 04/30/2016, 12:17 AM

## 2016-05-04 LAB — CULTURE, BLOOD (ROUTINE X 2)
Culture: NO GROWTH
Culture: NO GROWTH

## 2016-05-07 ENCOUNTER — Encounter: Payer: Self-pay | Admitting: Family Medicine

## 2016-05-07 ENCOUNTER — Ambulatory Visit (INDEPENDENT_AMBULATORY_CARE_PROVIDER_SITE_OTHER): Payer: BLUE CROSS/BLUE SHIELD | Admitting: Family Medicine

## 2016-05-07 VITALS — BP 100/62 | HR 100 | Temp 98.2°F | Ht 63.0 in | Wt 115.4 lb

## 2016-05-07 DIAGNOSIS — B373 Candidiasis of vulva and vagina: Secondary | ICD-10-CM

## 2016-05-07 DIAGNOSIS — IMO0001 Reserved for inherently not codable concepts without codable children: Secondary | ICD-10-CM

## 2016-05-07 DIAGNOSIS — E1165 Type 2 diabetes mellitus with hyperglycemia: Secondary | ICD-10-CM

## 2016-05-07 DIAGNOSIS — Z794 Long term (current) use of insulin: Secondary | ICD-10-CM | POA: Diagnosis not present

## 2016-05-07 DIAGNOSIS — N179 Acute kidney failure, unspecified: Secondary | ICD-10-CM

## 2016-05-07 DIAGNOSIS — B3731 Acute candidiasis of vulva and vagina: Secondary | ICD-10-CM | POA: Insufficient documentation

## 2016-05-07 LAB — COMPREHENSIVE METABOLIC PANEL
ALT: 11 U/L (ref 0–35)
AST: 16 U/L (ref 0–37)
Albumin: 4.1 g/dL (ref 3.5–5.2)
Alkaline Phosphatase: 43 U/L (ref 39–117)
BILIRUBIN TOTAL: 0.3 mg/dL (ref 0.2–1.2)
BUN: 8 mg/dL (ref 6–23)
CO2: 29 meq/L (ref 19–32)
CREATININE: 0.61 mg/dL (ref 0.40–1.20)
Calcium: 9.1 mg/dL (ref 8.4–10.5)
Chloride: 98 mEq/L (ref 96–112)
GFR: 137.31 mL/min (ref >60.00)
GLUCOSE: 164 mg/dL — AB (ref 70–99)
Potassium: 4.1 mEq/L (ref 3.5–5.1)
Sodium: 133 mEq/L — ABNORMAL LOW (ref 135–145)
TOTAL PROTEIN: 6.5 g/dL (ref 6.0–8.3)

## 2016-05-07 LAB — MICROALBUMIN / CREATININE URINE RATIO
CREATININE, U: 55.3 mg/dL
Microalb Creat Ratio: 1.3 mg/g (ref 0.0–30.0)

## 2016-05-07 LAB — LIPID PANEL
CHOL/HDL RATIO: 2
Cholesterol: 145 mg/dL (ref 0–200)
HDL: 67.6 mg/dL (ref >39.00)
LDL Cholesterol: 51 mg/dL (ref 0–99)
NONHDL: 76.97
Triglycerides: 131 mg/dL (ref 0.0–149.0)
VLDL: 26.2 mg/dL (ref 0.0–40.0)

## 2016-05-07 LAB — HEMOGLOBIN A1C: HEMOGLOBIN A1C: 10.3 % — AB (ref 4.6–6.5)

## 2016-05-07 MED ORDER — FLUCONAZOLE 150 MG PO TABS
150.0000 mg | ORAL_TABLET | Freq: Once | ORAL | Status: DC
Start: 2016-05-07 — End: 2016-08-07

## 2016-05-07 NOTE — Progress Notes (Signed)
Subjective:  Patient ID: Mary Haney, female    DOB: [birth year]  Age: 45 y.o. MRN: [id number]  CC: Hospital follow up  HPI:  45 year old Female with a past medical history of type 2 diabetes, tobacco abuse, and asthma presents for hospital follow-up.  Patient was admitted on 5/23. Her discharge summary is unavailable. I'm not sure why. It appears that she was discharged on 5/24 the EMR and her report. Hospital course summarized the best my ability as below.  Patient admitted on 5/23 for nausea, vomiting, and hyperglycemia. She was found to be in DKA. Patient also had leukocytosis and evidence of acute kidney injury. Sepsis was suspected. Patient was placed on an insulin drip and was admitted to the hospitalist service. She was placed on broad-spectrum antibiotics and insulin drip was continued. It appears that no source for sepsis was found and her white count improved. AKI improved with hydration. Anion gap closed and  insulin was subsequently discontinued. It appears that she was subsequently discharged. Once again, discharge summary available.  Patient presents today for follow-up. She states that she's feeling well. She states that she is compliant with insulin although she admits to not doing so well in the recent past. She takes Lantus 18 units nightly. She takes NovoLog 3 units 3 times a day before meals and also has a sliding scale (1 unit for every 50 over 120). She states that her blood sugars have been ranging from the 50s to 180. She reports good appetite. No recent nausea or vomiting.  Additionally, patient reports that she has been having these infections more frequently. She states that this occurs particularly after her menstrual cycle. She is unsure of why this is been happening. She would like to discuss treatment options today.  Social Hx   Social History   Social History  . Marital Status: Single    Spouse Name: N/A  . Number of Children: 0  . Years of Education: 14     Occupational History  . 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 Smoking status: Current Every Day Smoker -- 0.50 packs/day for 25 years    Types: Cigarettes  . Smokeless tobacco: Never Used     Comment: Has Chantix  . Alcohol Use: 1.8 oz/week    3 Cans of beer per week  . Drug Use: No  . Sexual Activity: Yes    Birth Control/ Protection: Condom   Other Topics Concern  . None   Social History Narrative   Tovah was born in [place], [place]. She moved with her family to [place] at age 31. She attended 2 years at [employer] and was majoring in [place]. Her goal is to become a Optometrist in the [employer]. 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 Lilliannah lives with her boyfriend, [name]. They are considering getting married soon. She enjoys sketching outdoor scenery, cartoons and loves to read.   Review of Systems  Constitutional: Negative.   Gastrointestinal: Negative.    Objective:  BP 100/62 mmHg  Pulse 100  Temp(Src) 98.2 F (36.8 C) (Oral)  Ht 5\' 3"  (1.6 m)  Wt 115 lb 6 oz (52.334 kg)  BMI 20.44 kg/m2  SpO2 97%  LMP 04/22/2016 (Approximate)  BP/Weight 05/07/2016 04/30/2016 [id number]  Systolic BP 123XX123 [id number] -  Diastolic BP 62 71 -  Wt. (Lbs) 115.38 - 111.55  BMI 20.44 - 19.77  Physical Exam  Constitutional: She is oriented to person, place, and time. She appears well-developed. No distress.  HENT:  Mouth/Throat: Oropharynx is clear and moist.  Cardiovascular: Regular rhythm.  Tachycardia present.   Pulmonary/Chest: Effort normal. She has no wheezes. She has no rales.  Abdominal: Soft. She exhibits no distension. There is no tenderness. There is no rebound and no guarding.  Neurological: She is alert and oriented to person, place, and time.  Psychiatric: She has a normal mood and affect.  Vitals reviewed.  Lab Results  Component Value Date   WBC 10.7 04/30/2016   HGB 11.7* 04/30/2016   HCT 33.9*  04/30/2016   PLT 283 04/30/2016   GLUCOSE 120* 04/30/2016   CHOL 159 04/26/2015   TRIG 59.0 04/26/2015   HDL 79.70 04/26/2015   LDLCALC 68 04/26/2015   ALT 17 04/29/2016   AST 34 04/29/2016   NA 134* 04/30/2016   K 3.5 04/30/2016   CL 108 04/30/2016   CREATININE 0.63 04/30/2016   BUN 9 04/30/2016   CO2 17* 04/30/2016   TSH 1.05 06/12/2012   HGBA1C 7.8* 04/03/2015   MICROALBUR <0.7 04/03/2015   Assessment & Plan:   Problem List Items Addressed This Visit    Yeast vaginitis    New Problem. Recurrent. Secondary to uncontrolled DM 2. Treating with Diflucan. Advised compliance with insulin.      Relevant Medications   fluconazole (DIFLUCAN) 150 MG tablet   Diabetes type 2, uncontrolled ([place]) - Primary    Established problem, recent worsening of control. Recently admitted for DKA. It appears that her sugars were not well controlled and then this was exacerbated by illness which led to DKA. Obtaining A1c today and urine microalbumin today. Advise compliance with her insulin regimen.        Relevant Orders   Comprehensive metabolic panel   Lipid Profile   HgB A1c   Urine Microalbumin w/creat. ratio   AKI (acute kidney injury) ([place])    Improved during admission. Repeating labs today.         Meds ordered this encounter  Medications  . fluconazole (DIFLUCAN) 150 MG tablet    Sig: Take 1 tablet (150 mg total) by mouth once. May repeat x 1 in 72 hours.    Dispense:  10 tablet    Refill:  0    Follow-up: 3 months  [place] DO [facility]

## 2016-05-07 NOTE — Discharge Summary (Signed)
[facility] NAME: Mary Haney    MR#:  [id number]  DATE OF BIRTH:  1971/03/13  DATE OF ADMISSION:  04/29/2016 ADMITTING PHYSICIAN: Lance Coon, MD  DATE OF DISCHARGE: 04/30/2016  5:00 PM  PRIMARY CARE PHYSICIAN: Coral Spikes, DO   ADMISSION DIAGNOSIS:  Hyperkalemia [E87.5] Renal insufficiency [N28.9] Diabetic ketoacidosis without coma associated with type 2 diabetes mellitus ([place]) [E13.10]  DISCHARGE DIAGNOSIS:  Active Problems:   Asthma   AKI (acute kidney injury) ([employer])   DKA (diabetic ketoacidoses) ([place])   SECONDARY DIAGNOSIS:   Past Medical History  Diagnosis Date  . Asthma     Has albuterol inhaler  . Diabetes mellitus without complication ([place])     Type 2 diagnosed 2013  . Alcoholic pancreatitis      ADMITTING HISTORY  Mary Haney is a 45 y.o. female who presents with Nausea and vomiting, hyperglycemia. Patient states that for the past 2 days she's had significant nausea and vomiting with abdominal pain. She states that her blood sugars have been running high at home, that she's been trying to correct him with insulin, but did not want to take too much as she was ordered to Her blood sugar too far. She came to the ED for evaluation and was found to be in DKA with leukocytosis. She was treated initially for DKA and sepsis, and hospitalists were called for admission.  HOSPITAL COURSE:   * DKA Patient was treated with aggressive IV fluid resuscitation and IV insulin drip. Electrolytes replaced. Patient improved well and later was transitioned to her home dose of insulin. Restart on diet. Blood sugars are improved. DKA has resolved.  She did have significantly elevated WBC and tachycardia initially thought to be possible sepsis. But with hydration this resolved on its own. Antibiotics discontinued. Cultures have been negative.  Patient feels back to normal and has been transitioned to home dose of  insulin and discharged home in stable condition.  CONSULTS OBTAINED:     DRUG ALLERGIES:  No Known Allergies  DISCHARGE MEDICATIONS:   Discharge Medication List as of 04/30/2016  4:03 PM    CONTINUE these medications which have NOT CHANGED   Details  albuterol (PROAIR HFA) 108 (90 Base) MCG/ACT inhaler Inhale 2 puffs into the lungs every 6 (six) hours as needed for wheezing or shortness of breath., Starting 01/31/2016, Until Discontinued, Normal    cetirizine (ZYRTEC) 10 MG tablet Take 10 mg by mouth daily as needed for allergies. , Until Discontinued, Historical Med    insulin aspart (NOVOLOG FLEXPEN) 100 UNIT/ML FlexPen Inject 3 Units into the skin See admin instructions. Pt uses three units three times daily with meals and one unit plus sliding scale with snacks., Until Discontinued, Historical Med    insulin glargine (LANTUS) 100 UNIT/ML injection Inject 18 Units into the skin at bedtime., Until Discontinued, Historical Med    triamcinolone ointment (KENALOG) 0.1 % Apply 1 application topically daily as needed (for eczema)., Until Discontinued, Historical Med    buPROPion (WELLBUTRIN XL) 150 MG 24 hr tablet Take 1 tablet (150 mg total) by mouth daily., Starting 02/08/2016, Until Discontinued, Normal    chlorpheniramine-HYDROcodone (TUSSIONEX PENNKINETIC ER) 10-8 MG/5ML SUER Take 5 mLs by mouth at bedtime as needed for cough., Starting 02/08/2016, Until Discontinued, Print    doxycycline (VIBRA-TABS) 100 MG tablet Take 1 tablet (100 mg total) by mouth 2 (two) times daily., Starting 02/08/2016, Until Discontinued, Normal    fluconazole (DIFLUCAN)  150 MG tablet Take 1 tablet (150 mg total) by mouth once., Starting 03/07/2016, Normal    nicotine (NICODERM CQ - DOSED IN MG/24 HOURS) 21 mg/24hr patch Place 1 patch (21 mg total) onto the skin daily., Starting 02/08/2016, Until Discontinued, Normal        Today   VITAL SIGNS:  Blood pressure 106/71, pulse 72, temperature 98 F (36.7 C),  temperature source Oral, resp. rate 21, height 5\' 3"  (1.6 m), weight 50.6 kg (111 lb 8.8 oz), last menstrual period 04/22/2016, SpO2 100 %.  I/O:  No intake or output data in the 24 hours ending 05/07/16 1602  PHYSICAL EXAMINATION:  Physical Exam  GENERAL:  45 y.o.-year-old patient lying in the bed with no acute distress.  LUNGS: Normal breath sounds bilaterally, no wheezing, rales,rhonchi or crepitation. No use of accessory muscles of respiration.  CARDIOVASCULAR: S1, S2 normal. No murmurs, rubs, or gallops.  ABDOMEN: Soft, non-tender, non-distended. Bowel sounds present. No organomegaly or mass.  NEUROLOGIC: Moves all 4 extremities. PSYCHIATRIC: The patient is alert and oriented x 3.  SKIN: No obvious rash, lesion, or ulcer.   DATA REVIEW:   CBC No results for input(s): WBC, HGB, HCT, PLT in the last 168 hours.  Chemistries   Recent Labs Lab 05/07/16 1026  NA 133*  K 4.1  CL 98  CO2 29  GLUCOSE 164*  BUN 8  CREATININE 0.61  CALCIUM 9.1  AST 16  ALT 11  ALKPHOS 43  BILITOT 0.3    Cardiac Enzymes No results for input(s): TROPONINI in the last 168 hours.  Microbiology Results  Results for orders placed or performed during the hospital encounter of 04/29/16  Blood Culture (routine x 2)     Status: None   Collection Time: 04/29/16  8:20 PM  Result Value Ref Range Status   Specimen Description BLOOD LEFT ARM  Final   Special Requests BOTTLES DRAWN AEROBIC AND ANAEROBIC 10ML  Final   Culture NO GROWTH 5 DAYS  Final   Report Status 05/04/2016 FINAL  Final  Blood Culture (routine x 2)     Status: None   Collection Time: 04/29/16  8:20 PM  Result Value Ref Range Status   Specimen Description BLOOD RIGHT ANTECUBITAL  Final   Special Requests BOTTLES DRAWN AEROBIC AND ANAEROBIC 10ML  Final   Culture NO GROWTH 5 DAYS  Final   Report Status 05/04/2016 FINAL  Final  Rapid Influenza A&B Antigens ([facility] only)     Status: None   Collection Time: 04/29/16  8:30 PM  Result  Value Ref Range Status   Influenza A ([place]) NEGATIVE NEGATIVE Final   Influenza B ([facility]) NEGATIVE NEGATIVE Final  Urine culture     Status: Abnormal   Collection Time: 04/29/16  8:30 PM  Result Value Ref Range Status   Specimen Description URINE, RANDOM  Final   Special Requests NONE  Final   Culture MULTIPLE SPECIES PRESENT, SUGGEST RECOLLECTION (A)  Final   Report Status 04/30/2016 FINAL  Final  MRSA PCR Screening     Status: None   Collection Time: 04/29/16 11:40 PM  Result Value Ref Range Status   MRSA by PCR NEGATIVE NEGATIVE Final    Comment:        The GeneXpert MRSA Assay (FDA approved for NASAL specimens only), is one component of a comprehensive MRSA colonization surveillance program. It is not intended to diagnose MRSA infection nor to guide or monitor treatment for MRSA infections.     RADIOLOGY:  No results found.  Follow up with PCP in 1 week.  Management plans discussed with the patient, family and they are in agreement.  CODE STATUS:  Code Status History    Date Active Date Inactive Code Status Order ID Comments User Context   04/29/2016 11:34 PM 04/30/2016  8:28 PM Full Code [id number]  Lance Coon, MD Inpatient      TOTAL TIME TAKING CARE OF THIS PATIENT ON DAY OF DISCHARGE: more than 30 minutes.   Hillary Bow R M.D on 05/07/2016 at 4:02 PM  Between 7am to 6pm - Pager - [phone]  After 6pm go to [email or web address] - password [email or web address] [facility]  Office  [phone]  CC: Primary care physician; Coral Spikes, DO  Note: This dictation was prepared with Dragon dictation along with smaller phrase technology. Any transcriptional errors that result from this process are unintentional.

## 2016-05-07 NOTE — Assessment & Plan Note (Signed)
Improved during admission. Repeating labs today.

## 2016-05-07 NOTE — Patient Instructions (Signed)
Continue your current insulin regimen.  We will call regarding your labs.  I have sent in Diflucan for the yeast infections (this will improve with control of her diabetes).  Follow up in 3 months.   Take care  Dr. Lacinda Axon

## 2016-05-07 NOTE — Assessment & Plan Note (Signed)
New Problem. Recurrent. Secondary to uncontrolled DM 2. Treating with Diflucan. Advised compliance with insulin.

## 2016-05-07 NOTE — Assessment & Plan Note (Signed)
Established problem, recent worsening of control. Recently admitted for DKA. It appears that her sugars were not well controlled and then this was exacerbated by illness which led to DKA. Obtaining A1c today and urine microalbumin today. Advise compliance with her insulin regimen.

## 2016-07-03 ENCOUNTER — Other Ambulatory Visit: Payer: Self-pay | Admitting: Nurse Practitioner

## 2016-07-09 ENCOUNTER — Other Ambulatory Visit: Payer: Self-pay | Admitting: *Deleted

## 2016-07-09 NOTE — Telephone Encounter (Signed)
Okay to refill? 

## 2016-07-09 NOTE — Telephone Encounter (Signed)
You have not prescribed was last filled in the beginning of 2017 by NP.  Please advise.  It is used for execma.  thanks

## 2016-07-09 NOTE — Telephone Encounter (Signed)
Patient has requested to have her triamcilone ointment .1% Pt contact [phone]

## 2016-07-09 NOTE — Telephone Encounter (Signed)
rx sent, left VM for patient to notify, thanks

## 2016-07-10 ENCOUNTER — Other Ambulatory Visit: Payer: Self-pay | Admitting: Family Medicine

## 2016-07-10 MED ORDER — INSULIN ASPART 100 UNIT/ML FLEXPEN: 3.0000 [IU] | PEN_INJECTOR | Freq: Three times a day (TID) | SUBCUTANEOUS | 0 refills | Status: DC

## 2016-07-10 MED ORDER — ALBUTEROL SULFATE HFA 108 (90 BASE) MCG/ACT IN AERS: 2.0000 | INHALATION_SPRAY | Freq: Four times a day (QID) | RESPIRATORY_TRACT | 1 refills | Status: DC | PRN

## 2016-07-10 NOTE — Telephone Encounter (Signed)
Historical medication. Please advise? 

## 2016-07-10 NOTE — Telephone Encounter (Signed)
Pt called stating that the pharmacy does not have pt Rx.   [id number] [address], [place]  Call pt @ [phone]. Thank you!

## 2016-07-11 MED ORDER — NYSTATIN-TRIAMCINOLONE 100000-0.1 UNIT/GM-% EX OINT: TOPICAL_OINTMENT | CUTANEOUS | 3 refills | Status: DC

## 2016-08-07 ENCOUNTER — Ambulatory Visit (INDEPENDENT_AMBULATORY_CARE_PROVIDER_SITE_OTHER): Payer: BLUE CROSS/BLUE SHIELD | Admitting: Family Medicine

## 2016-08-07 ENCOUNTER — Other Ambulatory Visit: Payer: Self-pay | Admitting: Family Medicine

## 2016-08-07 ENCOUNTER — Encounter: Payer: Self-pay | Admitting: Family Medicine

## 2016-08-07 VITALS — BP 125/82 | HR 110 | Temp 98.3°F | Wt 120.5 lb

## 2016-08-07 DIAGNOSIS — IMO0001 Reserved for inherently not codable concepts without codable children: Secondary | ICD-10-CM

## 2016-08-07 DIAGNOSIS — E1165 Type 2 diabetes mellitus with hyperglycemia: Secondary | ICD-10-CM | POA: Diagnosis not present

## 2016-08-07 DIAGNOSIS — F17210 Nicotine dependence, cigarettes, uncomplicated: Secondary | ICD-10-CM | POA: Diagnosis not present

## 2016-08-07 DIAGNOSIS — Z794 Long term (current) use of insulin: Secondary | ICD-10-CM

## 2016-08-07 LAB — HEMOGLOBIN A1C: Hgb A1c MFr Bld: 10.8 % — ABNORMAL HIGH (ref 4.6–6.5)

## 2016-08-07 MED ORDER — VARENICLINE TARTRATE 1 MG PO TABS: 1.0000 mg | ORAL_TABLET | Freq: Two times a day (BID) | ORAL | 0 refills | Status: DC

## 2016-08-07 MED ORDER — INSULIN PEN NEEDLE 31G X 5 MM MISC: 11 refills | Status: DC

## 2016-08-07 MED ORDER — VARENICLINE TARTRATE 0.5 MG X 11 & 1 MG X 42 PO MISC: ORAL | 0 refills | Status: DC

## 2016-08-07 NOTE — Assessment & Plan Note (Signed)
Established problem, improving. A1C today. Continue current insulin regimen - Lantus 20 units and Novolog SSI. Foot exam performed today.

## 2016-08-07 NOTE — Patient Instructions (Signed)
Take the chantix as prescribed.  We will call with your A1c results.  Follow up in 3 months.  Take care  Dr. Lacinda Axon

## 2016-08-07 NOTE — Progress Notes (Signed)
Pre visit review using our clinic review tool, if applicable. No additional management support is needed unless otherwise documented below in the visit note. 

## 2016-08-07 NOTE — Assessment & Plan Note (Signed)
Tobacco cessation counseling  Ask: Asked about smoking.  Advise: Advised to quit.  Assess: Patient ready to quit and interested.  Assist: Discussed treatment options - Nicotine replacement, Chantix, Wellbutrin and potential side effects. Patient elected to start Chantix.  Arrange follow up: 3 months.  Time spent: 3 mins.

## 2016-08-07 NOTE — Progress Notes (Signed)
Subjective:  Patient ID: Mary Haney, female    DOB: 12/04/1971  Age: 45 y.o. MRN: [id number]  CC: Follow up  HPI:  45 year old female with uncontrolled type 2 diabetes and tobacco abuse presents for follow-up.  DM  Blood sugars readings - Still not at goal, but improving. 73 this am. Highest was 270.  Hypoglycemia - No.  Medications - Lantus 20 units, Novolog sliding scale (typically 5-6 units with meals).  Adverse effects - Yes.   Compliance - Yes.  Preventative care  Eye exam - Up to date.  Foot exam - In need of today.  Urine microalbumin - Up to date.   Tobacco abuse  Patient interested in smoking cessation and ready to quit.  Will discuss treatment options today.  Social Hx   Social History   Social History  . Marital status: Single    Spouse name: N/A  . Number of children: 0  . Years of education: 14   Occupational History  . 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 Smoking status: Current Every Day Smoker    Packs/day: 0.50    Years: 25.00    Types: Cigarettes  . Smokeless tobacco: Never Used     Comment: Has Chantix  . Alcohol use 1.8 oz/week    3 Cans of beer per week  . Drug use: No  . Sexual activity: Yes    Birth control/ protection: Condom   Other Topics Concern  . None   Social History Narrative   Mary Haney was born in [place], [place]. She moved with her family to [place] at age 15. She attended 2 years at [employer] and was majoring in [place]. Her goal is to become a Optometrist in the [employer]. 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 Keiara lives with her boyfriend, [name]. They are considering getting married soon. She enjoys sketching outdoor scenery, cartoons and loves to read.   Review of Systems  Constitutional: Negative.   Respiratory: Negative.   Cardiovascular: Negative.    Objective:  BP 125/82 (BP Location: Right Arm, Patient  Position: Sitting, Cuff Size: Normal)   Pulse (!) 110   Temp 98.3 F (36.8 C) (Oral)   Wt 120 lb 8 oz (54.7 kg)   SpO2 100%   BMI 21.35 kg/m   BP/Weight 08/07/2016 05/07/2016 [id number]  Systolic BP 0000000 [id number] [id number]  Diastolic BP 82 62 71  Wt. (Lbs) 120.5 115.38 -  BMI 21.35 20.44 -   Physical Exam  Constitutional: She is oriented to person, place, and time. She appears well-developed. No distress.  Cardiovascular: Regular rhythm.  Tachycardia present.   Pulmonary/Chest: Effort normal. She has no wheezes. She has no rales.  Abdominal: Soft. She exhibits no distension. There is no tenderness.  Neurological: She is alert and oriented to person, place, and time.  Psychiatric: She has a normal mood and affect.  Vitals reviewed. Diabetic Foot Check -  Appearance - no lesions, ulcers or calluses Skin - no unusual pallor or redness Monofilament testing -  Right - Great toe, medial, central, lateral ball and posterior foot intact Left - Great toe, medial, central, lateral ball and posterior foot intact  Lab Results  Component Value Date   WBC 10.7 04/30/2016   HGB 11.7 (L) 04/30/2016   HCT 33.9 (L) 04/30/2016   PLT 283 04/30/2016   GLUCOSE 164 (H) 05/07/2016   CHOL 145 05/07/2016  TRIG 131.0 05/07/2016   HDL 67.60 05/07/2016   LDLCALC 51 05/07/2016   ALT 11 05/07/2016   AST 16 05/07/2016   NA 133 (L) 05/07/2016   K 4.1 05/07/2016   CL 98 05/07/2016   CREATININE 0.61 05/07/2016   BUN 8 05/07/2016   CO2 29 05/07/2016   TSH 1.05 06/12/2012   HGBA1C 10.3 (H) 05/07/2016   MICROALBUR <0.7 05/07/2016   Assessment & Plan:   Problem List Items Addressed This Visit    Diabetes type 2, uncontrolled ([place]) - Primary    Established problem, improving. A1C today. Continue current insulin regimen - Lantus 20 units and Novolog SSI. Foot exam performed today.      Relevant Orders   HgB A1c   Nicotine dependence    Tobacco cessation counseling  Ask: Asked about smoking.  Advise:  Advised to quit.  Assess: Patient ready to quit and interested.  Assist: Discussed treatment options - Nicotine replacement, Chantix, Wellbutrin and potential side effects. Patient elected to start Chantix.  Arrange follow up: 3 months.  Time spent: 3 mins.         Relevant Medications   varenicline (CHANTIX CONTINUING MONTH PAK) 1 MG tablet   varenicline (CHANTIX STARTING MONTH PAK) 0.5 MG X 11 & 1 MG X 42 tablet    Other Visit Diagnoses   None.     Meds ordered this encounter  Medications  . Insulin Pen Needle 31G X 5 MM MISC    Sig: Use when taking insulin.    Dispense:  100 each    Refill:  11  . varenicline (CHANTIX CONTINUING MONTH PAK) 1 MG tablet    Sig: Take 1 tablet (1 mg total) by mouth 2 (two) times daily.    Dispense:  120 tablet    Refill:  0  . varenicline (CHANTIX STARTING MONTH PAK) 0.5 MG X 11 & 1 MG X 42 tablet    Sig: Take one 0.5 mg tablet by mouth once daily for 3 days, then increase to one 0.5 mg tablet twice daily for 4 days, then increase to one 1 mg tablet twice daily.    Dispense:  53 tablet    Refill:  0    Follow-up: Return in about 3 months (around 11/06/2016) for Diabetes follow up.  [place]

## 2016-08-13 ENCOUNTER — Telehealth: Payer: Self-pay | Admitting: Family Medicine

## 2016-08-13 NOTE — Telephone Encounter (Signed)
Opened in error

## 2016-09-23 ENCOUNTER — Encounter: Payer: Self-pay | Admitting: Internal Medicine

## 2016-09-23 ENCOUNTER — Ambulatory Visit (INDEPENDENT_AMBULATORY_CARE_PROVIDER_SITE_OTHER): Payer: BLUE CROSS/BLUE SHIELD | Admitting: Internal Medicine

## 2016-09-23 VITALS — BP 122/78 | HR 110 | Ht 63.5 in | Wt 113.0 lb

## 2016-09-23 DIAGNOSIS — E1310 Other specified diabetes mellitus with ketoacidosis without coma: Secondary | ICD-10-CM

## 2016-09-23 DIAGNOSIS — Z794 Long term (current) use of insulin: Secondary | ICD-10-CM

## 2016-09-23 DIAGNOSIS — E1110 Type 2 diabetes mellitus with ketoacidosis without coma: Secondary | ICD-10-CM

## 2016-09-23 NOTE — Progress Notes (Signed)
Patient ID: Mary Haney, female   DOB: 03/19/1971, 45 y.o.   MRN: [id number]  HPI: Mary Haney is a 45 y.o.-year-old female, returning for f/u for DM2, dx 2013, insulin-dependent, uncontrolled, without complications. Last visit 12/2013.  She is more depressed lately >> drinks more alcohol.  She was admitted in 04/2016 for DKA.  Reviewed hx: She did not have a primary care physician. In August 2013 she walked in to the ED because she was not feeling well. She was found to have a blood glucose level of 600 + and was admitted with DKA. She was followed by [name] immediately following initial diagnosis. In 2015, she told me she would like to establish care with [facility] endocrinology to keep everything within the same health system.   In 10/2013, due to lack of finances, she was stretching the insulin doses >> sugars high >> low CBG 25-26 >> ED.   Reviewed HbA1c levels: Lab Results  Component Value Date   HGBA1C 10.8 (H) 08/07/2016   HGBA1C 10.3 (H) 05/07/2016   HGBA1C 7.8 (H) 04/03/2015   Pt is on a regimen of: - Lantus 18 units at bedtime (vial) - no missed doses - Novolog 3 units tid ac + SSI: target 120, ISF 50 (1 unit with a snack)  Pt checks her sugars 2-3x a day and they are - no log, no meter: - am: 90-140  (~1-2x a week: 200-300 - unexplained) >> 60s, 189-217 (3 units) - 2h after b'fast: n/c (1 unit- snack) - before lunch: 123-140 >> n/c (busy) (3-4 units) - 2h after lunch: n/c  - before dinner: 80-100 >> 89-118 (4-5 units) - 2h after dinner: n/c  (1 unit - snack) - bedtime: n/c >> 130s - nighttime: n/c No lows - see above. Lowest sugar was 55-60 when delays a meal >> 38 x1 at waking up; she has hypoglycemia awareness at 60.  Highest sugar was 300 >> 400s.  Has ReliOn meter.  Pt's meals are: - Breakfast: oatmeal, yoghurt, sausage bisquit - Lunch: salad, soup, sandwiches - Dinner: meat + starch + vegetables - Snacks: yoghurt, peanut crackers, nuts  - no  CKD Lab Results  Component Value Date   BUN 8 05/07/2016   Lab Results  Component Value Date   CREATININE 0.61 05/07/2016   - no HL Lab Results  Component Value Date   CHOL 145 05/07/2016   HDL 67.60 05/07/2016   LDLCALC 51 05/07/2016   TRIG 131.0 05/07/2016   CHOLHDL 2 05/07/2016   - last eye exam was in 11/2015. No DR.  - no numbness and tingling in her feet. Foot exam normal, checked by PCP 07/2016.  ROS: Constitutional:+ weight /loss, + fatigue, no subjective hyperthermia/hypothermia Eyes: no blurry vision, no xerophthalmia ENT: no sore throat, no nodules palpated in throat, no dysphagia/odynophagia, no hoarseness Cardiovascular: no CP/SOB/palpitations/leg swelling Respiratory: no cough/no SOB Gastrointestinal: + N/+ V/no D/C Musculoskeletal: no muscle/joint aches Skin: no rash  Neurological: no tremors/numbness/tingling/dizziness  I reviewed pt's medications, allergies, PMH, social hx, family hx, and changes were documented in the history of present illness. Otherwise, unchanged from my initial visit note.  Past Medical History:  Diagnosis Date  . Alcoholic pancreatitis   . Asthma    Has albuterol inhaler  . Diabetes mellitus without complication ([place])    Type 2 diagnosed 2013   Past Surgical History:  Procedure Laterality Date  . NO PAST SURGERIES     History   Social History  . Marital Status: Single  Spouse Name: N/A    Number of Children: 0  . Years of Education: 14   Occupational History  . Take Out Specialist     [place]   Social History Main Topics  . Smoking status: Current Every Day Smoker -- 0.50 packs/day for 25 years    Types: Cigarettes  . Smokeless tobacco: Never Used  . Alcohol Use: 1.8 oz/week    3 Cans of beer per week  . Drug Use: No  . Sexual Activity: Yes    Birth Control/ Protection: Condom   Social History Narrative   Persayus was born in [place], [place]. She moved with her family to [place] at age 38. She attended  2 years at [employer] and was majoring in [place]. Her goal is to become a Optometrist in the [employer]. 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 Mary Haney lives with her boyfriend, [name]. They are considering getting married soon. She enjoys sketching outdoor scenery, cartoons and loves to read.   Current Outpatient Prescriptions on File Prior to Visit  Medication Sig Dispense Refill  . albuterol (PROAIR HFA) 108 (90 Base) MCG/ACT inhaler Inhale 2 puffs into the lungs every 6 (six) hours as needed for wheezing or shortness of breath. 1 Inhaler 1  . cetirizine (ZYRTEC) 10 MG tablet Take 10 mg by mouth daily as needed for allergies.     Marland Kitchen insulin aspart (NOVOLOG FLEXPEN) 100 UNIT/ML FlexPen Inject 3 Units into the skin 3 (three) times daily with meals. Per sliding scale 15 mL 0  . Insulin Pen Needle 31G X 5 MM MISC Use when taking insulin. 100 each 11  . LANTUS 100 UNIT/ML injection INJECT 20 UNITS SUBCUTANEOUSLY AT BEDTIME 10 mL 1  . varenicline (CHANTIX CONTINUING MONTH PAK) 1 MG tablet Take 1 tablet (1 mg total) by mouth 2 (two) times daily. 120 tablet 0  . varenicline (CHANTIX STARTING MONTH PAK) 0.5 MG X 11 & 1 MG X 42 tablet Take one 0.5 mg tablet by mouth once daily for 3 days, then increase to one 0.5 mg tablet twice daily for 4 days, then increase to one 1 mg tablet twice daily. 53 tablet 0   No current facility-administered medications on file prior to visit.    No Known Allergies Family History  Problem Relation Age of Onset  . Diabetes Father   . Depression Paternal Grandmother   . Depression Paternal Grandfather   . Depression Cousin    PE: BP 122/78   Pulse (!) 110   Ht 5' 3.5" (1.613 m)   Wt 113 lb (51.3 kg)   SpO2 98%   BMI 19.70 kg/m  Wt Readings from Last 3 Encounters:  09/23/16 113 lb (51.3 kg)  08/07/16 120 lb 8 oz (54.7 kg)  05/07/16 115 lb 6 oz (52.3 kg)   Constitutional: normal weight, in NAD Eyes: PERRLA, EOMI, no  exophthalmos ENT: moist mucous membranes, no thyromegaly, no cervical lymphadenopathy Cardiovascular: tachycardia, RR, No MRG Respiratory: CTA B Gastrointestinal: abdomen soft, NT, ND, BS+ Musculoskeletal: no deformities, strength intact in all 4 Skin: moist, warm, no rashes Neurological: no tremor with outstretched hands, DTR normal in all 4  ASSESSMENT: 1. DM2, non-insulin-dependent, uncontrolled, without complications  PLAN:  1. Patient with recently dx-ed DM, unclear if type 1 or 2, on basal-bolus insulin regimen,with poor control. She returns after almost 3 years. No log, no meter. Sugars are high in am, but occasionally she has lows at night  or upon waking up. She reports good CBGs during the day. Overall, sugars are discordant with her HbA1c of >105. I will therefore continue current regimen, encouraged compliance and will have her check sugars, take insulin as Rx'ed, not skip meals, and return in 1.5 mo with her log. Will make further changes then. - we need to check a C peptide and Glu >> CBG today >> HI (she is drinking a shake). We will check at next visit if sugars are better.  -  I suggested to:  Patient Instructions  Please continue: - Lantus 18 units at bedtime  Please increase Novolog 3x a day as follows: - 1 unit before a snack - 3 units before a smaller meal - 4-5 units before a larger meal   Continue Novolog SSI: target 120, ISF 50.  Please return in 1.5 months with your sugar log.   - Strongly advised her to start checking sugars at different times of the day - check 3 times a day, rotating checks - advised for yearly eye exams - Return to clinic in 1.5 mo with sugar log   - time spent with the patient: 40 min, of which >50% was spent in obtaining information about her symptoms, reviewing her previous labs, evaluations, and treatments, counseling her about her condition (please see the discussed topics above), and developing a plan to further investigate and treat  it; she had a number of questions which I addressed.   Philemon Kingdom, MD PhD [facility] Endocrinology

## 2016-09-23 NOTE — Patient Instructions (Addendum)
Please continue: - Lantus 18 units at bedtime  Please increase Novolog 3x a day as follows: - 1 unit before a snack - 3 units before a smaller meal - 4-5 units before a larger meal   Continue Novolog SSI: target 120, ISF 50.  Please return in 1.5 months with your sugar log.

## 2016-10-24 ENCOUNTER — Telehealth: Payer: Self-pay | Admitting: Family Medicine

## 2016-10-24 NOTE — Telephone Encounter (Signed)
Pt called and is requesting a referral to a podiatrist. States that her toe nails are curving even though she keeps them short.

## 2016-10-27 ENCOUNTER — Other Ambulatory Visit: Payer: Self-pay | Admitting: Family Medicine

## 2016-10-27 DIAGNOSIS — IMO0001 Reserved for inherently not codable concepts without codable children: Secondary | ICD-10-CM

## 2016-10-27 DIAGNOSIS — E1165 Type 2 diabetes mellitus with hyperglycemia: Principal | ICD-10-CM

## 2016-10-27 DIAGNOSIS — Z794 Long term (current) use of insulin: Principal | ICD-10-CM

## 2016-10-27 NOTE — Telephone Encounter (Signed)
Referral placed.

## 2016-10-27 NOTE — Telephone Encounter (Signed)
Per DPR a voicemail was left letting her know that referral was placed.

## 2016-11-03 ENCOUNTER — Other Ambulatory Visit: Payer: Self-pay | Admitting: Family Medicine

## 2016-11-04 ENCOUNTER — Encounter: Payer: Self-pay | Admitting: Internal Medicine

## 2016-11-04 ENCOUNTER — Ambulatory Visit (INDEPENDENT_AMBULATORY_CARE_PROVIDER_SITE_OTHER): Payer: BLUE CROSS/BLUE SHIELD | Admitting: Internal Medicine

## 2016-11-04 VITALS — BP 140/80 | HR 107 | Wt 119.0 lb

## 2016-11-04 DIAGNOSIS — E1165 Type 2 diabetes mellitus with hyperglycemia: Secondary | ICD-10-CM

## 2016-11-04 DIAGNOSIS — Z794 Long term (current) use of insulin: Secondary | ICD-10-CM | POA: Diagnosis not present

## 2016-11-04 DIAGNOSIS — E1065 Type 1 diabetes mellitus with hyperglycemia: Secondary | ICD-10-CM | POA: Diagnosis not present

## 2016-11-04 DIAGNOSIS — IMO0001 Reserved for inherently not codable concepts without codable children: Secondary | ICD-10-CM

## 2016-11-04 LAB — POCT GLYCOSYLATED HEMOGLOBIN (HGB A1C): HEMOGLOBIN A1C: 9

## 2016-11-04 NOTE — Patient Instructions (Addendum)
Please continue: - Lantus 18 units but move it to am  Increase: - Novolog as follows: - 1-2 units before a snack (15-30g carbs) - 4 units before a smaller meal - 5-6 units before a larger meal Continue Novolog SSI: target 120, ISF 50.  Please return in 1.5 months with your sugar log.   Please schedule an appt with Antonieta Iba with nutrition - carb counting.  Please schedule an appt with Leonia Reader for diabetes education - pre-pump training.

## 2016-11-04 NOTE — Telephone Encounter (Signed)
Last filled 08/19/16. Last OV 08/07/16. Has follow up appt on 11/06/16.

## 2016-11-04 NOTE — Telephone Encounter (Signed)
I gave a 3 month supply. Does she want to continue for another 3 months.

## 2016-11-04 NOTE — Progress Notes (Signed)
Patient ID: Mary Haney, female   DOB: May 06, 1971, 45 y.o.   MRN: [id number]  HPI: Mary Haney is a 45 y.o.-year-old female, returning for f/u for DM, dx 2013, insulin-dependent, uncontrolled, without complications. Last visit 1.5 mo ago.  Reviewed hx: In 07/2012 she went to the ED because she was not feeling well. She was found to have a blood glucose level of 600 + and was admitted with DKA. She was followed by [name] immediately following initial diagnosis, then switched to [facility] endocrinology.   In 10/2013, due to lack of finances, she was stretching the insulin doses >> sugars high >> low CBG 25-26 >> ED.    She was admitted in 04/2016 for DKA.  Reviewed HbA1c levels: Lab Results  Component Value Date   HGBA1C 10.8 (H) 08/07/2016   HGBA1C 10.3 (H) 05/07/2016   HGBA1C 7.8 (H) 04/03/2015   Pt is on a regimen of: - Lantus 18 units at bedtime - Novolog 3x a day as follows: - 1 unit before a snack - 3 units before a smaller meal - 4-5 units before a larger meal - Novolog SSI: target 120, ISF 50.  Pt checks her sugars 2-3x a day and they are - per meter download: - am: 90-140  (~1-2x a week: 200-300 - unexplained) >> 60s, 189-217 (3 units) >> 40-106, 443 - 2h after b'fast: n/c (1 unit- snack) >> 41-401 - before lunch: 123-140 >> n/c (busy) (3-4 units) >> 58-395 - 2h after lunch: n/c >> 378, 395 - before dinner: 80-100 >> 89-118 (4-5 units) >> 357-HI (snack in the pm not covered with insulin!) - 2h after dinner: n/c  (1 unit - snack) >> n/c >> 32 x1, 179, 415 - bedtime: n/c >> 130s >> n/c - nighttime: n/c Lowest sugar was 38 x1 >> 32 after dinner x1, 40 in am; she has hypoglycemia awareness at 60.  Highest sugar was 300 >> 400s >> HI.  Has ReliOn meter.  Pt's meals are: - Breakfast: oatmeal, yoghurt, sausage bisquit - Lunch: salad, soup, sandwiches - Dinner: meat + starch + vegetables - Snacks: yoghurt, peanut crackers, nuts  - no CKD Lab Results  Component  Value Date   BUN 8 05/07/2016   Lab Results  Component Value Date   CREATININE 0.61 05/07/2016   - no HL Lab Results  Component Value Date   CHOL 145 05/07/2016   HDL 67.60 05/07/2016   LDLCALC 51 05/07/2016   TRIG 131.0 05/07/2016   CHOLHDL 2 05/07/2016   - last eye exam was in 11/2015. No DR.  - no numbness and tingling in her feet. Foot exam normal, checked by PCP 07/2016.  ROS: Constitutional:no weight /loss, no fatigue, no subjective hyperthermia/hypothermia, + nocturia Eyes: no blurry vision, no xerophthalmia ENT: no sore throat, no nodules palpated in throat, no dysphagia/odynophagia, no hoarseness Cardiovascular: no CP/SOB/palpitations/leg swelling Respiratory: no cough/no SOB Gastrointestinal: no N/V/no D/C Musculoskeletal: no muscle/joint aches Skin: no rash  Neurological: no tremors/numbness/tingling/dizziness  I reviewed pt's medications, allergies, PMH, social hx, family hx, and changes were documented in the history of present illness. Otherwise, unchanged from my initial visit note.  Past Medical History:  Diagnosis Date  . Alcoholic pancreatitis   . Asthma    Has albuterol inhaler  . Diabetes mellitus without complication ([place])    Type 2 diagnosed 2013   Past Surgical History:  Procedure Laterality Date  . NO PAST SURGERIES     History   Social History  . Marital  Status: Single    Spouse Name: N/A    Number of Children: 0  . Years of Education: 14   Occupational History  . Take Out Specialist     [employer]   Social History Main Topics  . Smoking status: Current Every Day Smoker -- 0.50 packs/day for 25 years    Types: Cigarettes  . Smokeless tobacco: Never Used  . Alcohol Use: 1.8 oz/week    3 Cans of beer per week  . Drug Use: No  . Sexual Activity: Yes    Birth Control/ Protection: Condom   Social History Narrative   Mary Haney was born in [place], [place]. She moved with her family to [place] at age 31. She attended 2 years at  [employer] and was majoring in [place]. Her goal is to become a Optometrist in the [employer]. 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 Roylene lives with her boyfriend, Mary Haney. They are considering getting married soon. She enjoys sketching outdoor scenery, cartoons and loves to read.   Current Outpatient Prescriptions on File Prior to Visit  Medication Sig Dispense Refill  . albuterol (PROAIR HFA) 108 (90 Base) MCG/ACT inhaler Inhale 2 puffs into the lungs every 6 (six) hours as needed for wheezing or shortness of breath. 1 Inhaler 1  . cetirizine (ZYRTEC) 10 MG tablet Take 10 mg by mouth daily as needed for allergies.     Marland Kitchen insulin aspart (NOVOLOG FLEXPEN) 100 UNIT/ML FlexPen Inject 3 Units into the skin 3 (three) times daily with meals. Per sliding scale 15 mL 0  . Insulin Pen Needle 31G X 5 MM MISC Use when taking insulin. 100 each 11  . LANTUS 100 UNIT/ML injection INJECT 20 UNITS SUBCUTANEOUSLY AT BEDTIME 10 mL 1  . varenicline (CHANTIX CONTINUING MONTH PAK) 1 MG tablet Take 1 tablet (1 mg total) by mouth 2 (two) times daily. 120 tablet 0  . varenicline (CHANTIX STARTING MONTH PAK) 0.5 MG X 11 & 1 MG X 42 tablet Take one 0.5 mg tablet by mouth once daily for 3 days, then increase to one 0.5 mg tablet twice daily for 4 days, then increase to one 1 mg tablet twice daily. 53 tablet 0   No current facility-administered medications on file prior to visit.    No Known Allergies Family History  Problem Relation Age of Onset  . Diabetes Father   . Depression Paternal Grandmother   . Depression Paternal Grandfather   . Depression Cousin    PE: BP 140/80   Pulse (!) 107   Wt 119 lb (54 kg)   SpO2 98%   BMI 20.75 kg/m  Wt Readings from Last 3 Encounters:  11/04/16 119 lb (54 kg)  09/23/16 113 lb (51.3 kg)  08/07/16 120 lb 8 oz (54.7 kg)   Constitutional: normal weight, in NAD Eyes: PERRLA, EOMI, no exophthalmos ENT: moist mucous membranes, no  thyromegaly, no cervical lymphadenopathy Cardiovascular: tachycardia, RR, No MRG Respiratory: CTA B Gastrointestinal: abdomen soft, NT, ND, BS+ Musculoskeletal: no deformities, strength intact in all 4 Skin: moist, warm, no rashes Neurological: no tremor with outstretched hands, DTR normal in all 4  ASSESSMENT: 1. DM, insulin-dependent, uncontrolled, with complications - hyper- and hypo-glycemia - DKA  PLAN:  1. Patient with DM, unclear if type 1 or 2, on basal-bolus insulin regimen, with poor control, with significant CBG fluctuation: 38-HI. She tells me today that she has decided for an insulin pump. I will refer her  to nutrition for a carb counting and to diabetes education for a discussion about pumps. She met her deductible for her insurance this year and would like to obtain the pump as soon as possible. - Until then, since her sugars are so high before dinner, I strongly advised her to bolus with a snack in the afternoon, and we also slightly increase her NovoLog doses. Also, to avoid precipitous drop of sugars overnight, with subsequent low blood sugars in a.m., will move Lantus in a.m. - Today we will check a C peptide and Glu as her CBG is 99. Will also add GAD and ICA anti-pancreatic antibodies. - HbA1c today is better: 9.0% -  I suggested to:  Patient Instructions  Please continue: - Lantus 18 units but move it to am  Increase: - Novolog as follows: - 1-2 units before a snack (15-30g carbs) - 4 units before a smaller meal - 5-6 units before a larger meal Continue Novolog SSI: target 120, ISF 50.  Please return in 1.5 months with your sugar log.   Please schedule an appt with Antonieta Iba with nutrition - carb counting.  Please schedule an appt with Leonia Reader for diabetes education - pre-pump training.  - Strongly advised her to start checking sugars at different times of the day - check 3 times a day, rotating checks - advised for yearly eye exams - Return to  clinic in 1.5 mo with sugar log   - time spent with the patient: 40 min, of which >50% was spent in reviewing her meter downloads, discussing her hypo- and hyper-glycemic episodes, reviewing previous labs and insulin doses and developing a plan to avoid hypo- and hyper-glycemia.   Component     Latest Ref Rng & Units 11/04/2016  Hemoglobin A1C      9.0  C-Peptide     0.80 - 3.85 ng/mL <0.10 (L)  Glucose, Fasting     65 - 99 mg/dL 40 (L)  Glutamic Acid Decarb Ab     <5 IU/mL >250 (H)  Pancreatic Islet Cell Antibody     <5 JDF Units <5   Glucose very low at the time of the blood draw, therefore, C-peptide is not interpretable. However, GAD antibodies are undetectably high, confirming type 1 diabetes.  Philemon Kingdom, MD PhD [facility] Endocrinology

## 2016-11-05 LAB — C-PEPTIDE: C-Peptide: <0.1 ng/mL — ABNORMAL LOW (ref 0.80–3.85)

## 2016-11-05 NOTE — Telephone Encounter (Signed)
LVTCB

## 2016-11-06 ENCOUNTER — Ambulatory Visit: Payer: BLUE CROSS/BLUE SHIELD | Admitting: Family Medicine

## 2016-11-06 LAB — GLUCOSE, FASTING: Glucose, Fasting: 40 mg/dL — ABNORMAL LOW (ref 65–99)

## 2016-11-06 LAB — GLUTAMIC ACID DECARBOXYLASE AUTO ABS

## 2016-11-06 NOTE — Telephone Encounter (Signed)
Secondary voicemail left.

## 2016-11-08 ENCOUNTER — Other Ambulatory Visit: Payer: Self-pay | Admitting: Family Medicine

## 2016-11-10 ENCOUNTER — Ambulatory Visit (INDEPENDENT_AMBULATORY_CARE_PROVIDER_SITE_OTHER): Payer: BLUE CROSS/BLUE SHIELD | Admitting: Podiatry

## 2016-11-10 ENCOUNTER — Encounter: Payer: Self-pay | Admitting: Podiatry

## 2016-11-10 VITALS — BP 132/86 | HR 108 | Resp 14 | Ht 63.5 in | Wt 119.0 lb

## 2016-11-10 DIAGNOSIS — L608 Other nail disorders: Secondary | ICD-10-CM

## 2016-11-10 DIAGNOSIS — B351 Tinea unguium: Secondary | ICD-10-CM | POA: Diagnosis not present

## 2016-11-10 DIAGNOSIS — M79676 Pain in unspecified toe(s): Secondary | ICD-10-CM

## 2016-11-10 NOTE — Progress Notes (Signed)
   Subjective:    Patient ID: Mary Haney, female    DOB: 12-13-1970, 45 y.o.   MRN: [id number]  HPIThis patient presents to the office saying she has pain in her right big toenail  She says the toenail become painful after 4 hours of work.  She is diabetic on insulin.  She presents for evaluation of her right big toenail.    Review of Systems  Hematological: Bruises/bleeds easily.       Slow to heal       Objective:   Physical Exam GENERAL APPEARANCE: Alert, conversant. Appropriately groomed. No acute distress.  VASCULAR: Pedal pulses are  palpable at  [facility] and PT bilateral.  Capillary refill time is immediate to all digits,  Normal temperature gradient.  Digital hair growth is present bilateral  NEUROLOGIC: sensation is normal to 5.07 monofilament at 5/5 sites bilateral.  Light touch is intact bilateral, Muscle strength normal.  MUSCULOSKELETAL: acceptable muscle strength, tone and stability bilateral.  Intrinsic muscluature intact bilateral.  Rectus appearance of foot and digits noted bilateral.   DERMATOLOGIC: skin color, texture, and turgor are within normal limits.  No preulcerative lesions or ulcers  are seen, no interdigital maceration noted.  No open lesions present.  . No drainage noted. Birth mark left hallux.  NAILS  Pincer nail right hallux.         Assessment & Plan:  Pincer nail right hallux  IE  Debride nail.  Discussed conservative vs. Surgical correction.  RTC prn   Gardiner Barefoot DPM

## 2016-11-10 NOTE — Telephone Encounter (Signed)
Pt was advised to see endocrinology. Please advise refill?

## 2016-11-10 NOTE — Progress Notes (Signed)
   Subjective:    Patient ID: Mary Haney, female    DOB: 24-Mar-1971, 45 y.o.   MRN: [id number]  HPI    Review of Systems     Objective:   Physical Exam        Assessment & Plan:

## 2016-11-11 LAB — ANTI-ISLET CELL ANTIBODY

## 2016-11-12 ENCOUNTER — Encounter: Payer: BLUE CROSS/BLUE SHIELD | Attending: Internal Medicine | Admitting: Nutrition

## 2016-11-12 DIAGNOSIS — IMO0001 Reserved for inherently not codable concepts without codable children: Secondary | ICD-10-CM

## 2016-11-12 DIAGNOSIS — Z794 Long term (current) use of insulin: Secondary | ICD-10-CM | POA: Insufficient documentation

## 2016-11-12 DIAGNOSIS — E1165 Type 2 diabetes mellitus with hyperglycemia: Secondary | ICD-10-CM | POA: Insufficient documentation

## 2016-11-12 NOTE — Progress Notes (Signed)
We discussed pump therapy, how they work, and what is needed to be on an insulin pump.  She agreed to the need to test 4X/day, and to learn carb counting.  She was shown the 3 different pumps, and given brochures to review.  We discussed the advantages of each model.  She will review this an decide which pump she would like. She was told to fill out the application in the back of each brochure, and fax in.  She agreed to do this, and had no final questions.  She reports that blood sugars a better, since make the changes to her insulin dose last week.  Denies low blood sugars.  FBS today was 126.

## 2016-11-12 NOTE — Patient Instructions (Signed)
Read over brochures given on the different pumps, and go on line, or call representative for more information. Once you decide on what pump you want, you can fill out paperwork and fax in. When you receive your pump, call for an appt. To start on the pump.

## 2016-11-13 ENCOUNTER — Encounter: Payer: Self-pay | Admitting: Dietician

## 2016-11-13 ENCOUNTER — Encounter: Payer: BLUE CROSS/BLUE SHIELD | Admitting: Dietician

## 2016-11-13 DIAGNOSIS — IMO0002 Reserved for concepts with insufficient information to code with codable children: Secondary | ICD-10-CM

## 2016-11-13 DIAGNOSIS — Z794 Long term (current) use of insulin: Secondary | ICD-10-CM | POA: Diagnosis present

## 2016-11-13 DIAGNOSIS — E1165 Type 2 diabetes mellitus with hyperglycemia: Principal | ICD-10-CM

## 2016-11-13 DIAGNOSIS — E118 Type 2 diabetes mellitus with unspecified complications: Secondary | ICD-10-CM

## 2016-11-13 NOTE — Progress Notes (Signed)
  Medical Nutrition Therapy:  Appt start time: [id number] end time:  1430.  Patient was late for her appointment.   Assessment:  Primary concerns today: Patient is here alone.  She would like learn carbohydrate counting as she is planning on getting an insulin pump. She has not had education on this in the past.  Hx includes type 2 diabetes diagnosed 07/2012 with CBG of >600, asthma, and ETOH pancreatitis.  Labs include A1C of 9.0% 11.28.17 decreased from 10.8% 08/07/16.  Her C-peptide was < 10.  She smokes and states that she just started the Chantix.  Weight hx: 90 lbs when first diagnosed with with diabetes 2013 lowest weight 117 lbs this week which is fairly stable and currently feels too small Highest weight 130 lbs.  Patient lives with her boyfriend.  They share shopping and cooking.  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  Preferred Learning Style:   No preference indicated   Learning Readiness:   Ready  MEDICATIONS: see list to include Lantus 18 units every am, Novolog 3 units plus 1 unit for each 50 points over 120 (SMBG) plus 1-2 units before a snack of 15-30 grams carbohydrates, 4 units before a small meal and 5-6 units before a large meal.   DIETARY INTAKE:  Usual eating pattern includes 3 meals and 1-3 snacks per day.  24-hr recall:  B (7:20-7:45 AM): instant walnut/date oatmeal with milk and butter and fruit and occasional boiled egg OR grits, eggs, liver much, cinnamon raisin toast  Snk (10-10:30 AM): protein shake or bar on mornings that she has oatmeal L (2-3 PM): everything bagel with salad OR soup and salad OR chili and cornbread Snk ( PM): none or peanut butter NABS D (7-8:45 PM): Salmon and salad, roll OR Steak, baked potato, broccoli OR salisbury steak, potatoes or rice, salad, green beans or broccoli Snk ( PM): occasional Sugar free ice cream Beverages: water, flavored water, 2 beer per week, 2 diet soda per week, Ensure  once daily, Ensure or OJ when her blood sugar is low.  Usual physical activity: [employer] but has not used since this summer.  Estimated energy needs: 1600 calories 180 g carbohydrates 100 g protein 53 g fat  Progress Towards Goal(s):  In progress.   Nutritional Diagnosis:  NB-1.1 Food and nutrition-related knowledge deficit As related to carbohydrate counting.  As evidenced by patient report.    Intervention:  Nutrition education regarding carbohydrate counting.  Discussed carbohydrate counting by portion size as well as label reading.  Discussed the concept of an insulin:carbohydrate ratio and how to calculate this.  Discussed the effect of exercise on her blood sugar.  Reviewed treatment for hypoglycemia.  Continue practicing carbohydrate counting. Continue blood sugar checks as discussed with MD. If you add exercise you may need to eat prior to this and may not give insulin depending on your blood sugar level prior to exercise.  Teaching Method Utilized:  Visual Auditory Hands on  Handouts given during visit include:  Meal plan card  Carbohydrate Counting   Label reading  A1C sheet  Barriers to learning/adherence to lifestyle change: none  Demonstrated degree of understanding via:  Teach Back   Monitoring/Evaluation:  Dietary intake, exercise, label reading, and body weight prn.

## 2016-11-14 NOTE — Patient Instructions (Signed)
Continue practicing carbohydrate counting. Continue blood sugar checks as discussed with MD. If you add exercise you may need to eat prior to this and may not give insulin depending on your blood sugar level prior to exercise.

## 2016-11-27 ENCOUNTER — Encounter: Payer: BLUE CROSS/BLUE SHIELD | Admitting: Dietician

## 2016-11-28 ENCOUNTER — Ambulatory Visit: Payer: BLUE CROSS/BLUE SHIELD | Admitting: Family Medicine

## 2016-12-05 ENCOUNTER — Other Ambulatory Visit: Payer: Self-pay | Admitting: Family Medicine

## 2016-12-10 ENCOUNTER — Ambulatory Visit (INDEPENDENT_AMBULATORY_CARE_PROVIDER_SITE_OTHER): Payer: BLUE CROSS/BLUE SHIELD | Admitting: Family Medicine

## 2016-12-10 ENCOUNTER — Encounter: Payer: Self-pay | Admitting: Family Medicine

## 2016-12-10 DIAGNOSIS — F17210 Nicotine dependence, cigarettes, uncomplicated: Secondary | ICD-10-CM | POA: Diagnosis not present

## 2016-12-10 DIAGNOSIS — E1065 Type 1 diabetes mellitus with hyperglycemia: Secondary | ICD-10-CM | POA: Diagnosis not present

## 2016-12-10 MED ORDER — "INSULIN SYRINGE 30G X 5/16"" 0.5 ML MISC": 11 refills | Status: DC

## 2016-12-10 NOTE — Patient Instructions (Signed)
Go ahead and start the chantix.  Insulin per Dr. Renne Crigler.  Follow up in 6 months.  Take care   Dr. Lacinda Axon

## 2016-12-10 NOTE — Progress Notes (Signed)
Pre visit review using our clinic review tool, if applicable. No additional management support is needed unless otherwise documented below in the visit note. 

## 2016-12-10 NOTE — Assessment & Plan Note (Signed)
Uncontrolled (patient found to have low c peptide; consistent with type 1 DM). Encouraged compliance with insulin regimen. Advised close follow up with Endo.

## 2016-12-10 NOTE — Assessment & Plan Note (Signed)
Still smoking.  Encouraged cessation. Encouraged to start Chantix.

## 2016-12-10 NOTE — Progress Notes (Signed)
Subjective:  Patient ID: Mary Haney, female    DOB: July 17, 1971  Age: 46 y.o. MRN: [id number]  CC: Follow up  HPI:  46 year old female with uncontrolled diabetes and tobacco abuse presents for follow up.  DM  Sugars remain high.  Has seen Endo.  Has had several low sugars in the am and then holds lantus.  Endorses compliance with Novolog.  Needs eye exam.  Tobacco abuse  Has yet to pick up the Chantix.  Still smoking 1 ppd.  No ready to quit yet.  Social Hx   Social History   Social History  . Marital status: Single    Spouse name: N/A  . Number of children: 0  . 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 Smoking status: Current Every Day Smoker    Packs/day: 0.50    Years: 25.00    Types: Cigarettes  . Smokeless tobacco: Never Used     Comment: Has Chantix  . Alcohol use 1.8 oz/week    3 Cans of beer per week  . Drug use: No  . Sexual activity: Yes    Birth control/ protection: Condom   Other Topics Concern  . None   Social History Narrative   Wayne was born in [place], [place]. She moved with her family to [place] at age 84. She attended 2 years at [employer] and was majoring in [place]. Her goal is to become a Optometrist in the [employer]. 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 Ande lives with her boyfriend, [name]. They are considering getting married soon. She enjoys sketching outdoor scenery, cartoons and loves to read.   Review of Systems  Constitutional: Negative.   Endocrine:       Hypoglycemia.   Objective:  BP 101/66   Pulse (!) 104   Temp 98.4 F (36.9 C) (Oral)   Resp 12   Wt 115 lb 12.8 oz (52.5 kg)   SpO2 100%   BMI 20.51 kg/m   BP/Weight 12/10/2016 11/13/2016 [id number]  Systolic BP 99991111 - [id number]  Diastolic BP 66 - 86  Wt. (Lbs) 115.8 117 119  BMI 20.51 20.73 20.75   Physical Exam    Constitutional: She is oriented to person, place, and time. She appears well-developed. No distress.  Cardiovascular: Normal rate and regular rhythm.   Pulmonary/Chest: Effort normal and breath sounds normal.  Neurological: She is alert and oriented to person, place, and time.  Psychiatric: She has a normal mood and affect.  Vitals reviewed.   Lab Results  Component Value Date   WBC 10.7 04/30/2016   HGB 11.7 (L) 04/30/2016   HCT 33.9 (L) 04/30/2016   PLT 283 04/30/2016   GLUCOSE 164 (H) 05/07/2016   CHOL 145 05/07/2016   TRIG 131.0 05/07/2016   HDL 67.60 05/07/2016   LDLCALC 51 05/07/2016   ALT 11 05/07/2016   AST 16 05/07/2016   NA 133 (L) 05/07/2016   K 4.1 05/07/2016   CL 98 05/07/2016   CREATININE 0.61 05/07/2016   BUN 8 05/07/2016   CO2 29 05/07/2016   TSH 1.05 06/12/2012   HGBA1C 9.0 11/04/2016   MICROALBUR <0.7 05/07/2016    Assessment & Plan:   Problem List Items Addressed This Visit    Uncontrolled type 1 diabetes mellitus with hyperglycemia, with long-term current use of insulin ([place])  Uncontrolled (patient found to have low c peptide; consistent with type 1 DM). Encouraged compliance with insulin regimen. Advised close follow up with Endo.      Nicotine dependence    Still smoking.  Encouraged cessation. Encouraged to start Chantix.        Meds ordered this encounter  Medications  . Insulin Syringe-Needle U-100 (INSULIN SYRINGE .5CC/30GX5/16") 30G X 5/16" 0.5 ML MISC    Sig: Use as directed when taking insulin.    Dispense:  100 each    Refill:  11    Follow-up: 6 months  [place]

## 2016-12-12 ENCOUNTER — Telehealth: Payer: Self-pay | Admitting: Nutrition

## 2016-12-12 NOTE — Telephone Encounter (Signed)
Patient ask you to give her a call °

## 2016-12-22 NOTE — Telephone Encounter (Signed)
Aid she has not heard

## 2016-12-23 ENCOUNTER — Ambulatory Visit: Payer: BLUE CROSS/BLUE SHIELD | Admitting: Internal Medicine

## 2016-12-23 ENCOUNTER — Telehealth: Payer: Self-pay | Admitting: Internal Medicine

## 2016-12-23 NOTE — Telephone Encounter (Signed)
1.5 mo 

## 2016-12-23 NOTE — Telephone Encounter (Signed)
Patient no showed today's appt. Please advise on how to follow up. °A. No follow up necessary. °B. Follow up urgent. Contact patient immediately. °C. Follow up necessary. Contact patient and schedule visit in ___ days. °D. Follow up advised. Contact patient and schedule visit in ____weeks. ° °

## 2017-01-02 ENCOUNTER — Ambulatory Visit (INDEPENDENT_AMBULATORY_CARE_PROVIDER_SITE_OTHER): Payer: BLUE CROSS/BLUE SHIELD | Admitting: Internal Medicine

## 2017-01-02 ENCOUNTER — Encounter: Payer: Self-pay | Admitting: Internal Medicine

## 2017-01-02 VITALS — BP 132/80 | HR 99 | Ht 63.5 in | Wt 118.0 lb

## 2017-01-02 DIAGNOSIS — E1065 Type 1 diabetes mellitus with hyperglycemia: Secondary | ICD-10-CM

## 2017-01-02 MED ORDER — INSULIN GLARGINE 100 UNIT/ML ~~LOC~~ SOLN: SUBCUTANEOUS | 1 refills | Status: DC

## 2017-01-02 NOTE — Progress Notes (Signed)
Patient ID: Mary Haney, female   DOB: 11-22-1971, 46 y.o.   MRN: [id number]  HPI: Mary Haney is a 46 y.o.-year-old female, returning for f/u for DM, dx 2013, insulin-dependent, uncontrolled, without complications. Last visit 2 mo ago.  Reviewed hx: In 07/2012 she went to the ED because she was not feeling well. She was found to have a blood glucose level of 600 + and was admitted with DKA. She was followed by [name] immediately following initial diagnosis, then switched to [facility] endocrinology.   In 10/2013, due to lack of finances, she was stretching the insulin doses >> sugars high >> low CBG 25-26 >> ED.    She was admitted in 04/2016 for DKA.  Since last visit, she met the DM educator and nutritionist >> decided for Omnipod pump >> but nobody called her back >> this year cannot afford it.  She started exercise: walking and arm strength >> 1h a day.  Reviewed HbA1c levels: Lab Results  Component Value Date   HGBA1C 9.0 11/04/2016   HGBA1C 10.8 (H) 08/07/2016   HGBA1C 10.3 (H) 05/07/2016   Pt is on a regimen of: - Lantus 18 units in hs >> am - forgot few doses - Novolog as follows: - 1-2 units before a snack (15-30g carbs) - 4 units before a smaller meal - 5-6 units before a larger meal Continue Novolog SSI: target 120, ISF 50.  Pt checks her sugars 2-3x a day and they are better: - am: 90-140  (~1-2x a week: 200-300 - unexplained) >> 60s, 189-217 (3 units) >> 40-106, 443 >> 171-220 - 2h after b'fast: n/c (1 unit- snack) >> 41-401 >> n/c - before lunch: 123-140 >> n/c (busy) (3-4 units) >> 58-395 >> 80-120 - 2h after lunch: n/c >> 378, 395 >> n/c - before dinner: 80-100 >> 89-118 (4-5 units) >> 357-HI (snack in the pm not covered with insulin!) >> 130s - 2h after dinner: n/c  (1 unit - snack) >> n/c >> 32 x1, 179, 415 >> n/c - bedtime: n/c >> 130s >> n/c - nighttime: n/c Lowest sugar was 38 x1 >> 32 after dinner x1, 40 in am >> 3 am: 51; she has hypoglycemia  awareness at 60.  Highest sugar was 300 >> 400s >> HI >> 220.  Has ReliOn meter.  Pt's meals are: - Breakfast: oatmeal, yoghurt, sausage bisquit - Lunch: salad, soup, sandwiches - Dinner: meat + starch + vegetables - Snacks: yoghurt, peanut crackers, nuts  - no CKD Lab Results  Component Value Date   BUN 8 05/07/2016   Lab Results  Component Value Date   CREATININE 0.61 05/07/2016   - no HL Lab Results  Component Value Date   CHOL 145 05/07/2016   HDL 67.60 05/07/2016   LDLCALC 51 05/07/2016   TRIG 131.0 05/07/2016   CHOLHDL 2 05/07/2016   - last eye exam was in 11/2015. No DR. Scheduled for 01/2017. - no numbness and tingling in her feet. Foot exam normal, checked by PCP 07/2016.  ROS: Constitutional:no weight /loss, no fatigue, no subjective hyperthermia/hypothermia, + nocturia Eyes: no blurry vision, no xerophthalmia ENT: no sore throat, no nodules palpated in throat, no dysphagia/odynophagia, no hoarseness Cardiovascular: no CP/SOB/palpitations/leg swelling Respiratory: no cough/no SOB Gastrointestinal: no N/V/no D/C Musculoskeletal: no muscle/joint aches Skin: no rash  Neurological: no tremors/numbness/tingling/dizziness  I reviewed pt's medications, allergies, PMH, social hx, family hx, and changes were documented in the history of present illness. Otherwise, unchanged from my initial visit note.  Past Medical History:  Diagnosis Date  . Alcoholic pancreatitis   . Asthma    Has albuterol inhaler  . Diabetes mellitus without complication ([place])    Type 2 diagnosed 2013   Past Surgical History:  Procedure Laterality Date  . NO PAST SURGERIES     History   Social History  . Marital Status: Single    Spouse Name: N/A    Number of Children: 0  . Years of Education: 14   Occupational History  . 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 Smoking status: Current Every Day Smoker -- 0.50 packs/day for 25 years    Types:  Cigarettes  . Smokeless tobacco: Never Used  . Alcohol Use: 1.8 oz/week    3 Cans of beer per week  . Drug Use: No  . Sexual Activity: Yes    Birth Control/ Protection: Condom   Social History Narrative   Cary was born in [place], [place]. She moved with her family to [place] at age 30. She attended 2 years at [employer] and was majoring in [place]. Her goal is to become a Optometrist in the [employer]. 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 Codi lives with her boyfriend, [name]. They are considering getting married soon. She enjoys sketching outdoor scenery, cartoons and loves to read.   Current Outpatient Prescriptions on File Prior to Visit  Medication Sig Dispense Refill  . albuterol (PROAIR HFA) 108 (90 Base) MCG/ACT inhaler Inhale 2 puffs into the lungs every 6 (six) hours as needed for wheezing or shortness of breath. 1 Inhaler 1  . cetirizine (ZYRTEC) 10 MG tablet Take 10 mg by mouth daily as needed for allergies.     . Insulin Pen Needle 31G X 5 MM MISC Use when taking insulin. 100 each 11  . Insulin Syringe-Needle U-100 (INSULIN SYRINGE .5CC/30GX5/16") 30G X 5/16" 0.5 ML MISC Use as directed when taking insulin. 100 each 11  . LANTUS 100 UNIT/ML injection INJECT 20 UNITS SUBCUTANEOUSLY AT BEDTIME (Patient taking differently: INJECT 20 UNITS SUBCUTANEOUSLY AT MORNING) 10 mL 1  . NOVOLOG FLEXPEN 100 UNIT/ML FlexPen INJECT 3 UNITS INTO THE SKIN 3 (THREE) TIMES DAILY WITH MEALS. PER SLIDING SCALE 15 mL 1  . varenicline (CHANTIX CONTINUING MONTH PAK) 1 MG tablet Take 1 tablet (1 mg total) by mouth 2 (two) times daily. 120 tablet 0  . varenicline (CHANTIX STARTING MONTH PAK) 0.5 MG X 11 & 1 MG X 42 tablet Take one 0.5 mg tablet by mouth once daily for 3 days, then increase to one 0.5 mg tablet twice daily for 4 days, then increase to one 1 mg tablet twice daily. 53 tablet 0   No current facility-administered medications on file prior to visit.     No Known Allergies Family History  Problem Relation Age of Onset  . Diabetes Father   . Depression Paternal Grandmother   . Depression Paternal Grandfather   . Depression Cousin    PE: BP 132/80   Pulse 99   Ht 5' 3.5" (1.613 m)   Wt 118 lb (53.5 kg)   SpO2 97%   BMI 20.57 kg/m  Wt Readings from Last 3 Encounters:  01/02/17 118 lb (53.5 kg)  12/10/16 115 lb 12.8 oz (52.5 kg)  11/13/16 117 lb (53.1 kg)   Constitutional: normal weight, in NAD Eyes: PERRLA, EOMI, no exophthalmos ENT: moist mucous membranes, no thyromegaly,  no cervical lymphadenopathy Cardiovascular: tachycardia, RR, No MRG Respiratory: CTA B Gastrointestinal: abdomen soft, NT, ND, BS+ Musculoskeletal: no deformities, strength intact in all 4 Skin: moist, warm, no rashes Neurological: no tremor with outstretched hands, DTR normal in all 4  ASSESSMENT: 1. DM, insulin-dependent, uncontrolled, with complications - hyper- and hypo-glycemia - DKA  Component     Latest Ref Rng & Units 11/04/2016  Hemoglobin A1C      9.0  C-Peptide     0.80 - 3.85 ng/mL <0.10 (L)  Glucose, Fasting     65 - 99 mg/dL 40 (L)  Glutamic Acid Decarb Ab     <5 IU/mL >250 (H)  Pancreatic Islet Cell Antibody     <5 JDF Units <5   Glucose very low at the time of the blood draw, therefore, C-peptide is not interpretable. However, GAD antibodies are undetectably high, confirming type 1 diabetes.  PLAN:  1. Patient with DM1, on basal-bolus insulin regimen, with improved control after moving Lantus in a.m. Sugars during the day and now close to goal (if she does not forget Lantus in a.m., which can happen per her report), however, they are higher in a.m. We discussed about either switching to [place] or splitting the Lantus. She still has Lantus at home and we are not sure whether Tyler Aas is covered by her insurance. We decided to split the Lantus dose to 10 units in a.m. and 8 units at bedtime. - At last visit, we discussed about an  insulin pump and she was eager to start 1 before the end of the year, since she already met her deductible for last year. She had carb counting and prepump training appointments already in preparation for getting a insulin pump. Despite multiple calls to [employer], she did not get any answers back and she is not sure whether she can obtain the pump this year, with an meeting her deductible for the year... - Last HbA1c was better: 9.0% -  I suggested to:  Patient Instructions  Please split: - Lantus 10 units in am and 8 units at night  Continue: - Novolog mealtime as follows: - 1-2 units before a snack (15-30g carbs) - 4 units before a smaller meal - 5-6 units before a larger meal - Novolog SSI: target 120, ISF 50.  Please return in 3 months with your sugar log.    - Strongly advised her to start checking sugars at different times of the day - check 3 times a day, rotating checks - advised for yearly eye exams >> has one coming up - Return to clinic in 3 mo with sugar log   Philemon Kingdom, MD PhD [facility] Endocrinology

## 2017-01-02 NOTE — Patient Instructions (Addendum)
Please split: - Lantus 10 units in am and 8 units at night  Continue: - Novolog mealtime as follows: - 1-2 units before a snack (15-30g carbs) - 4 units before a smaller meal - 5-6 units before a larger meal - Novolog SSI: target 120, ISF 50.  Please return in 3 months with your sugar log.

## 2017-01-15 ENCOUNTER — Other Ambulatory Visit: Payer: Self-pay | Admitting: Family Medicine

## 2017-01-15 ENCOUNTER — Telehealth: Payer: Self-pay | Admitting: Family Medicine

## 2017-01-15 MED ORDER — TRIAMCINOLONE ACETONIDE 0.1 % EX OINT: 1.0000 "application " | TOPICAL_OINTMENT | Freq: Two times a day (BID) | CUTANEOUS | 3 refills | Status: DC

## 2017-01-15 NOTE — Telephone Encounter (Signed)
Pt called needing a refill for Triamcilone .1% ointment pt states she needs a bigger one. Please advise?  Pharmacy is [id number] - [place], [place]  Call pt @ [phone]. Thank you!

## 2017-01-15 NOTE — Telephone Encounter (Signed)
Sent!

## 2017-01-15 NOTE — Telephone Encounter (Signed)
This is no longer on her list.

## 2017-04-02 ENCOUNTER — Ambulatory Visit (INDEPENDENT_AMBULATORY_CARE_PROVIDER_SITE_OTHER): Payer: BLUE CROSS/BLUE SHIELD | Admitting: Internal Medicine

## 2017-04-02 ENCOUNTER — Encounter: Payer: Self-pay | Admitting: Internal Medicine

## 2017-04-02 VITALS — BP 118/62 | HR 98 | Temp 98.2°F | Wt 115.6 lb

## 2017-04-02 DIAGNOSIS — E1065 Type 1 diabetes mellitus with hyperglycemia: Secondary | ICD-10-CM

## 2017-04-02 LAB — POCT GLYCOSYLATED HEMOGLOBIN (HGB A1C): Hemoglobin A1C: 8.1

## 2017-04-02 NOTE — Progress Notes (Signed)
Patient ID: Mary Haney, female   DOB: [birth year], 46 y.o.   MRN: [id number]  HPI: Mary Haney is a 46 y.o.-year-old female, returning for f/u for DM, dx 2013, insulin-dependent, uncontrolled, without complications. Last visit 3 mo ago.  Reviewed hx: In 07/2012 she went to the ED because she was not feeling well. She was found to have a blood glucose level of 600 + and was admitted with DKA. She was followed by [name] immediately following initial diagnosis, then switched to [facility] endocrinology.   In 10/2013, due to lack of finances, she was stretching the insulin doses >> sugars high >> low CBG 25-26 >> ED.    She was admitted in 04/2016 for DKA.  At the end of 2017, she met the DM educator and nutritionist >> decided for Omnipod pump >> but nobody called her back >> this year cannot afford it.  At this visit, she tells me that she decided against a pump for now.  She is very active at work and tells me that she does sometimes forget to take her insulin.  Reviewed HbA1c levels: Lab Results  Component Value Date   HGBA1C 9.0 11/04/2016   HGBA1C 10.8 (H) 08/07/2016   HGBA1C 10.3 (H) 05/07/2016   Pt is on a regimen of: - Lantus 18 units in hs >> am >> 10 units in am and 5 in the HS (forgets second dose 5/7 days) - We split Lantus dose in 12/2016 - Novolog as follows: - 1-2 units before a snack (15-30g carbs) - 4 units before a smaller meal - 5-6 units before a larger meal Continue Novolog SSI: target 120, ISF 50.  Pt checks her sugars 2-3x a day and they are better: - am: 60s, 189-217 (3 units) >> 40-106, 443 >> 171-220 >> 110-170, but 200-250 if forgetting Lantus - 2h after b'fast: n/c (1 unit- snack) >> 41-401 >> n/c - before lunch: 123-140 >> n/c (busy) (3-4 units) >> 58-395 >> 80-120 >> 80-150, 270 - 2h after lunch: n/c >> 378, 395 >> n/c - before dinner: 89-118 (4-5 units) >> 357-HI (snack in the pm not covered with insulin!) >> 130s >> 130s - 2h after dinner: n/c   (1 unit - snack) >> n/c >> 32 x1, 179, 415 >> n/c - bedtime: n/c >> 130s >> n/c >> 120-170 - nighttime: n/c Lowest sugar was 38 x1 >> 32 after dinner x1, 40 in am >> 3 am: 51; she has hypoglycemia awareness at 60.  Highest sugar was 300 >> 400s >> HI >> 220.  Has ReliOn meter.  Pt's meals are: - Breakfast: oatmeal, yoghurt, sausage bisquit - Lunch: salad, soup, sandwiches - Dinner: meat + starch + vegetables - Snacks: yoghurt, peanut crackers, nuts  - no CKD Lab Results  Component Value Date   BUN 8 05/07/2016   Lab Results  Component Value Date   CREATININE 0.61 05/07/2016   - no HL Lab Results  Component Value Date   CHOL 145 05/07/2016   HDL 67.60 05/07/2016   LDLCALC 51 05/07/2016   TRIG 131.0 05/07/2016   CHOLHDL 2 05/07/2016   - last eye exam was in 11/2015. No DR. This was scheduled  01/2017 >> she will schedule.. - no numbness and tingling in her feetFoot exam normal, check by PCP in 07/2016.   ROS: Constitutional: no weight gain/no weight loss, no fatigue, no subjective hyperthermia, no subjective hypothermia Eyes: no blurry vision, no xerophthalmia ENT: no sore throat, no nodules palpated in  throat, no dysphagia, no odynophagia, no hoarseness Cardiovascular: no CP/no SOB/no palpitations/no leg swelling Respiratory: no cough/no SOB/no wheezing Gastrointestinal: no N/no V/no D/no C/no acid reflux Musculoskeletal: no muscle aches/no joint aches Skin: no rashes, no hair loss Neurological: no tremors/no numbness/no tingling/no dizziness  I reviewed pt's medications, allergies, PMH, social hx, family hx, and changes were documented in the history of present illness. Otherwise, unchanged from my initial visit note.  Past Medical History:  Diagnosis Date  . Alcoholic pancreatitis   . Asthma    Has albuterol inhaler  . Diabetes mellitus without complication ([place])    Type 2 diagnosed 2013   Past Surgical History:  Procedure Laterality Date  . NO PAST  SURGERIES     History   Social History  . Marital Status: Single    Spouse Name: N/A    Number of Children: 0  . Years of Education: 14   Occupational History  . Take Out Specialist     [employer]   Social History Main Topics  . Smoking status: Current Every Day Smoker -- 0.50 packs/day for 25 years    Types: Cigarettes  . Smokeless tobacco: Never Used  . Alcohol Use: 1.8 oz/week    3 Cans of beer per week  . Drug Use: No  . Sexual Activity: Yes    Birth Control/ Protection: Condom   Social History Narrative   Mary Haney was born in [place], [place]. She moved with her family to [place] at age 78. She attended 2 years at [employer] and was majoring in [place]. Her goal is to become a Optometrist in the [employer]. 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 Mary Haney lives with her boyfriend, Mary Haney. They are considering getting married soon. She enjoys sketching outdoor scenery, cartoons and loves to read.   Current Outpatient Prescriptions on File Prior to Visit  Medication Sig Dispense Refill  . albuterol (PROAIR HFA) 108 (90 Base) MCG/ACT inhaler Inhale 2 puffs into the lungs every 6 (six) hours as needed for wheezing or shortness of breath. 1 Inhaler 1  . cetirizine (ZYRTEC) 10 MG tablet Take 10 mg by mouth daily as needed for allergies.     Marland Kitchen insulin glargine (LANTUS) 100 UNIT/ML injection INJECT 10 UNITS SUBCUTANEOUSLY in am and 8 units at bedtime 10 mL 1  . Insulin Pen Needle 31G X 5 MM MISC Use when taking insulin. 100 each 11  . Insulin Syringe-Needle U-100 (INSULIN SYRINGE .5CC/30GX5/16") 30G X 5/16" 0.5 ML MISC Use as directed when taking insulin. 100 each 11  . NOVOLOG FLEXPEN 100 UNIT/ML FlexPen INJECT 3 UNITS INTO THE SKIN 3 (THREE) TIMES DAILY WITH MEALS. PER SLIDING SCALE 15 mL 1  . triamcinolone ointment (KENALOG) 0.1 % Apply 1 application topically 2 (two) times daily. 80 g 3  . varenicline (CHANTIX CONTINUING MONTH PAK) 1 MG tablet  Take 1 tablet (1 mg total) by mouth 2 (two) times daily. 120 tablet 0  . varenicline (CHANTIX STARTING MONTH PAK) 0.5 MG X 11 & 1 MG X 42 tablet Take one 0.5 mg tablet by mouth once daily for 3 days, then increase to one 0.5 mg tablet twice daily for 4 days, then increase to one 1 mg tablet twice daily. 53 tablet 0   No current facility-administered medications on file prior to visit.    No Known Allergies Family History  Problem Relation Age of Onset  . Diabetes Father   . Depression Paternal  Grandmother   . Depression Paternal Grandfather   . Depression Cousin    PE: BP 118/62 (BP Location: Left Arm, Patient Position: Sitting, Cuff Size: Normal)   Pulse 98   Temp 98.2 F (36.8 C) (Oral)   Wt 115 lb 9.6 oz (52.4 kg)   SpO2 96%   BMI 20.16 kg/m  Wt Readings from Last 3 Encounters:  04/02/17 115 lb 9.6 oz (52.4 kg)  01/02/17 118 lb (53.5 kg)  12/10/16 115 lb 12.8 oz (52.5 kg)   Constitutional: normal weight, in NAD Eyes: PERRLA, EOMI, no exophthalmos ENT: moist mucous membranes, no thyromegaly, no cervical lymphadenopathy Cardiovascular: RRR, No MRG Respiratory: CTA B Gastrointestinal: abdomen soft, NT, ND, BS+ Musculoskeletal: no deformities, strength intact in all 4 Skin: moist, warm, no rashes Neurological: no tremor with outstretched hands, DTR normal in all 4  ASSESSMENT: 1. DM, insulin-dependent, uncontrolled, with complications - hyper- and hypo-glycemia - DKA  Component     Latest Ref Rng & Units 11/04/2016  Hemoglobin A1C      9.0  C-Peptide     0.80 - 3.85 ng/mL <0.10 (L)  Glucose, Fasting     65 - 99 mg/dL 40 (L)  Glutamic Acid Decarb Ab     <5 IU/mL >250 (H)  Pancreatic Islet Cell Antibody     <5 JDF Units <5   Glucose very low at the time of the blood draw, therefore, C-peptide is not interpretable. However, GAD antibodies are undetectably high, confirming type 1 diabetes.  PLAN:  1. Patient with DM1, on basal-bolus insulin regimen, with improved  control after moving Lantus in a.m., but with improved sugars after splitting the Lantus. Unfortunately, she is still forgetting the Lantus dose at night 5 out of 7 days. At today's visit I advised her to move Lantus closer to dinnertime, so she does not fall asleep and forget the medication. She tells me that this would work better for her. - Since she felt that her sugars were dropping too low when she took 8 units of Lantus at night, will continue with 5 units - I advised her to ask her insurance whether FiAsp insulin discovered. 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  Please return in 3 months with your sugar log.   - continue to check sugar 3 times a day, rotating checked times - advised for yearly eye exams >> has one coming up - Return in about 3 months (around 07/02/2017).  Philemon Kingdom, MD PhD [facility] Endocrinology

## 2017-04-02 NOTE — Patient Instructions (Addendum)
Please continue: - Lantus 10 units in am and 5 units at night (move this closer to dinnertime) - Novolog mealtime as follows: - 1-2 units before a snack  - 4 units before a smaller meal - 5-6 units before a larger meal - Novolog SSI: target 120, ISF 50.  Please return in 3 months with your sugar log.

## 2017-06-05 ENCOUNTER — Other Ambulatory Visit: Payer: Self-pay | Admitting: Family Medicine

## 2017-06-09 ENCOUNTER — Ambulatory Visit (INDEPENDENT_AMBULATORY_CARE_PROVIDER_SITE_OTHER): Payer: BLUE CROSS/BLUE SHIELD | Admitting: Family Medicine

## 2017-06-09 ENCOUNTER — Encounter: Payer: Self-pay | Admitting: Family Medicine

## 2017-06-09 DIAGNOSIS — E1065 Type 1 diabetes mellitus with hyperglycemia: Secondary | ICD-10-CM | POA: Diagnosis not present

## 2017-06-09 DIAGNOSIS — F17210 Nicotine dependence, cigarettes, uncomplicated: Secondary | ICD-10-CM | POA: Diagnosis not present

## 2017-06-09 NOTE — Progress Notes (Signed)
Subjective:  Patient ID: Mary Haney, female    DOB: [birth year]  Age: 46 y.o. MRN: [id number]  CC: Follow up  HPI:  46 year old female with Type 1 DM, Asthma, Tobacco abuse presents for follow up.  DM-1  Now seeing endocrinology. Blood sugars improving.  Needs eye exam. Patient states that she will get later this month.  She endorses compliance with her Lantus and Novolog regimen per Endo.  Tobacco abuse/nicotine dependence  Has not started Chantix.   Continues to smoke.  Not ready to quit.   Social Hx   Social History   Social History  . Marital status: Single    Spouse name: N/A  . Number of children: 0  . 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 Smoking status: Current Every Day Smoker    Packs/day: 0.50    Years: 25.00    Types: Cigarettes  . Smokeless tobacco: Never Used     Comment: Has Chantix  . Alcohol use 1.8 oz/week    3 Cans of beer per week  . Drug use: No  . Sexual activity: Yes    Birth control/ protection: Condom   Other Topics Concern  . None   Social History Narrative   Mary Haney was born in [place], [place]. She moved with her family to [place] at age 52. She attended 2 years at [employer] and was majoring in [place]. Her goal is to become a Optometrist in the [employer]. 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 Mary Haney lives with her boyfriend, Mary Haney. They are considering getting married soon. She enjoys sketching outdoor scenery, cartoons and loves to read.    Review of Systems  Constitutional: Negative.   Respiratory: Negative.    Objective:  BP 110/66 (BP Location: Left Arm, Patient Position: Sitting, Cuff Size: Normal)   Pulse (!) 102   Temp 99.3 F (37.4 C) (Oral)   Wt 118 lb 2 oz (53.6 kg)   SpO2 98%   BMI 20.60 kg/m   BP/Weight 06/09/2017 04/02/2017 06/04/3661  Systolic BP 947 654 650    Diastolic BP 66 62 80  Wt. (Lbs) 118.13 115.6 118  BMI 20.6 20.16 20.57    Physical Exam  Constitutional: She is oriented to person, place, and time. She appears well-developed. No distress.  Cardiovascular: Normal rate and regular rhythm.   Pulmonary/Chest: Effort normal and breath sounds normal.  Neurological: She is alert and oriented to person, place, and time.  Psychiatric: She has a normal mood and affect.  Vitals reviewed.   Lab Results  Component Value Date   WBC 10.7 04/30/2016   HGB 11.7 (L) 04/30/2016   HCT 33.9 (L) 04/30/2016   PLT 283 04/30/2016   GLUCOSE 164 (H) 05/07/2016   CHOL 145 05/07/2016   TRIG 131.0 05/07/2016   HDL 67.60 05/07/2016   LDLCALC 51 05/07/2016   ALT 11 05/07/2016   AST 16 05/07/2016   NA 133 (L) 05/07/2016   K 4.1 05/07/2016   CL 98 05/07/2016   CREATININE 0.61 05/07/2016   BUN 8 05/07/2016   CO2 29 05/07/2016   TSH 1.05 06/12/2012   HGBA1C 8.1 04/02/2017   MICROALBUR <0.7 05/07/2016    Assessment & Plan:   Problem List Items Addressed This Visit      Endocrine   Uncontrolled type 1 diabetes mellitus  with hyperglycemia, with long-term current use of insulin (HCC)    Improving. Continue current insulin regimen. Continue to follow with Endo.        Other   Nicotine dependence    No ready to quit. Will continue to encourage.        Follow-up: Return in about 6 months (around 12/10/2017).  [place]

## 2017-06-09 NOTE — Assessment & Plan Note (Signed)
No ready to quit. Will continue to encourage.

## 2017-06-09 NOTE — Patient Instructions (Signed)
Stop smoking.   Continue following with Dr. Cruzita Lederer.  Get your eyes check.  Follow up in 6 months.  Take care  Dr. Lacinda Axon

## 2017-06-09 NOTE — Assessment & Plan Note (Signed)
Improving. Continue current insulin regimen. Continue to follow with Endo.

## 2017-06-15 ENCOUNTER — Other Ambulatory Visit: Payer: Self-pay | Admitting: Family Medicine

## 2017-07-21 ENCOUNTER — Ambulatory Visit: Payer: BLUE CROSS/BLUE SHIELD | Admitting: Internal Medicine

## 2017-08-18 ENCOUNTER — Other Ambulatory Visit (HOSPITAL_COMMUNITY)
Admission: RE | Admit: 2017-08-18 | Discharge: 2017-08-18 | Disposition: A | Discharge status: Home / Self Care | Payer: BLUE CROSS/BLUE SHIELD | Source: Ambulatory Visit | Attending: Family | Admitting: Family

## 2017-08-18 ENCOUNTER — Encounter: Payer: Self-pay | Admitting: Family

## 2017-08-18 ENCOUNTER — Ambulatory Visit (INDEPENDENT_AMBULATORY_CARE_PROVIDER_SITE_OTHER): Payer: BLUE CROSS/BLUE SHIELD | Admitting: Family

## 2017-08-18 VITALS — BP 124/82 | HR 95 | Temp 98.3°F | Ht 63.5 in | Wt 117.6 lb

## 2017-08-18 DIAGNOSIS — K625 Hemorrhage of anus and rectum: Secondary | ICD-10-CM | POA: Diagnosis not present

## 2017-08-18 DIAGNOSIS — N939 Abnormal uterine and vaginal bleeding, unspecified: Secondary | ICD-10-CM | POA: Diagnosis not present

## 2017-08-18 DIAGNOSIS — K648 Other hemorrhoids: Secondary | ICD-10-CM | POA: Insufficient documentation

## 2017-08-18 MED ORDER — HYDROCORTISONE ACETATE 25 MG RE SUPP: 25.0000 mg | Freq: Two times a day (BID) | RECTAL | 0 refills | Status: AC

## 2017-08-18 NOTE — Patient Instructions (Signed)
Stool cards  May try anu sol for suspected hemorrhoid  If bleeding does not improve or periods were to continue to be irregular, I would consult GYN and perform more labs.   Follow up one month.    Dysfunctional Uterine Bleeding Dysfunctional uterine bleeding is abnormal bleeding from the uterus. Dysfunctional uterine bleeding includes:  A period that comes earlier or later than usual.  A period that is lighter, heavier, or has blood clots.  Bleeding between periods.  Skipping one or more periods.  Bleeding after sexual intercourse.  Bleeding after menopause.  Follow these instructions at home: Pay attention to any changes in your symptoms. Follow these instructions to help with your condition: Eating and drinking  Eat well-balanced meals. Include foods that are high in iron, such as liver, meat, shellfish, green leafy vegetables, and eggs.  If you become constipated: ? Drink plenty of water. ? Eat fruits and vegetables that are high in water and fiber, such as spinach, carrots, raspberries, apples, and mango. Medicines  Take over-the-counter and prescription medicines only as told by your health care provider.  Do not change medicines without talking with your health care provider.  Aspirin or medicines that contain aspirin may make the bleeding worse. Do not take those medicines: ? During the week before your period. ? During your period.  If you were prescribed iron pills, take them as told by your health care provider. Iron pills help to replace iron that your body loses because of this condition. Activity  If you need to change your sanitary pad or tampon more than one time every 2 hours: ? Lie in bed with your feet raised (elevated). ? Place a cold pack on your lower abdomen. ? Rest as much as possible until the bleeding stops or slows down.  Do not try to lose weight until the bleeding has stopped and your blood iron level is back to normal. Other  Instructions  For two months, write down: ? When your period starts. ? When your period ends. ? When any abnormal bleeding occurs. ? What problems you notice.  Keep all follow up visits as told by your health care provider. This is important. Contact a health care provider if:  You get light-headed or weak.  You have nausea and vomiting.  You cannot eat or drink without vomiting.  You feel dizzy or have diarrhea while you are taking medicines.  You are taking birth control pills or hormones, and you want to change them or stop taking them. Get help right away if:  You develop a fever or chills.  You need to change your sanitary pad or tampon more than one time per hour.  Your bleeding becomes heavier, or your flow contains clots more often.  You develop pain in your abdomen.  You lose consciousness.  You develop a rash. This information is not intended to replace advice given to you by your health care provider. Make sure you discuss any questions you have with your health care provider. Document Released: 11/21/2000 Document Revised: 05/01/2016 Document Reviewed: 02/19/2015 Elsevier Interactive Patient Education  [employer].

## 2017-08-18 NOTE — Progress Notes (Signed)
Subjective:    Patient ID: Mary Haney, female    DOB: 09-12-1971, 46 y.o.   MRN: [id number]  CC: Mary Haney is a 46 y.o. female who presents today for an acute visit.    HPI: Rectal bleeding , thought from hemorrhoids x one week. No anal itching, no pain with BM. Normal BM. No abdominal pain. BRB on outside of stool and on toilet.    vaginal bleeding x one week.   LMP 2 weeks ago. Normally has regular cycles. Heavy first day and then light. Lasts 3 -5 days.   Endorses light spotting. No changes in exercise. No stress.   Not worried about rectal bleeding as had anal intercourse.   No concern for STDS, pregnancy. Using condoms.   Follows with Dr Mayra Neer for DM I. Sugars have been controlled.    Pap 2016 negative HPV, no malignancy.    HISTORY:  Past Medical History:  Diagnosis Date  . Alcoholic pancreatitis   . Asthma    Has albuterol inhaler  . Diabetes mellitus without complication ([place])    Type 2 diagnosed 2013   Past Surgical History:  Procedure Laterality Date  . NO PAST SURGERIES     Family History  Problem Relation Age of Onset  . Diabetes Father   . Depression Paternal Grandmother   . Depression Paternal Grandfather   . Depression Cousin     Allergies: Patient has no known allergies. Current Outpatient Prescriptions on File Prior to Visit  Medication Sig Dispense Refill  . albuterol (PROAIR HFA) 108 (90 Base) MCG/ACT inhaler Inhale 2 puffs into the lungs every 6 (six) hours as needed for wheezing or shortness of breath. 1 Inhaler 1  . cetirizine (ZYRTEC) 10 MG tablet Take 10 mg by mouth daily as needed for allergies.     . Insulin Pen Needle 31G X 5 MM MISC Use when taking insulin. 100 each 11  . Insulin Syringe-Needle U-100 (INSULIN SYRINGE .5CC/30GX5/16") 30G X 5/16" 0.5 ML MISC Use as directed when taking insulin. 100 each 11  . LANTUS 100 UNIT/ML injection INJECT 20 UNITS SUBCUTANEOUSLY AT BEDTIME 10 mL 0  . NOVOLOG FLEXPEN 100 UNIT/ML  FlexPen INJECT 3 UNITS INTO THE SKIN 3 (THREE) TIMES DAILY WITH MEALS. PER SLIDING SCALE 15 pen 1  . triamcinolone ointment (KENALOG) 0.1 % Apply 1 application topically 2 (two) times daily. 80 g 3  . varenicline (CHANTIX STARTING MONTH PAK) 0.5 MG X 11 & 1 MG X 42 tablet Take one 0.5 mg tablet by mouth once daily for 3 days, then increase to one 0.5 mg tablet twice daily for 4 days, then increase to one 1 mg tablet twice daily. 53 tablet 0   No current facility-administered medications on file prior to visit.     Social History  Substance Use Topics  . Smoking status: Current Every Day Smoker    Packs/day: 0.50    Years: 25.00    Types: Cigarettes  . Smokeless tobacco: Never Used     Comment: Has Chantix  . Alcohol use 1.8 oz/week    3 Cans of beer per week    Review of Systems  Constitutional: Negative for chills and fever.  Respiratory: Negative for cough.   Cardiovascular: Negative for chest pain and palpitations.  Gastrointestinal: Positive for anal bleeding. Negative for abdominal distention, abdominal pain, blood in stool, constipation, nausea and vomiting.  Genitourinary: Positive for vaginal bleeding. Negative for dyspareunia, dysuria, frequency, vaginal discharge and vaginal pain.  Objective:    BP 124/82   Pulse 95   Temp 98.3 F (36.8 C) (Oral)   Ht 5' 3.5" (1.613 m)   Wt 117 lb 9.6 oz (53.3 kg)   SpO2 98%   BMI 20.51 kg/m    Physical Exam  Constitutional: She appears well-developed and well-nourished.  Eyes: Conjunctivae are normal.  Cardiovascular: Normal rate, regular rhythm, normal heart sounds and normal pulses.   Pulmonary/Chest: Effort normal and breath sounds normal. She has no wheezes. She has no rhonchi. She has no rales.  Genitourinary: Rectum normal and uterus normal. Rectal exam shows no external hemorrhoid, no internal hemorrhoid, no fissure, no mass, no tenderness and guaiac negative stool. Cervix exhibits no motion tenderness. Right adnexum  displays no mass, no tenderness and no fullness. Left adnexum displays no mass, no tenderness and no fullness. There is bleeding in the vagina. No erythema or tenderness in the vagina. No foreign body in the vagina. No signs of injury around the vagina. No vaginal discharge found.  Genitourinary Comments: Blood seen in vaginal cavity and coming from os.   No appreciated hemorrhoids.   Neurological: She is alert.  Skin: Skin is warm and dry.  Psychiatric: She has a normal mood and affect. Her speech is normal and behavior is normal. Thought content normal.  Vitals reviewed.      Assessment & Plan:   Problem List Items Addressed This Visit      Digestive   Rectal bleeding    No hemorrhoids appreciated. Pending stool cards. Will empirically for hemorrhoids. F/u one month      Relevant Medications   hydrocortisone (ANUSOL-HC) 25 MG suppository   Other Relevant Orders   Fecal occult blood, imunochemical     Other   Vaginal bleeding - Primary    Etiology unknown at this time. Pap done. Discussed with patient variations of normal in which this could be one irregular month. If continues, persists, we will pursue further evaluation with labs, GYN consult. F/u one month.       Relevant Orders   Cytology - PAP   Pregnancy, urine        I am having Ms. Alaniz start on hydrocortisone. I am also having her maintain her cetirizine, albuterol, Insulin Pen Needle, varenicline, INSULIN SYRINGE .5CC/30GX5/16", triamcinolone ointment, LANTUS, and NOVOLOG FLEXPEN.   Meds ordered this encounter  Medications  . hydrocortisone (ANUSOL-HC) 25 MG suppository    Sig: Place 1 suppository (25 mg total) rectally 2 (two) times daily.    Dispense:  12 suppository    Refill:  0    Order Specific Question:   Supervising Provider    Answer:   Crecencio Mc [[id number]]    Return precautions given.   Risks, benefits, and alternatives of the medications and treatment plan prescribed today were  discussed, and patient expressed understanding.   Education regarding symptom management and diagnosis given to patient on AVS.  Continue to follow with Burnard Hawthorne, FNP for routine health maintenance.   Mary Haney and I agreed with plan.   Mable Paris, FNP

## 2017-08-18 NOTE — Assessment & Plan Note (Addendum)
Etiology unknown at this time. Pap done. Discussed with patient variations of normal in which this could be one irregular month. If continues, persists, we will pursue further evaluation with labs, GYN consult. F/u one month.

## 2017-08-18 NOTE — Assessment & Plan Note (Addendum)
No hemorrhoids appreciated. Pending stool cards. Will empirically for hemorrhoids. F/u one month

## 2017-08-19 LAB — CYTOLOGY - PAP
DIAGNOSIS: NEGATIVE
HPV: NOT DETECTED

## 2017-08-21 NOTE — Progress Notes (Signed)
Phone does not work.

## 2017-08-25 ENCOUNTER — Other Ambulatory Visit (INDEPENDENT_AMBULATORY_CARE_PROVIDER_SITE_OTHER): Payer: BLUE CROSS/BLUE SHIELD

## 2017-08-25 DIAGNOSIS — K625 Hemorrhage of anus and rectum: Secondary | ICD-10-CM

## 2017-08-25 LAB — FECAL OCCULT BLOOD, IMMUNOCHEMICAL: Fecal Occult Bld: NEGATIVE

## 2017-08-28 NOTE — Progress Notes (Signed)
Unable to reach patient. Phone number is not allowing me to leave VM

## 2017-09-04 NOTE — Progress Notes (Signed)
Closing note due to patient not returning call back.

## 2017-09-15 ENCOUNTER — Ambulatory Visit (INDEPENDENT_AMBULATORY_CARE_PROVIDER_SITE_OTHER): Payer: BLUE CROSS/BLUE SHIELD | Admitting: Internal Medicine

## 2017-09-15 ENCOUNTER — Encounter: Payer: Self-pay | Admitting: Internal Medicine

## 2017-09-15 VITALS — BP 118/68 | HR 100 | Ht 63.0 in | Wt 115.0 lb

## 2017-09-15 DIAGNOSIS — E1065 Type 1 diabetes mellitus with hyperglycemia: Secondary | ICD-10-CM | POA: Diagnosis not present

## 2017-09-15 LAB — COMPLETE METABOLIC PANEL WITH GFR
AG Ratio: 1.6 (calc) (ref 1.0–2.5)
ALKALINE PHOSPHATASE (APISO): 60 U/L (ref 33–115)
ALT: 15 U/L (ref 6–29)
AST: 25 U/L (ref 10–30)
Albumin: 4.6 g/dL (ref 3.6–5.1)
BILIRUBIN TOTAL: 0.5 mg/dL (ref 0.2–1.2)
BUN: 10 mg/dL (ref 7–25)
CHLORIDE: 100 mmol/L (ref 98–110)
CO2: 29 mmol/L (ref 20–32)
CREATININE: 0.68 mg/dL (ref 0.50–1.10)
Calcium: 9.7 mg/dL (ref 8.6–10.2)
GFR, Est African American: 123 mL/min/{1.73_m2} (ref >=60)
GFR, Est Non African American: 106 mL/min/{1.73_m2} (ref >=60)
GLOBULIN: 2.8 g/dL (ref 1.9–3.7)
Glucose, Bld: 114 mg/dL — ABNORMAL HIGH (ref 65–99)
Potassium: 4.9 mmol/L (ref 3.5–5.3)
SODIUM: 136 mmol/L (ref 135–146)
Total Protein: 7.4 g/dL (ref 6.1–8.1)

## 2017-09-15 LAB — TSH: TSH: 0.89 u[IU]/mL (ref 0.35–4.50)

## 2017-09-15 LAB — LIPID PANEL
CHOL/HDL RATIO: 2
Cholesterol: 157 mg/dL (ref 0–200)
HDL: 85.7 mg/dL (ref >39.00)
LDL CALC: 53 mg/dL (ref 0–99)
NonHDL: 71.7
TRIGLYCERIDES: 95 mg/dL (ref 0.0–149.0)
VLDL: 19 mg/dL (ref 0.0–40.0)

## 2017-09-15 LAB — MICROALBUMIN / CREATININE URINE RATIO
Creatinine,U: 191.5 mg/dL
MICROALB UR: 1.4 mg/dL (ref 0.0–1.9)
Microalb Creat Ratio: 0.7 mg/g (ref 0.0–30.0)

## 2017-09-15 LAB — POCT GLYCOSYLATED HEMOGLOBIN (HGB A1C): HEMOGLOBIN A1C: 7.9

## 2017-09-15 MED ORDER — FREESTYLE LIBRE SENSOR SYSTEM MISC: 1.0000 | 11 refills | Status: DC

## 2017-09-15 MED ORDER — INSULIN DEGLUDEC 200 UNIT/ML ~~LOC~~ SOPN: 14.0000 [IU] | PEN_INJECTOR | Freq: Every day | SUBCUTANEOUS | 5 refills | Status: DC

## 2017-09-15 MED ORDER — FREESTYLE LIBRE READER DEVI: 1.0000 | Freq: Three times a day (TID) | 1 refills | Status: DC

## 2017-09-15 NOTE — Progress Notes (Signed)
Patient ID: Mary Haney, female   DOB: 1971-07-11, 46 y.o.   MRN: [id number]  HPI: Mary Haney is a 46 y.o.-year-old female, returning for f/u for DM, dx 2013, insulin-dependent, uncontrolled, without long term complications. Last visit 5.5 mo ago.  Reviewed hx: In 07/2012 she went to the ED because she was not feeling well. She was found to have a blood glucose level of 600 + and was admitted with DKA. She was followed by [name] immediately following initial diagnosis, then switched to [facility] endocrinology.   In 10/2013, due to lack of finances, she was stretching the insulin doses >> sugars high >> low CBG 25-26 >> ED.    She was admitted in 04/2016 for DKA.  At the end of 2017, she met the DM educator and nutritionist >> decided for Omnipod pump >> but nobody called her back >> this year cannot afford it.  She decided against a pump for now.  She is active at work >> sometimes misses the insulin.  Reviewed HbA1c levels: Lab Results  Component Value Date   HGBA1C 8.1 04/02/2017   HGBA1C 9.0 11/04/2016   HGBA1C 10.8 (H) 08/07/2016   Pt is on a regimen of: - Lantus 10 units in am and 5 units at night - Novolog mealtime as follows: - 1-2 units before a snack  - 4 units before a smaller meal - 5-6 units before a larger meal - Novolog SSI: target 120, ISF 50  Pt checks her sugars 2-3x a day: - am: 171-220 >> 110-170, but 200-250 if forgetting Lantus >> 68 maybe once a week, 83-130, 173, 200 - 2h after b'fast: (1 unit- snack) >> 41-401 >> n/c - before lunch: (3-4 units) >> 58-395 >> 80-120 >> 80-150, 270 >> 110-120s - 2h after lunch: n/c >> 378, 395 >> n/c - before dinner: 89-118 (4-5 units) >> 357-HI >> 130s >> 130s >> 110-120s - 2h after dinner: n/c  (1 unit - snack) >> n/c >> 32 x1, 179, 415 >> n/c - bedtime: n/c >> 130s >> n/c >> 120-170 >> 120-130s - nighttime: n/c Lowest sugar was 38 x1 >> 32 after dinner x1, 40 in am >> 3 am: 51 >> 68 ; she has hypoglycemia  awareness at 60.  Highest sugar was HI >> 220 >> 358.  Has ReliOn meter.  Pt's meals are: - Breakfast: oatmeal, yoghurt, sausage bisquit - Lunch: salad, soup, sandwiches - Dinner: meat + starch + vegetables - Snacks: yoghurt, peanut crackers, nuts  - No CKD: Lab Results  Component Value Date   BUN 8 05/07/2016   Lab Results  Component Value Date   CREATININE 0.61 05/07/2016   - No HL Lab Results  Component Value Date   CHOL 145 05/07/2016   HDL 67.60 05/07/2016   LDLCALC 51 05/07/2016   TRIG 131.0 05/07/2016   CHOLHDL 2 05/07/2016   - last eye exam was in 2016 >> No DR - she denies numbness and tingling in her feet  ROS: Constitutional: no weight gain/no weight loss, no fatigue, no subjective hyperthermia, no subjective hypothermia Eyes: no blurry vision, no xerophthalmia ENT: no sore throat, no nodules palpated in throat, no dysphagia, no odynophagia, no hoarseness Cardiovascular: no CP/no SOB/no palpitations/no leg swelling Respiratory: no cough/no SOB/no wheezing Gastrointestinal: no N/no V/no D/no C/no acid reflux Musculoskeletal: no muscle aches/no joint aches Skin: no rashes, no hair loss Neurological: no tremors/no numbness/no tingling/no dizziness  I reviewed pt's medications, allergies, PMH, social hx, family hx, and  changes were documented in the history of present illness. Otherwise, unchanged from my initial visit note.   Past Medical History:  Diagnosis Date  . Alcoholic pancreatitis   . Asthma    Has albuterol inhaler  . Diabetes mellitus without complication ([place])    Type 2 diagnosed 2013   Past Surgical History:  Procedure Laterality Date  . NO PAST SURGERIES     History   Social History  . Marital Status: Single    Spouse Name: N/A    Number of Children: 0  . Years of Education: 14   Occupational History  . Take Out Specialist     [place]   Social History Main Topics  . Smoking status: Current Every Day Smoker -- 0.50  packs/day for 25 years    Types: Cigarettes  . Smokeless tobacco: Never Used  . Alcohol Use: 1.8 oz/week    3 Cans of beer per week  . Drug Use: No  . Sexual Activity: Yes    Birth Control/ Protection: Condom   Social History Narrative   Mary Haney was born in [place], [place]. She moved with her family to [place] at age 12. She attended 2 years at [employer] and was majoring in [place]. Her goal is to become a Optometrist in the [employer]. 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 Rotha lives with her boyfriend, Mary Haney. They are considering getting married soon. She enjoys sketching outdoor scenery, cartoons and loves to read.   Current Outpatient Prescriptions on File Prior to Visit  Medication Sig Dispense Refill  . albuterol (PROAIR HFA) 108 (90 Base) MCG/ACT inhaler Inhale 2 puffs into the lungs every 6 (six) hours as needed for wheezing or shortness of breath. 1 Inhaler 1  . cetirizine (ZYRTEC) 10 MG tablet Take 10 mg by mouth daily as needed for allergies.     . hydrocortisone (ANUSOL-HC) 25 MG suppository Place 1 suppository (25 mg total) rectally 2 (two) times daily. 12 suppository 0  . Insulin Pen Needle 31G X 5 MM MISC Use when taking insulin. 100 each 11  . Insulin Syringe-Needle U-100 (INSULIN SYRINGE .5CC/30GX5/16") 30G X 5/16" 0.5 ML MISC Use as directed when taking insulin. 100 each 11  . LANTUS 100 UNIT/ML injection INJECT 20 UNITS SUBCUTANEOUSLY AT BEDTIME 10 mL 0  . NOVOLOG FLEXPEN 100 UNIT/ML FlexPen INJECT 3 UNITS INTO THE SKIN 3 (THREE) TIMES DAILY WITH MEALS. PER SLIDING SCALE 15 pen 1  . triamcinolone ointment (KENALOG) 0.1 % Apply 1 application topically 2 (two) times daily. 80 g 3  . varenicline (CHANTIX STARTING MONTH PAK) 0.5 MG X 11 & 1 MG X 42 tablet Take one 0.5 mg tablet by mouth once daily for 3 days, then increase to one 0.5 mg tablet twice daily for 4 days, then increase to one 1 mg tablet twice daily. 53 tablet 0   No  current facility-administered medications on file prior to visit.    No Known Allergies Family History  Problem Relation Age of Onset  . Diabetes Father   . Depression Paternal Grandmother   . Depression Paternal Grandfather   . Depression Cousin    PE: BP 118/68 (BP Location: Left Arm, Patient Position: Sitting)   Pulse 100   Ht '5\' 3"'  (1.6 m)   Wt 115 lb (52.2 kg)   LMP 09/13/2017   SpO2 98%   BMI 20.37 kg/m  Wt Readings from Last 3 Encounters:  09/15/17 115 lb (  52.2 kg)  08/18/17 117 lb 9.6 oz (53.3 kg)  06/09/17 118 lb 2 oz (53.6 kg)   Constitutional: normal weight, in NAD Eyes: PERRLA, EOMI, no exophthalmos ENT: moist mucous membranes, no thyromegaly, no cervical lymphadenopathy Cardiovascular: RRR, No MRG Respiratory: CTA B Gastrointestinal: abdomen soft, NT, ND, BS+ Musculoskeletal: no deformities, strength intact in all 4 Skin: moist, warm, no rashes Neurological: no tremor with outstretched hands, DTR normal in all 4  ASSESSMENT: 1. DM, insulin-dependent, uncontrolled, with complications - hyper- and hypo-glycemia - DKA  Component     Latest Ref Rng & Units 11/04/2016  Hemoglobin A1C      9.0  C-Peptide     0.80 - 3.85 ng/mL <0.10 (L)  Glucose, Fasting     65 - 99 mg/dL 40 (L)  Glutamic Acid Decarb Ab     <5 IU/mL >250 (H)  Pancreatic Islet Cell Antibody     <5 JDF Units <5   Glucose very low at the time of the blood draw, therefore, C-peptide is not interpretable. However, GAD antibodies are undetectably high, confirming type 1 diabetes.  PLAN:  1. Patient with DM1, On basal-bolus insulin regimen, with better control at this visit as she is doing better with her insulin doses. She likes to split Lantus dose better and also likes having a higher dose of Lantus in the morning in the lower dose at night. However, her sugars in the morning are very fluctuating and we discussed about possibly switching to [place], which has a longer half life and a better  pharmacodynamic profile with fewer low blood sugars compared to Lantus. She agrees with this and I gave her a coupon card for it. - her sugars throughout the day are very good, so I do not feel that we need to change her NovoLog doses for now. - she is not interested in an insulin pump but at today's visit we also discussed about the FreeStyle libre CGM and I explained how this works. I explained that this does not have alarms, so it is not ideal for type 1 diabetes, But it is definitely better than just fingersticks. She would like to tr it I sent a prescription to the pharmacy.  -  I suggested to:  Patient Instructions  Please continue: - Novolog mealtime as follows: - 1-2 units before a snack  - 4 units before a smaller meal - 5-6 units before a larger meal - Novolog SSI: target 120, ISF 50  Please change Lantus to [place]: 14 units daily.  Try to get the [employer] CGM.  Please return in 3 months with your sugar log.   - today, HbA1c is 7.9% (a little better) - continue checking sugars at different times of the day - check 3x a day, rotating checks - advised for yearly eye exams >> she needs one - She refuses the flu shot today  - We'll check her annual labs today-  - Return to clinic in 3 mo with sugar log   - time spent with the patient: 25 min, of which >50% was spent in reviewing her CBGs, discussing his hypo- and hyper-glycemic episodes, reviewing  previous labs and insulin doses and developing a plan to avoid hypo- and hyper-glycemia.   Component     Latest Ref Rng & Units 09/15/2017          Glucose     65 - 99 mg/dL 114 (H)  BUN     7 - 25 mg/dL 10  Creatinine     0.50 - 1.10 mg/dL 0.68  GFR, Est Non African American     > OR = 60 mL/min/1.32m 106  GFR, Est African American     > OR = 60 mL/min/1.717m123  Sodium     135 - 146 mmol/L 136  Potassium     3.5 - 5.3 mmol/L 4.9  Chloride     98 - 110 mmol/L 100  CO2     20 - 32 mmol/L 29  Calcium     8.6 -  10.2 mg/dL 9.7  Total Protein     6.1 - 8.1 g/dL 7.4  Albumin MSPROF     3.6 - 5.1 g/dL 4.6  Globulin     1.9 - 3.7 g/dL (calc) 2.8  AG Ratio     1.0 - 2.5 (calc) 1.6  Total Bilirubin     0.2 - 1.2 mg/dL 0.5  Alkaline phosphatase (APISO)     33 - 115 U/L 60  AST     10 - 30 U/L 25  ALT     6 - 29 U/L 15  Cholesterol     0 - 200 mg/dL 157  Triglycerides     0.0 - 149.0 mg/dL 95.0  HDL Cholesterol     >39.00 mg/dL 85.70  VLDL     0.0 - 40.0 mg/dL 19.0  LDL (calc)     0 - 99 mg/dL 53  Total CHOL/HDL Ratio      2  NonHDL      71.70  Microalb, Ur     0.0 - 1.9 mg/dL 1.4  Creatinine,U     mg/dL 191.5  MICROALB/CREAT RATIO     0.0 - 30.0 mg/g 0.7  Hemoglobin A1C      7.9  TSH     0.35 - 4.50 uIU/mL 0.89   Labs are all at goal, except Glu and HBA1c.  CrPhilemon KingdomMD PhD [facility]

## 2017-09-15 NOTE — Patient Instructions (Addendum)
Please continue: - Novolog mealtime as follows: - 1-2 units before a snack  - 4 units before a smaller meal - 5-6 units before a larger meal - Novolog SSI: target 120, ISF 50  Please change Lantus to [place]: 14 units daily.  Try to get the [employer] CGM.  Please return in 3 months with your sugar log.

## 2017-09-24 ENCOUNTER — Ambulatory Visit: Payer: BLUE CROSS/BLUE SHIELD | Admitting: Family

## 2017-09-24 DIAGNOSIS — Z0289 Encounter for other administrative examinations: Secondary | ICD-10-CM

## 2017-09-29 ENCOUNTER — Other Ambulatory Visit: Payer: Self-pay | Admitting: Family Medicine

## 2017-09-30 ENCOUNTER — Telehealth: Payer: Self-pay | Admitting: Family

## 2017-09-30 NOTE — Telephone Encounter (Signed)
Pt called about wanting to know if she can have someone show her how to apply it which is the Continuous Blood Gluc Sensor ([place]) MISC. Pt would like help with how to use it? Please advise?  Call pt @ [phone]. Thank you!

## 2017-10-01 NOTE — Telephone Encounter (Signed)
Pt has an appt with Mary Haney on 10/29

## 2017-10-01 NOTE — Telephone Encounter (Signed)
Please schedule nurse visit

## 2017-10-05 ENCOUNTER — Ambulatory Visit (INDEPENDENT_AMBULATORY_CARE_PROVIDER_SITE_OTHER): Payer: BLUE CROSS/BLUE SHIELD | Admitting: Pharmacist

## 2017-10-05 DIAGNOSIS — E1065 Type 1 diabetes mellitus with hyperglycemia: Secondary | ICD-10-CM | POA: Diagnosis not present

## 2017-10-05 NOTE — Progress Notes (Addendum)
Patient referred on 10/01/2018 by Mable Paris, NP for CGM insertion technique teaching. Patient's DM managed by her endocrinologist, Dr. Cruzita Lederer. Patient brings Freestyle Libre CGM system and supplies.   With instruction, patient inserted first sensor 10/05/2017. Educated patient on DDI including vitamin C and salicylic acid, care for CGM, and how to use reader. Patient verbalized understanding and expressed thanks.   Also discussed smoking cessation - has not started Chantix yet but is excited to. Patient educated on purpose, proper use and potential adverse effects of Chantix.    Carlean Jews, Pharm.D. PGY2 Ambulatory Care Pharmacy Resident Phone: [phone]   Agree with plan. Mable Paris, NP

## 2017-10-05 NOTE — Patient Instructions (Signed)
Thank you for coming in today! Congrats on your new sensor!!  1. Buy SkinTac to use to make your skin stickier  2. Tegaderm is the clear bandage that we recommend.   My phone number is [phone]. Call with any issues!

## 2017-11-22 ENCOUNTER — Other Ambulatory Visit: Payer: Self-pay | Admitting: Family

## 2017-12-11 ENCOUNTER — Ambulatory Visit: Payer: BLUE CROSS/BLUE SHIELD | Admitting: Family Medicine

## 2017-12-11 ENCOUNTER — Other Ambulatory Visit: Payer: Self-pay

## 2017-12-11 ENCOUNTER — Encounter: Payer: Self-pay | Admitting: Family Medicine

## 2017-12-11 VITALS — BP 120/80 | HR 102 | Temp 98.5°F | Wt 115.8 lb

## 2017-12-11 DIAGNOSIS — L608 Other nail disorders: Secondary | ICD-10-CM | POA: Insufficient documentation

## 2017-12-11 DIAGNOSIS — E1065 Type 1 diabetes mellitus with hyperglycemia: Secondary | ICD-10-CM | POA: Diagnosis not present

## 2017-12-11 DIAGNOSIS — K625 Hemorrhage of anus and rectum: Secondary | ICD-10-CM | POA: Diagnosis not present

## 2017-12-11 DIAGNOSIS — F17210 Nicotine dependence, cigarettes, uncomplicated: Secondary | ICD-10-CM | POA: Diagnosis not present

## 2017-12-11 DIAGNOSIS — N939 Abnormal uterine and vaginal bleeding, unspecified: Secondary | ICD-10-CM

## 2017-12-11 HISTORY — DX: Hemorrhage of anus and rectum: K62.5

## 2017-12-11 LAB — CBC
HEMATOCRIT: 40.5 % (ref 36.0–46.0)
HEMOGLOBIN: 13.6 g/dL (ref 12.0–15.0)
MCHC: 33.5 g/dL (ref 30.0–36.0)
MCV: 95 fl (ref 78.0–100.0)
PLATELETS: 373 10*3/uL (ref 150.0–400.0)
RBC: 4.27 Mil/uL (ref 3.87–5.11)
RDW: 12.6 % (ref 11.5–15.5)
WBC: 6.4 10*3/uL (ref 4.0–10.5)

## 2017-12-11 MED ORDER — VARENICLINE TARTRATE 0.5 MG X 11 & 1 MG X 42 PO MISC: ORAL | 0 refills | Status: DC

## 2017-12-11 NOTE — Assessment & Plan Note (Signed)
Likely hemorrhoidal although given age will refer to GI to consider further evaluation.

## 2017-12-11 NOTE — Assessment & Plan Note (Signed)
Has quit smoking about a week ago.  We will trial her on Chantix to assist in this effort.  Discussed the possibility of abnormal dreams with this.  If she has side effects she will let us know.

## 2017-12-11 NOTE — Progress Notes (Signed)
Tommi Rumps, MD Phone: [phone]  Mary Haney is a 47 y.o. female who presents today for follow-up.  Diabetes: Has been relatively well controlled per her report.  No highs or lows.  She is seeing endocrinology.  Taking Lantus 10 units in the morning and 8 units at night.  NovoLog sliding scale.  No polyuria or polydipsia.  Needs to see ophthalmology.  Previously seen by her PCP and had abnormal spotting between menstrual cycles that occurred for a week or 2 on one occasion.  This has not recurred since then.  She has been having normal monthly menstrual cycles lasting 4 days since then.  She also had rectal bleeding around that time.  She does have a history of hemorrhoids.  Notes his bright red spotting on toilet paper when she wiped.  It has not recurred.  No family history of colon cancer.  She completed stool cards which were negative.  Reports the edges of her right great toenail are growing in.  No discomfort no she wants to stay on top of it.  She has seen a podiatrist previously.  Tobacco abuse: No she quit smoking about a week ago.  She was smoking 1 pack/day.  Still craves cigarettes.  She wants to try Chantix.  Social History   Tobacco Use  Smoking Status Current Every Day Smoker  . Packs/day: 0.50  . Years: 25.00  . Pack years: 12.50  . Types: Cigarettes  Smokeless Tobacco Never Used  Tobacco Comment   Has Chantix     ROS see history of present illness  Objective  Physical Exam Vitals:   12/11/17 0810 12/11/17 0837  BP: 120/80   Pulse: (!) 112 (!) 102  Temp: 98.5 F (36.9 C)   SpO2: 99%     BP Readings from Last 3 Encounters:  12/11/17 120/80  09/15/17 118/68  08/18/17 124/82   Wt Readings from Last 3 Encounters:  12/11/17 115 lb 12.8 oz (52.5 kg)  09/15/17 115 lb (52.2 kg)  08/18/17 117 lb 9.6 oz (53.3 kg)    Physical Exam  Constitutional: No distress.  Cardiovascular: Normal rate, regular rhythm and normal heart sounds.    Pulmonary/Chest: Effort normal and breath sounds normal.  Musculoskeletal: She exhibits no edema.  Neurological: She is alert. Gait normal.  Skin: Skin is warm and dry. She is not diaphoretic.  No ingrown nail noted on right great toe     Assessment/Plan: Please see individual problem list.  Rectal bleeding Likely hemorrhoidal although given age will refer to GI to consider further evaluation.  Uncontrolled type 1 diabetes mellitus with hyperglycemia, with long-term current use of insulin ([place]) Discussed need to see ophthalmology.  She will see endocrinology as planned.  Toenail deformity Refer to podiatry.  Nicotine dependence Has quit smoking about a week ago.  We will trial her on Chantix to assist in this effort.  Discussed the possibility of abnormal dreams with this.  If she has side effects she will let us know.  Vaginal bleeding Resolved.  Normal menstrual cycles over the last several months.  If she has recurrent issues she will let us know and will refer to gynecology.   Mary Haney was seen today for establish care.  Diagnoses and all orders for this visit:  BRBPR (bright red blood per rectum) -     CBC -     Ambulatory referral to Gastroenterology  Uncontrolled type 1 diabetes mellitus with hyperglycemia, with long-term current use of insulin ([place]) -  Ambulatory referral to Ophthalmology  Toenail deformity -     Ambulatory referral to Podiatry  Rectal bleeding  Cigarette nicotine dependence without complication  Vaginal bleeding  Other orders -     varenicline (CHANTIX STARTING MONTH PAK) 0.5 MG X 11 & 1 MG X 42 tablet; Take one 0.5 mg tablet by mouth once daily for 3 days, then increase to one 0.5 mg tablet twice daily for 4 days, then increase to one 1 mg tablet twice daily.    Orders Placed This Encounter  Procedures  . CBC  . Ambulatory referral to Gastroenterology    Referral Priority:   Routine    Referral Type:   Consultation    Referral  Reason:   Specialty Services Required    Number of Visits Requested:   1  . Ambulatory referral to Podiatry    Referral Priority:   Routine    Referral Type:   Consultation    Referral Reason:   Specialty Services Required    Requested Specialty:   Podiatry    Number of Visits Requested:   1  . Ambulatory referral to Ophthalmology    Referral Priority:   Routine    Referral Type:   Consultation    Referral Reason:   Specialty Services Required    Requested Specialty:   Ophthalmology    Number of Visits Requested:   1    Meds ordered this encounter  Medications  . varenicline (CHANTIX STARTING MONTH PAK) 0.5 MG X 11 & 1 MG X 42 tablet    Sig: Take one 0.5 mg tablet by mouth once daily for 3 days, then increase to one 0.5 mg tablet twice daily for 4 days, then increase to one 1 mg tablet twice daily.    Dispense:  53 tablet    Refill:  0     Tommi Rumps, MD [place]

## 2017-12-11 NOTE — Assessment & Plan Note (Signed)
Discussed need to see ophthalmology.  She will see endocrinology as planned.

## 2017-12-11 NOTE — Assessment & Plan Note (Addendum)
Refer to podiatry

## 2017-12-11 NOTE — Patient Instructions (Signed)
Nice to see you. Please see your endocrinologist as planned. We will try back on Chantix. We will get you in to see podiatry and ophthalmology. Given your prior bleeding I would suggest doing a CBC which we can do today.  I would also suggest seeing GI.

## 2017-12-11 NOTE — Assessment & Plan Note (Signed)
Resolved.  Normal menstrual cycles over the last several months.  If she has recurrent issues she will let us know and will refer to gynecology.

## 2017-12-17 ENCOUNTER — Ambulatory Visit: Payer: BLUE CROSS/BLUE SHIELD | Admitting: Internal Medicine

## 2017-12-17 ENCOUNTER — Encounter: Payer: Self-pay | Admitting: Internal Medicine

## 2017-12-17 VITALS — BP 120/70 | HR 98 | Ht 63.0 in | Wt 118.0 lb

## 2017-12-17 DIAGNOSIS — E1065 Type 1 diabetes mellitus with hyperglycemia: Secondary | ICD-10-CM

## 2017-12-17 DIAGNOSIS — E0789 Other specified disorders of thyroid: Secondary | ICD-10-CM | POA: Diagnosis not present

## 2017-12-17 LAB — POCT GLYCOSYLATED HEMOGLOBIN (HGB A1C): HEMOGLOBIN A1C: 7.9

## 2017-12-17 NOTE — Patient Instructions (Signed)
Please change: - Lantus 8 units in am and 7 units at bedtime - Novolog:  Snack: 1-2 units B'fast: 3-4 units Lunch: 3-4 units Dinner: 5 units  Please come back for a follow-up appointment in 3 months.

## 2017-12-17 NOTE — Addendum Note (Signed)
Addended by: Drucilla Schmidt on: 12/17/2017 04:29 PM   Modules accepted: Orders

## 2017-12-17 NOTE — Progress Notes (Signed)
Patient ID: Mary Haney, female   DOB: 01/01/1971, 47 y.o.   MRN: [id number]  HPI: Mary Haney is a 47 y.o.-year-old female, returning for f/u for DM, dx 2013, insulin-dependent, uncontrolled, without long term complications. Last visit was 3 months ago. PCP: Dr. Caryl Bis  Reviewed hx: In 07/2012 she went to the ED because she was not feeling well. She was found to have a blood glucose level of 600 + and was admitted with DKA. She was followed by [name] immediately following initial diagnosis, then switched to [facility] endocrinology.   In 10/2013, due to lack of finances, she was stretching the insulin doses >> sugars high >> low CBG 25-26 >> ED.    She was admitted in 04/2016 for DKA.  At the end of 2017, she met the DM educator and nutritionist >> decided for Omnipod pump >> but nobody called her back >> this year cannot afford it.  She is still decided against an insulin pump.  Since last visit, she started the freestyle libre CGM.  She loves it.  She is active at work >> sometimes misses the insulin with lunch.  Reviewed HbA1c levels: Lab Results  Component Value Date   HGBA1C 7.9 09/15/2017   HGBA1C 8.1 04/02/2017   HGBA1C 9.0 11/04/2016   Pt is on a regimen of: - Lantus 10 units in am and 5 units at night She could not afford Tresiba 14 units daily. - Novolog mealtime as follows: - 1-2 units before a snack  - 3-4 units before a b'fast - 1-2 units before lunch - 5 units before dinner   Pt checks her sugars 2-3 times a day: - am:  68 maybe once a week, 83-130, 173, 200 >> 170-180 - 2h after b'fast: (1 unit- snack) >> 41-401 >> n/c - before lunch: (3-4 units) 80-150, 270 >> 110-120s >> 80-100 - 2h after lunch: n/c >> 378, 395 >> n/c - before dinner:  130s >> 130s >> 110-120s >> 160-170 - 2h after dinner:  32 x1, 179, 415 >> n/c - bedtime: n/c >> 120-170 >> 120-130s >> 160-170 - nighttime: n/c Lowest sugar was 38 x1 >> 32 after dinner x1, 40 in am >> 3 am:  51 >> 68 >> 70 ; she has hypoglycemia awareness at 60.  Highest sugar was 358 >> 308.  Has ReliOn meter.  Pt's meals are: - Breakfast: oatmeal, yoghurt, sausage bisquit - Lunch: salad, soup, sandwiches - Dinner: meat + starch + vegetables - Snacks: yoghurt, peanut crackers, nuts  -No CKD: Lab Results  Component Value Date   BUN 10 09/15/2017   Lab Results  Component Value Date   CREATININE 0.68 09/15/2017   - No HL: Lab Results  Component Value Date   CHOL 157 09/15/2017   HDL 85.70 09/15/2017   LDLCALC 53 09/15/2017   TRIG 95.0 09/15/2017   CHOLHDL 2 09/15/2017   - last eye exam was in 2016 >> No DR. She will see an eye dr. soon. - no numbness and tingling in her feet  Last TSH normal: Lab Results  Component Value Date   TSH 0.89 09/15/2017   ROS: Constitutional: no weight gain/no weight loss, no fatigue, no subjective hyperthermia, no subjective hypothermia Eyes: no blurry vision, no xerophthalmia ENT: no sore throat, no nodules palpated in throat, no dysphagia, no odynophagia, no hoarseness Cardiovascular: no CP/no SOB/no palpitations/no leg swelling Respiratory: no cough/no SOB/no wheezing Gastrointestinal: no N/no V/no D/no C/no acid reflux Musculoskeletal: no muscle aches/no joint aches  Skin: no rashes, no hair loss Neurological: no tremors/no numbness/no tingling/no dizziness  I reviewed pt's medications, allergies, PMH, social hx, family hx, and changes were documented in the history of present illness. Otherwise, unchanged from my initial visit note.  Past Medical History:  Diagnosis Date  . Alcoholic pancreatitis   . Asthma    Has albuterol inhaler  . Diabetes mellitus without complication ([place])    Type 2 diagnosed 2013   Past Surgical History:  Procedure Laterality Date  . NO PAST SURGERIES     History   Social History  . Marital Status: Single    Spouse Name: N/A    Number of Children: 0  . Years of Education: 14   Occupational History   . Take Out Specialist     [employer]   Social History Main Topics  . Smoking status: Current Every Day Smoker -- 0.50 packs/day for 25 years    Types: Cigarettes  . Smokeless tobacco: Never Used  . Alcohol Use: 1.8 oz/week    3 Cans of beer per week  . Drug Use: No  . Sexual Activity: Yes    Birth Control/ Protection: Condom   Social History Narrative   Caylin was born in [place], [place]. She moved with her family to [place] at age 57. She attended 2 years at [employer] and was majoring in [place]. Her goal is to become a Optometrist in the [employer]. 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 Elienai lives with her boyfriend, [name]. They are considering getting married soon. She enjoys sketching outdoor scenery, cartoons and loves to read.   Current Outpatient Medications on File Prior to Visit  Medication Sig Dispense Refill  . albuterol (PROAIR HFA) 108 (90 Base) MCG/ACT inhaler Inhale 2 puffs into the lungs every 6 (six) hours as needed for wheezing or shortness of breath. 1 Inhaler 1  . cetirizine (ZYRTEC) 10 MG tablet Take 10 mg by mouth daily as needed for allergies.     . Continuous Blood Gluc Receiver (FREESTYLE LIBRE READER) DEVI 1 Device by Does not apply route 3 (three) times daily. 1 Device 1  . Continuous Blood Gluc Sensor (FREESTYLE LIBRE SENSOR SYSTEM) MISC 1 Device by Does not apply route every 30 (thirty) days. 3 each 11  . hydrocortisone (ANUSOL-HC) 25 MG suppository Place 1 suppository (25 mg total) rectally 2 (two) times daily. 12 suppository 0  . Insulin Pen Needle 31G X 5 MM MISC Use when taking insulin. 100 each 11  . Insulin Syringe-Needle U-100 (INSULIN SYRINGE .5CC/30GX5/16") 30G X 5/16" 0.5 ML MISC Use as directed when taking insulin. 100 each 11  . LANTUS 100 UNIT/ML injection INJECT 20 UNITS INTO THE SKIN AT BEDTIME 10 mL 1  . NOVOLOG FLEXPEN 100 UNIT/ML FlexPen INJECT 3 UNITS INTO THE SKIN 3 (THREE) TIMES DAILY WITH  MEALS. PER SLIDING SCALE 15 pen 1  . triamcinolone ointment (KENALOG) 0.1 % Apply 1 application topically 2 (two) times daily. 80 g 3  . varenicline (CHANTIX STARTING MONTH PAK) 0.5 MG X 11 & 1 MG X 42 tablet Take one 0.5 mg tablet by mouth once daily for 3 days, then increase to one 0.5 mg tablet twice daily for 4 days, then increase to one 1 mg tablet twice daily. 53 tablet 0   No current facility-administered medications on file prior to visit.    No Known Allergies Family History  Problem Relation Age of Onset  .  Diabetes Father   . Depression Paternal Grandmother   . Depression Paternal Grandfather   . Depression Cousin    PE: BP 120/70   Pulse 98   Ht 5' 3" (1.6 m)   Wt 118 lb (53.5 kg)   LMP 11/27/2017 (Approximate)   SpO2 98%   BMI 20.90 kg/m  Wt Readings from Last 3 Encounters:  12/17/17 118 lb (53.5 kg)  12/11/17 115 lb 12.8 oz (52.5 kg)  09/15/17 115 lb (52.2 kg)   Constitutional: normal weight, in NAD Eyes: PERRLA, EOMI, no exophthalmos ENT: moist mucous membranes, + palpable thyroid, no cervical lymphadenopathy Cardiovascular: Tachycardia, RR, No MRG Respiratory: CTA B Gastrointestinal: abdomen soft, NT, ND, BS+ Musculoskeletal: no deformities, strength intact in all 4 Skin: moist, warm, no rashes Neurological: no tremor with outstretched hands, DTR normal in all 4  ASSESSMENT: 1. DM, insulin-dependent, uncontrolled, with complications - hyper- and hypo-glycemia - DKA  Component     Latest Ref Rng & Units 11/04/2016  Hemoglobin A1C      9.0  C-Peptide     0.80 - 3.85 ng/mL <0.10 (L)  Glucose, Fasting     65 - 99 mg/dL 40 (L)  Glutamic Acid Decarb Ab     <5 IU/mL >250 (H)  Pancreatic Islet Cell Antibody     <5 JDF Units <5   Glucose very low at the time of the blood draw, therefore, C-peptide is not interpretable. However, GAD antibodies are undetectably high, confirming type 1 diabetes.  2.  Palpable thyroid  PLAN:  1. Patient with type 1  diabetes, on basal-bolus insulin regimen, with slightly worse control at this visit after the holidays.  At last visit, we tried to switch to [place] but this was too expensive so she is still on Lantus twice a day. - Since last visit, she obtained a freestyle libre CGM and she loves it. - Patterns noticed: Sugars are higher in the morning and lower at noon >> will redistribute the Lantus doses to increase the dose at bedtime and decrease the dose in a.m. - Sugars are higher also before dinner >> will increase her insulin with lunch  - She is not using the sliding scale, so I will take her off her list  -  I suggested to:  Patient Instructions  Please change: - Lantus 8 units in am and 7 units at bedtime - Novolog:  Snack: 1-2 units B'fast: 3-4 units Lunch: 3-4 units Dinner: 5 units  Please come back for a follow-up appointment in 3 months.  - today, HbA1c is 7.9% (stable) - continue checking sugars at different times of the day - check 3x a day, rotating checks - advised for yearly eye exams >> she is not up to date but she will have an appointment soon - Return to clinic in 3 mo with sugar log   2.  Palpable thyroid - Symmetric, nonnodular - No neck compression symptoms - Recent TSH was normal  - may need a thyroid ultrasound at next visit.  Philemon Kingdom, MD PhD [facility]

## 2017-12-28 ENCOUNTER — Encounter: Payer: Self-pay | Admitting: Family

## 2018-01-01 ENCOUNTER — Ambulatory Visit: Payer: BLUE CROSS/BLUE SHIELD | Admitting: Gastroenterology

## 2018-01-05 ENCOUNTER — Ambulatory Visit: Payer: BLUE CROSS/BLUE SHIELD | Admitting: Gastroenterology

## 2018-01-07 ENCOUNTER — Ambulatory Visit: Payer: BLUE CROSS/BLUE SHIELD | Admitting: Gastroenterology

## 2018-01-11 ENCOUNTER — Ambulatory Visit: Payer: BLUE CROSS/BLUE SHIELD | Admitting: Podiatry

## 2018-01-18 ENCOUNTER — Encounter: Payer: Self-pay | Admitting: Podiatry

## 2018-01-18 ENCOUNTER — Ambulatory Visit: Payer: BLUE CROSS/BLUE SHIELD | Admitting: Podiatry

## 2018-01-18 DIAGNOSIS — B351 Tinea unguium: Secondary | ICD-10-CM | POA: Diagnosis not present

## 2018-01-18 DIAGNOSIS — L608 Other nail disorders: Secondary | ICD-10-CM

## 2018-01-18 NOTE — Progress Notes (Signed)
This patient returns to the office for continued evaluation and treatment of her right big toenail.  She says that she has been pain-free for 2 months since her last visit.  She says she is only nail starting to develop pain and discomfort along the inside and outside border right great toenail.   This patient is a diabetic on insulin and her hemoglobin A1c runs about 8. She presents the office today for continued conservative treatment of her ingrowing nails right great toe.   General Appearance  Alert, conversant and in no acute stress.  Vascular  Dorsalis pedis and posterior pulses are palpable  bilaterally.  Capillary return is within normal limits  bilaterally. Temperature is within normal limits  Bilaterally.  Neurologic  Senn-Weinstein monofilament wire test within normal limits  bilaterally. Muscle power within normal limits bilaterally.  Nails pincer hallux toenail, right foot.  Marked incurvation along the medial and lateral borders right great toenail.  No evidence of infection or drainage noted.  Orthopedic  No limitations of motion of motion feet bilaterally.  No crepitus or effusions noted.  No bony pathology or digital deformities noted.  Skin  normotropic skin with no porokeratosis noted bilaterally.  No signs of infections or ulcers noted.     Pincer nail right hallux  ROV.  Debridement of  Pincer toenail right hallux.  Discussed continued conservative  versus surgical treatment.  Told this patient that if she improves her numbers  for her diabetes and gets medical approval, we could schedule surgical correction of these ingrowing toenails.  RTC prn for conservative treatment.   Gardiner Barefoot DPM

## 2018-01-25 ENCOUNTER — Ambulatory Visit: Payer: BLUE CROSS/BLUE SHIELD | Admitting: Gastroenterology

## 2018-01-25 ENCOUNTER — Other Ambulatory Visit: Payer: Self-pay

## 2018-01-25 ENCOUNTER — Encounter: Payer: Self-pay | Admitting: Gastroenterology

## 2018-01-25 VITALS — BP 132/69 | HR 106 | Temp 99.0°F | Ht 63.0 in | Wt 117.8 lb

## 2018-01-25 DIAGNOSIS — K640 First degree hemorrhoids: Secondary | ICD-10-CM | POA: Diagnosis not present

## 2018-01-25 DIAGNOSIS — K625 Hemorrhage of anus and rectum: Secondary | ICD-10-CM

## 2018-01-25 NOTE — Progress Notes (Signed)
Cephas Darby, MD [address]  [place]  [place], [place] [address]  Main: [phone]  Fax: [phone]    Gastroenterology Consultation  Referring Provider:     Leone Haven, MD Primary Care Physician:  Burnard Hawthorne, FNP Primary Gastroenterologist:  Dr. Cephas Darby Reason for Consultation:     Rectal bleeding        HPI:   Mary Haney is a 47 y.o. female referred by Dr. Burnard Hawthorne, FNP  for consultation & management of rectal bleeding. She reports having painless bleeding per rectum for the last 2 months, describes it as urgency to have a bowel movement but when she goes, she notices blood dripping in toilet. She denies having constipation, pushing or straining. She has not tried over-the-counter hemorrhoidal creams. She denies any other GI symptoms.She had stool guaiac testing that was negative recently. She has history of diabetes, on insulin, A1c is improving.  NSAIDs: none  Antiplts/Anticoagulants/Anti thrombotics: none  GI Procedures: none She denies family history of colon cancer or other GI malignancies She denies personal history of gastro intestinal surgeries  Past Medical History:  Diagnosis Date  . Alcoholic pancreatitis   . Asthma    Has albuterol inhaler  . Diabetes mellitus without complication ([place])    Type 2 diagnosed 2013    Past Surgical History:  Procedure Laterality Date  . NO PAST SURGERIES       Current Outpatient Medications:  .  albuterol (PROAIR HFA) 108 (90 Base) MCG/ACT inhaler, Inhale 2 puffs into the lungs every 6 (six) hours as needed for wheezing or shortness of breath., Disp: 1 Inhaler, Rfl: 1 .  cetirizine (ZYRTEC) 10 MG tablet, Take 10 mg by mouth daily as needed for allergies. , Disp: , Rfl:  .  Continuous Blood Gluc Receiver (FREESTYLE LIBRE READER) DEVI, 1 Device by Does not apply route 3 (three) times daily., Disp: 1 Device, Rfl: 1 .  Continuous Blood Gluc Sensor (FREESTYLE LIBRE SENSOR  SYSTEM) MISC, 1 Device by Does not apply route every 30 (thirty) days., Disp: 3 each, Rfl: 11 .  hydrocortisone (ANUSOL-HC) 25 MG suppository, Place 1 suppository (25 mg total) rectally 2 (two) times daily., Disp: 12 suppository, Rfl: 0 .  Insulin Pen Needle 31G X 5 MM MISC, Use when taking insulin., Disp: 100 each, Rfl: 11 .  Insulin Syringe-Needle U-100 (INSULIN SYRINGE .5CC/30GX5/16") 30G X 5/16" 0.5 ML MISC, Use as directed when taking insulin., Disp: 100 each, Rfl: 11 .  LANTUS 100 UNIT/ML injection, INJECT 20 UNITS INTO THE SKIN AT BEDTIME, Disp: 10 mL, Rfl: 1 .  NOVOLOG FLEXPEN 100 UNIT/ML FlexPen, INJECT 3 UNITS INTO THE SKIN 3 (THREE) TIMES DAILY WITH MEALS. PER SLIDING SCALE, Disp: 15 pen, Rfl: 1 .  triamcinolone ointment (KENALOG) 0.1 %, Apply 1 application topically 2 (two) times daily., Disp: 80 g, Rfl: 3 .  varenicline (CHANTIX STARTING MONTH PAK) 0.5 MG X 11 & 1 MG X 42 tablet, Take one 0.5 mg tablet by mouth once daily for 3 days, then increase to one 0.5 mg tablet twice daily for 4 days, then increase to one 1 mg tablet twice daily., Disp: 53 tablet, Rfl: 0   Family History  Problem Relation Age of Onset  . Diabetes Father   . Depression Paternal Grandmother   . Depression Paternal Grandfather   . Depression Cousin      Social History   Tobacco Use  . Smoking status: Current Every Day Smoker  Packs/day: 0.50    Years: 25.00    Pack years: 12.50    Types: Cigarettes  . Smokeless tobacco: Never Used  . Tobacco comment: Has Chantix  Substance Use Topics  . Alcohol use: Yes    Alcohol/week: 1.8 oz    Types: 3 Cans of beer per week  . Drug use: No    Allergies as of 01/25/2018  . (No Known Allergies)    Review of Systems:    All systems reviewed and negative except where noted in HPI.   Physical Exam:  BP 132/69   Pulse (!) 106   Temp 99 F (37.2 C) (Oral)   Ht 5\' 3"  (1.6 m)   Wt 117 lb 12.8 oz (53.4 kg)   BMI 20.87 kg/m  No LMP recorded.  General:    Alert,  Well-developed, well-nourished, pleasant and cooperative in NAD Head:  Normocephalic and atraumatic. Eyes:  Sclera clear, no icterus.   Conjunctiva pink. Ears:  Normal auditory acuity. Nose:  No deformity, discharge, or lesions. Mouth:  No deformity or lesions,oropharynx pink & moist. Neck:  Supple; no masses or thyromegaly. Lungs:  Respirations even and unlabored.  Clear throughout to auscultation.   No wheezes, crackles, or rhonchi. No acute distress. Heart:  Regular rate and rhythm; no murmurs, clicks, rubs, or gallops. Abdomen:  Normal bowel sounds. Soft, non-tender and non-distended without masses, hepatosplenomegaly or hernias noted.  No guarding or rebound tenderness.   Rectal: Not performed Msk:  Symmetrical without gross deformities. Good, equal movement & strength bilaterally. Pulses:  Normal pulses noted. Extremities:  No clubbing or edema.  No cyanosis. Neurologic:  Alert and oriented x3;  grossly normal neurologically. Skin:  Intact without significant lesions or rashes. No jaundice. Lymph Nodes:  No significant cervical adenopathy. Psych:  Alert and cooperative. Normal mood and affect.  Imaging Studies: No recent abdominal imaging  Assessment and Plan:   Mary Haney is a 47 y.o. African-American female with 2 month history of painless rectal bleeding. Her history is suggestive of outlet bleeding from hemorrhoids. However, given her ethnicity recommend colonoscopy for further evaluation.   - proceed with colonoscopy - Discuss with her about medical management of hemorrhoids and outpatient hemorrhoid ligation after the colonoscopy if first symptoms persist. Information provided  I have discussed alternative options, risks & benefits,  which include, but are not limited to, bleeding, infection, perforation,respiratory complication & drug reaction.  The patient agrees with this plan & written consent will be obtained.     Follow up in 2 months   Cephas Darby, MD

## 2018-01-28 ENCOUNTER — Telehealth: Payer: Self-pay | Admitting: Gastroenterology

## 2018-01-28 NOTE — Telephone Encounter (Signed)
Patient called in & has a procedure scheduled on 02-11-18 with Dr Marius Ditch and wanted to know the time. She was given the # [phone] the Endo unit to see if the could assist her.

## 2018-02-10 ENCOUNTER — Telehealth: Payer: Self-pay | Admitting: Gastroenterology

## 2018-02-10 ENCOUNTER — Telehealth: Payer: Self-pay | Admitting: Internal Medicine

## 2018-02-10 NOTE — Telephone Encounter (Signed)
Please advise 

## 2018-02-10 NOTE — Telephone Encounter (Signed)
Patient is have her colonospy this week and want to know what can she eat, being she has diabetes, please advise

## 2018-02-10 NOTE — Telephone Encounter (Signed)
LVM informing patient rx for bowel prep has been sent to [facility].  Asked pharmacist to contact patient when rx is ready for pick up. Thanks [employer]

## 2018-02-10 NOTE — Telephone Encounter (Signed)
Pt is calling she has a Colonoscopy scheduled for 02-11-18 states  Her rx is not at pharmacy  [facility]  [address] she states her insurnce coveres the following :[employer], [employer], [employer] and [employer] please call in one of those  That is the best, and call pt back

## 2018-02-11 ENCOUNTER — Ambulatory Visit: Payer: BLUE CROSS/BLUE SHIELD | Admitting: Anesthesiology

## 2018-02-11 ENCOUNTER — Encounter
Admission: RE | Disposition: A | Discharge status: Home / Self Care | Payer: Self-pay | Source: Ambulatory Visit | Attending: Gastroenterology

## 2018-02-11 ENCOUNTER — Ambulatory Visit
Admission: RE | Admit: 2018-02-11 | Discharge: 2018-02-11 | Disposition: A | Discharge status: Home / Self Care | Payer: BLUE CROSS/BLUE SHIELD | Source: Ambulatory Visit | Attending: Gastroenterology | Admitting: Gastroenterology

## 2018-02-11 DIAGNOSIS — Z79899 Other long term (current) drug therapy: Secondary | ICD-10-CM | POA: Diagnosis not present

## 2018-02-11 DIAGNOSIS — K625 Hemorrhage of anus and rectum: Secondary | ICD-10-CM | POA: Diagnosis not present

## 2018-02-11 DIAGNOSIS — J45909 Unspecified asthma, uncomplicated: Secondary | ICD-10-CM | POA: Insufficient documentation

## 2018-02-11 DIAGNOSIS — D122 Benign neoplasm of ascending colon: Secondary | ICD-10-CM | POA: Diagnosis not present

## 2018-02-11 DIAGNOSIS — Z794 Long term (current) use of insulin: Secondary | ICD-10-CM | POA: Diagnosis not present

## 2018-02-11 DIAGNOSIS — K641 Second degree hemorrhoids: Secondary | ICD-10-CM | POA: Insufficient documentation

## 2018-02-11 DIAGNOSIS — E119 Type 2 diabetes mellitus without complications: Secondary | ICD-10-CM | POA: Diagnosis not present

## 2018-02-11 DIAGNOSIS — D120 Benign neoplasm of cecum: Secondary | ICD-10-CM | POA: Diagnosis not present

## 2018-02-11 HISTORY — PX: COLONOSCOPY WITH PROPOFOL: SHX5780

## 2018-02-11 LAB — POCT PREGNANCY, URINE: Preg Test, Ur: NEGATIVE

## 2018-02-11 LAB — GLUCOSE, CAPILLARY: Glucose-Capillary: 173 mg/dL — ABNORMAL HIGH (ref 65–99)

## 2018-02-11 SURGERY — COLONOSCOPY WITH PROPOFOL
Anesthesia: General

## 2018-02-11 MED ORDER — PHENYLEPHRINE HCL 10 MG/ML IJ SOLN
INTRAMUSCULAR | Status: AC
  Filled 2018-02-11: qty 1

## 2018-02-11 MED ORDER — PHENYLEPHRINE HCL 10 MG/ML IJ SOLN
INTRAMUSCULAR | Status: DC | PRN
  Administered 2018-02-11: 50 ug via INTRAVENOUS

## 2018-02-11 MED ORDER — LIDOCAINE HCL (CARDIAC) 20 MG/ML IV SOLN
INTRAVENOUS | Status: DC | PRN
  Administered 2018-02-11: 40 mg via INTRAVENOUS

## 2018-02-11 MED ORDER — GLYCOPYRROLATE 0.2 MG/ML IJ SOLN
INTRAMUSCULAR | Status: AC
  Filled 2018-02-11: qty 1

## 2018-02-11 MED ORDER — PROPOFOL 500 MG/50ML IV EMUL
INTRAVENOUS | Status: DC | PRN
  Administered 2018-02-11: 140 ug/kg/min via INTRAVENOUS

## 2018-02-11 MED ORDER — PROPOFOL 500 MG/50ML IV EMUL
INTRAVENOUS | Status: AC
Start: 2018-02-11 — End: 2018-02-11
  Filled 2018-02-11: qty 50

## 2018-02-11 MED ORDER — LIDOCAINE HCL (PF) 2 % IJ SOLN
INTRAMUSCULAR | Status: AC
  Filled 2018-02-11: qty 10

## 2018-02-11 MED ORDER — SODIUM CHLORIDE 0.9 % IV SOLN
INTRAVENOUS | Status: DC
  Administered 2018-02-11: 1000 mL via INTRAVENOUS

## 2018-02-11 MED ORDER — PROPOFOL 500 MG/50ML IV EMUL
INTRAVENOUS | Status: AC
  Filled 2018-02-11: qty 150

## 2018-02-11 MED ORDER — GLYCOPYRROLATE 0.2 MG/ML IJ SOLN
INTRAMUSCULAR | Status: DC | PRN
  Administered 2018-02-11: 0.2 mg via INTRAVENOUS

## 2018-02-11 MED ORDER — PROPOFOL 10 MG/ML IV BOLUS
INTRAVENOUS | Status: DC | PRN
  Administered 2018-02-11: 20 mg via INTRAVENOUS
  Administered 2018-02-11: 30 mg via INTRAVENOUS
  Administered 2018-02-11: 20 mg via INTRAVENOUS
  Administered 2018-02-11: 30 mg via INTRAVENOUS
  Administered 2018-02-11: 70 mg via INTRAVENOUS
  Administered 2018-02-11 (×2): 30 mg via INTRAVENOUS

## 2018-02-11 MED ORDER — EPHEDRINE SULFATE 50 MG/ML IJ SOLN
INTRAMUSCULAR | Status: AC
  Filled 2018-02-11: qty 1

## 2018-02-11 NOTE — Transfer of Care (Signed)
Immediate Anesthesia Transfer of Care Note  Patient: Mary Haney  Procedure(s) Performed: COLONOSCOPY WITH PROPOFOL (N/A )  Patient Location: PACU  Anesthesia Type:General  Level of Consciousness: sedated  Airway & Oxygen Therapy: Patient Spontanous Breathing and Patient connected to nasal cannula oxygen  Post-op Assessment: Report given to RN and Post -op Vital signs reviewed and stable  Post vital signs: Reviewed and stable  Last Vitals:  Vitals:   02/11/18 0854 02/11/18 0857  BP: (!) 102/59 (!) 102/59  Pulse: 92 92  Resp: (!) 32 17  Temp: 36.5 C 36.5 C  SpO2: 100% 100%    Last Pain:  Vitals:   02/11/18 0854  TempSrc: Tympanic         Complications: No apparent anesthesia complications

## 2018-02-11 NOTE — Anesthesia Preprocedure Evaluation (Signed)
Anesthesia Evaluation  Patient identified by MRN, date of birth, ID band Patient awake    Reviewed: Allergy & Precautions, NPO status , Patient's Chart, lab work & pertinent test results  History of Anesthesia Complications Negative for: history of anesthetic complications  Airway Mallampati: II  TM Distance: >3 FB Neck ROM: Full    Dental no notable dental hx.    Pulmonary asthma , Current Smoker,    breath sounds clear to auscultation- rhonchi (-) wheezing      Cardiovascular Exercise Tolerance: Good (-) hypertension(-) CAD, (-) Past MI, (-) Cardiac Stents and (-) CABG  Rhythm:Regular Rate:Normal - Systolic murmurs and - Diastolic murmurs    Neuro/Psych negative neurological ROS  negative psych ROS   GI/Hepatic negative GI ROS, Neg liver ROS,   Endo/Other  diabetes, Insulin Dependent  Renal/GU negative Renal ROS     Musculoskeletal negative musculoskeletal ROS (+)   Abdominal (+) - obese,   Peds  Hematology negative hematology ROS (+)   Anesthesia Other Findings Past Medical History: No date: Alcoholic pancreatitis No date: Asthma     Comment:  Has albuterol inhaler No date: Diabetes mellitus without complication ([facility])     Comment:  Type 2 diagnosed 2013   Reproductive/Obstetrics                             Anesthesia Physical Anesthesia Plan  ASA: II  Anesthesia Plan: General   Post-op Pain Management:    Induction: Intravenous  PONV Risk Score and Plan: 1 and Propofol infusion  Airway Management Planned: Natural Airway  Additional Equipment:   Intra-op Plan:   Post-operative Plan:   Informed Consent: I have reviewed the patients History and Physical, chart, labs and discussed the procedure including the risks, benefits and alternatives for the proposed anesthesia with the patient or authorized representative who has indicated his/her understanding and acceptance.    Dental advisory given  Plan Discussed with: CRNA and Anesthesiologist  Anesthesia Plan Comments:         Anesthesia Quick Evaluation

## 2018-02-11 NOTE — Anesthesia Postprocedure Evaluation (Signed)
Anesthesia Post Note  Patient: Mary Haney  Procedure(s) Performed: COLONOSCOPY WITH PROPOFOL (N/A )  Patient location during evaluation: Endoscopy Anesthesia Type: General Level of consciousness: awake and alert and oriented Pain management: pain level controlled Vital Signs Assessment: post-procedure vital signs reviewed and stable Respiratory status: spontaneous breathing, nonlabored ventilation and respiratory function stable Cardiovascular status: blood pressure returned to baseline and stable Postop Assessment: no signs of nausea or vomiting Anesthetic complications: no     Last Vitals:  Vitals:   02/11/18 0914 02/11/18 0924  BP: 138/89 (!) 133/94  Pulse: 86 81  Resp: 19 (!) 21  Temp:    SpO2: 100% 100%    Last Pain:  Vitals:   02/11/18 0854  TempSrc: Tympanic                 Stanlee Roehrig

## 2018-02-11 NOTE — Telephone Encounter (Signed)
  I actually got this message after she had a colonoscopy today.  This was actually a question for her GI doctor.  They usually give her a list of foods that she can use.Marland KitchenMarland Kitchen

## 2018-02-11 NOTE — Anesthesia Post-op Follow-up Note (Signed)
Anesthesia QCDR form completed.        

## 2018-02-11 NOTE — H&P (Signed)
Cephas Darby, MD [address]  [place]  [place], [place] [address]  Main: [phone]  Fax: [phone] Pager: [phone]  Primary Care Physician:  Burnard Hawthorne, FNP Primary Gastroenterologist:  Dr. Cephas Darby  Pre-Procedure History & Physical: HPI:  Mary Haney is a 47 y.o. female is here for an colonoscopy.   Past Medical History:  Diagnosis Date  . Alcoholic pancreatitis   . Asthma    Has albuterol inhaler  . Diabetes mellitus without complication ([place])    Type 2 diagnosed 2013    Past Surgical History:  Procedure Laterality Date  . NO PAST SURGERIES      Prior to Admission medications   Medication Sig Start Date End Date Taking? Authorizing Provider  albuterol (PROAIR HFA) 108 (90 Base) MCG/ACT inhaler Inhale 2 puffs into the lungs every 6 (six) hours as needed for wheezing or shortness of breath. 07/10/16  Yes Cook, Jayce G, DO  cetirizine (ZYRTEC) 10 MG tablet Take 10 mg by mouth daily as needed for allergies.    Yes [provider]  Continuous Blood Gluc Receiver (FREESTYLE LIBRE READER) DEVI 1 Device by Does not apply route 3 (three) times daily. 09/15/17  Yes Philemon Kingdom, MD  Continuous Blood Gluc Sensor (FREESTYLE LIBRE SENSOR SYSTEM) MISC 1 Device by Does not apply route every 30 (thirty) days. 09/15/17  Yes Philemon Kingdom, MD  hydrocortisone (ANUSOL-HC) 25 MG suppository Place 1 suppository (25 mg total) rectally 2 (two) times daily. 08/18/17  Yes Arnett, Yvetta Coder, FNP  Insulin Pen Needle 31G X 5 MM MISC Use when taking insulin. 08/07/16  Yes Cook, Jayce G, DO  Insulin Syringe-Needle U-100 (INSULIN SYRINGE .5CC/30GX5/16") 30G X 5/16" 0.5 ML MISC Use as directed when taking insulin. 12/10/16  Yes Cook, Jayce G, DO  LANTUS 100 UNIT/ML injection INJECT 20 UNITS INTO THE SKIN AT BEDTIME 11/23/17  Yes Crecencio Mc, MD  NOVOLOG FLEXPEN 100 UNIT/ML FlexPen INJECT 3 UNITS INTO THE SKIN 3 (THREE) TIMES DAILY WITH MEALS. PER SLIDING  SCALE 06/15/17  Yes Cook, Jayce G, DO  triamcinolone ointment (KENALOG) 0.1 % Apply 1 application topically 2 (two) times daily. 01/15/17  Yes Cook, Jayce G, DO  varenicline (CHANTIX STARTING MONTH PAK) 0.5 MG X 11 & 1 MG X 42 tablet Take one 0.5 mg tablet by mouth once daily for 3 days, then increase to one 0.5 mg tablet twice daily for 4 days, then increase to one 1 mg tablet twice daily. 12/11/17  Yes Leone Haven, MD    Allergies as of 01/25/2018  . (No Known Allergies)    Family History  Problem Relation Age of Onset  . Diabetes Father   . Depression Paternal Grandmother   . Depression Paternal Grandfather   . Depression Cousin     Social History   Socioeconomic History  . Marital status: Single    Spouse name: Not on file  . Number of children: 0  . Years of education: 52  . Highest education level: Not on file  Social Needs  . Financial resource strain: Not on file  . Food insecurity - worry: Not on file  . Food insecurity - inability: Not on file  . Transportation needs - medical: Not on file  . Transportation needs - non-medical: Not on file  Occupational History  . Occupation: Probation officer: [employer]    Comment: [place]  Tobacco Use  . Smoking status: Current Every Day  Smoker    Packs/day: 0.50    Years: 25.00    Pack years: 12.50    Types: Cigarettes  . Smokeless tobacco: Never Used  . Tobacco comment: Has Chantix  Substance and Sexual Activity  . Alcohol use: Yes    Alcohol/week: 1.8 oz    Types: 3 Cans of beer per week  . Drug use: No  . Sexual activity: Yes    Birth control/protection: Condom  Other Topics Concern  . Not on file  Social History Narrative   Dannon was born in [place], [place]. She moved with her family to [place] at age 35. She attended 2 years at [employer] and was majoring in [place]. Her goal is to become a Optometrist in the [employer]. 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 Neil lives with her boyfriend, [name]. They are considering getting married soon. She enjoys sketching outdoor scenery, cartoons and loves to read.    Review of Systems: See HPI, otherwise negative ROS  Physical Exam: There were no vitals taken for this visit. General:   Alert,  pleasant and cooperative in NAD Head:  Normocephalic and atraumatic. Neck:  Supple; no masses or thyromegaly. Lungs:  Clear throughout to auscultation.    Heart:  Regular rate and rhythm. Abdomen:  Soft, nontender and nondistended. Normal bowel sounds, without guarding, and without rebound.   Neurologic:  Alert and  oriented x4;  grossly normal neurologically.  Impression/Plan: Mary Haney is here for an colonoscopy to be performed for rectal bleeding  Risks, benefits, limitations, and alternatives regarding  colonoscopy have been reviewed with the patient.  Questions have been answered.  All parties agreeable.   Sherri Sear, MD  02/11/2018, 7:59 AM

## 2018-02-11 NOTE — Op Note (Signed)
[facility] Patient Name: Mary Haney Procedure Date: 02/11/2018 8:20 AM MRN: [id number] Account #: [id number] Date of Birth: 26-Aug-1971 Admit Type: Outpatient Age: 47 Room: [facility] ENDO ROOM 2 Gender: Female Note Status: Finalized Procedure:            Colonoscopy Indications:          Rectal bleeding Providers:            Lin Landsman MD, MD Referring MD:         Yvetta Coder. Arnett (Referring MD) Medicines:            Monitored Anesthesia Care Complications:        No immediate complications. Estimated blood loss:                        Minimal. Procedure:            Pre-Anesthesia Assessment:                       - Prior to the procedure, a History and Physical was                        performed, and patient medications and allergies were                        reviewed. The patient is competent. The risks and                        benefits of the procedure and the sedation options and                        risks were discussed with the patient. All questions                        were answered and informed consent was obtained.                        Patient identification and proposed procedure were                        verified by the physician, the nurse, the                        anesthesiologist, the anesthetist and the technician in                        the pre-procedure area in the procedure room in the                        endoscopy suite. Mental Status Examination: alert and                        oriented. Airway Examination: normal oropharyngeal                        airway and neck mobility. Respiratory Examination:                        clear to auscultation. CV Examination: normal.  Prophylactic Antibiotics: The patient does not require                        prophylactic antibiotics. Prior Anticoagulants: The                        patient has taken no previous anticoagulant or                         antiplatelet agents. ASA Grade Assessment: III - A                        patient with severe systemic disease. After reviewing                        the risks and benefits, the patient was deemed in                        satisfactory condition to undergo the procedure. The                        anesthesia plan was to use monitored anesthesia care                        (MAC). Immediately prior to administration of                        medications, the patient was re-assessed for adequacy                        to receive sedatives. The heart rate, respiratory rate,                        oxygen saturations, blood pressure, adequacy of                        pulmonary ventilation, and response to care were                        monitored throughout the procedure. The physical status                        of the patient was re-assessed after the procedure.                       After obtaining informed consent, the colonoscope was                        passed under direct vision. Throughout the procedure,                        the patient's blood pressure, pulse, and oxygen                        saturations were monitored continuously. The                        Colonoscope was introduced through the anus and  advanced to the the cecum, identified by appendiceal                        orifice and ileocecal valve. The colonoscopy was                        technically difficult and complex due to a tortuous                        colon. Successful completion of the procedure was aided                        by applying abdominal pressure. The patient tolerated                        the procedure fairly well. The quality of the bowel                        preparation was evaluated using the BBPS [facility] Bowel                        Preparation Scale) with scores of: Right Colon = 3,                        Transverse Colon = 3 and Left Colon = 3  (entire mucosa                        seen well with no residual staining, small fragments of                        stool or opaque liquid). The total BBPS score equals 9. Findings:      The digital rectal exam findings include internal hemorrhoids that       prolapse with straining, but spontaneously regress to the resting       position (Grade II). Pertinent negatives include normal sphincter tone       and no palpable rectal lesions.      A 6 mm polyp was found in the ileocecal valve. The polyp was sessile.       The polyp was removed with a cold snare. Resection and retrieval were       complete.      Two sessile polyps were found in the ascending colon. The polyps were       diminutive in size. These polyps were removed with a cold biopsy       forceps. Resection and retrieval were complete.      A 6 mm polyp was found in the sigmoid colon. The polyp was sessile. The       polyp was removed with a cold snare. Resection and retrieval were       complete.      Non-bleeding external internal hemorrhoids were found during       retroflexion. The hemorrhoids were large.      The exam was otherwise without abnormality. Impression:           - Internal hemorrhoids that prolapse with straining,                        but spontaneously regress to the resting position                        (  Grade II) found on digital rectal exam.                       - One 6 mm polyp at the ileocecal valve, removed with a                        cold snare. Resected and retrieved.                       - Two diminutive polyps in the ascending colon, removed                        with a cold biopsy forceps. Resected and retrieved.                       - One 6 mm polyp in the sigmoid colon, removed with a                        cold snare. Resected and retrieved.                       - Non-bleeding external internal hemorrhoids.                       - The examination was otherwise  normal. Recommendation:       - Discharge patient to home.                       - Resume previous diet today.                       - Continue present medications.                       - Await pathology results.                       - Repeat colonoscopy in 3 years for surveillance of                        multiple polyps.                       - Return to my office as previously scheduled. Procedure Code(s):    --- Professional ---                       [id number], Colonoscopy, flexible; with removal of tumor(s),                        polyp(s), or other lesion(s) by snare technique                       45380, 66, Colonoscopy, flexible; with biopsy, single                        or multiple Diagnosis Code(s):    --- Professional ---                       D12.0, Benign neoplasm of cecum  D12.5, Benign neoplasm of sigmoid colon                       D12.2, Benign neoplasm of ascending colon                       K64.1, Second degree hemorrhoids                       K64.4, Residual hemorrhoidal skin tags                       K62.5, Hemorrhage of anus and rectum CPT copyright 2016 American Medical Association. All rights reserved. The codes documented in this report are preliminary and upon coder review may  be revised to meet current compliance requirements. Dr. Ulyess Mort Lin Landsman MD, MD 02/11/2018 8:55:25 AM This report has been signed electronically. Number of Addenda: 0 Note Initiated On: 02/11/2018 8:20 AM Scope Withdrawal Time: 0 hours 19 minutes 8 seconds  Total Procedure Duration: 0 hours 24 minutes 3 seconds       [facility]

## 2018-02-11 NOTE — Telephone Encounter (Signed)
Spoke to patient. Gave instructions. Patient verbalized understanding.

## 2018-02-12 ENCOUNTER — Encounter: Payer: Self-pay | Admitting: Gastroenterology

## 2018-02-12 ENCOUNTER — Telehealth: Payer: Self-pay | Admitting: Gastroenterology

## 2018-02-12 LAB — SURGICAL PATHOLOGY

## 2018-02-12 NOTE — Telephone Encounter (Signed)
LVM for patient to call and schedule for the banding clinic in 1-2 weeks per Dr. Marius Ditch.

## 2018-02-15 ENCOUNTER — Encounter: Payer: Self-pay | Admitting: Gastroenterology

## 2018-02-19 ENCOUNTER — Encounter: Payer: Self-pay | Admitting: Gastroenterology

## 2018-02-19 ENCOUNTER — Ambulatory Visit: Payer: BLUE CROSS/BLUE SHIELD | Admitting: Gastroenterology

## 2018-02-19 VITALS — BP 118/75 | HR 103 | Temp 98.3°F | Ht 63.0 in | Wt 115.3 lb

## 2018-02-19 DIAGNOSIS — K625 Hemorrhage of anus and rectum: Secondary | ICD-10-CM

## 2018-02-22 ENCOUNTER — Telehealth: Payer: Self-pay | Admitting: Gastroenterology

## 2018-02-22 NOTE — Telephone Encounter (Signed)
Pt left vm to call her back, pt did not answer so I left her a vm to call us back

## 2018-03-05 ENCOUNTER — Ambulatory Visit: Payer: Self-pay | Admitting: *Deleted

## 2018-03-05 NOTE — Telephone Encounter (Signed)
  Reason for Disposition . Health Information question, no triage required and triager able to answer question  Answer Assessment - Initial Assessment Questions 1. REASON FOR CALL or QUESTION: "What is your reason for calling today?" or "How can I best help you?" or "What question do you have that I can help answer?"      Pt had bought a foot bath and had questions regarding how to use it. There is a caution noted on the foot bath that if you are a diabetic, you should not use the bath. She stated that she had been diagnosed with being a diabetic 3 years ago.  Pt cautioned that diabetics can lose the ability to feel in their feet. So may not be able to feel the temperature of the water, being too hot. Also if the bath is not kept clean, could possibly pick up an infection and it is harder to clear up an infection for a diabetic. Also diabetics need to keep their feet clean and dry. Dry your feet very well after a bath and make sure that you dry between your toes. Pt voiced understanding.  Protocols used: INFORMATION ONLY CALL-A-AH

## 2018-03-11 ENCOUNTER — Ambulatory Visit: Payer: BLUE CROSS/BLUE SHIELD | Admitting: Family

## 2018-03-11 LAB — HM DIABETES EYE EXAM

## 2018-03-12 ENCOUNTER — Encounter: Payer: Self-pay | Admitting: Family

## 2018-03-15 ENCOUNTER — Other Ambulatory Visit: Payer: Self-pay | Admitting: Family Medicine

## 2018-03-18 ENCOUNTER — Encounter: Payer: Self-pay | Admitting: Internal Medicine

## 2018-03-18 ENCOUNTER — Ambulatory Visit: Payer: BLUE CROSS/BLUE SHIELD | Admitting: Internal Medicine

## 2018-03-18 VITALS — BP 144/82 | HR 99 | Ht 63.0 in | Wt 116.8 lb

## 2018-03-18 DIAGNOSIS — E1065 Type 1 diabetes mellitus with hyperglycemia: Secondary | ICD-10-CM | POA: Diagnosis not present

## 2018-03-18 LAB — POCT GLYCOSYLATED HEMOGLOBIN (HGB A1C): Hemoglobin A1C: 7.7

## 2018-03-18 MED ORDER — GLUCAGON (RDNA) 1 MG IJ KIT: 1.0000 mg | PACK | Freq: Once | INTRAMUSCULAR | 12 refills | Status: DC | PRN

## 2018-03-18 NOTE — Progress Notes (Signed)
Patient ID: Mary Haney, female   DOB: [birth year], 47 y.o.   MRN: [id number]  HPI: Mary Haney is a 47 y.o.-year-old female, returning for f/u for DM, dx 2013, insulin-dependent, uncontrolled, without long term complications. Last visit was 3 mo ago. PCP: Dr. Caryl Haney  Reviewed and addended hx: In 07/2012 she went to the ED because she was not feeling well. She was found to have a blood glucose level of 600 + and was admitted with DKA. She was followed by Dr. Gabriel Haney immediately following initial diagnosis, then switched to [facility] endocrinology.   In 10/2013, due to lack of finances, she was stretching the insulin doses >> sugars high >> low CBG 25-26 >> ED.    She was admitted in 04/2016 for DKA.  At the end of 2017, she met the DM educator and nutritionist >> decided for Omnipod pump >> but nobody called her back >> this year cannot afford it.  She decided against an insulin pump.  She is on freestyle libre CGM.  She loves it.  She is active at work >> sometimes misses the insulin with lunch.  Reviewed HbA1c levels: Lab Results  Component Value Date   HGBA1C 7.9 12/17/2017   HGBA1C 7.9 09/15/2017   HGBA1C 8.1 04/02/2017   Pt is on a regimen of: - Lantus 8 units in am and 7 units at bedtime She could not afford Tresiba - Novolog:  Snack: 1-2 units B'fast: 3-4 units Lunch: 3-4 units Dinner: 5 units   Pt checks her sugars 2-3x a day: - am:  68, 83-130, 173, 200 >> 170-180 >> 50s, 160-182, 200  - 2h after b'fast: (1 unit- snack) >> 41-401 >> n/c - before lunch: (3-4 units) 110-120s >> 80-100 >> 87-110 - 2h after lunch: n/c >> 378, 395 >> n/c - before dinner:   130s >> 110-120s >> 160-170 >> 120-130 - 2h after dinner:  32 x1, 179, 415 >> n/c - bedtime: 120-170 >> 120-130s >> 160-170 >> 160-175, 180 - nighttime: n/c Lowest sugar was 38 x1 >> 32 after dinner x1, 40 in am >> 3 am: 51 >> 68 >> 70 >> 37 (very busy, forgot to eat) ; she has hypoglycemia awareness in the  60s Highest sugar was 358 >> 308 >> 200s.  Has ReliOn meter.  Pt's meals are: - Breakfast: oatmeal, yoghurt, sausage bisquit - Lunch: salad, soup, sandwiches - Dinner: meat + starch + vegetables - Snacks: yoghurt, peanut crackers, nuts  - No CKD: Lab Results  Component Value Date   BUN 10 09/15/2017   Lab Results  Component Value Date   CREATININE 0.68 09/15/2017   - No HL: Lab Results  Component Value Date   CHOL 157 09/15/2017   HDL 85.70 09/15/2017   LDLCALC 53 09/15/2017   TRIG 95.0 09/15/2017   CHOLHDL 2 09/15/2017   - last eye exam was in 03/2018 >> No DR - no numbness and tingling in her feet  She has a palpable thyroid.  Last TSH normal: Lab Results  Component Value Date   TSH 0.89 09/15/2017   Pt denies: - feeling nodules in neck - hoarseness - dysphagia - choking - SOB with lying down  ROS: Constitutional: no weight gain/no weight loss, no fatigue, no subjective hyperthermia, no subjective hypothermia Eyes: no blurry vision, no xerophthalmia ENT: no sore throat, + see HPI Cardiovascular: no CP/no SOB/no palpitations/no leg swelling Respiratory: no cough/no SOB/no wheezing Gastrointestinal: no N/no V/no D/no C/no acid reflux Musculoskeletal: no muscle aches/no  joint aches Skin: no rashes, no hair loss Neurological: no tremors/no numbness/no tingling/no dizziness  I reviewed pt's medications, allergies, PMH, social hx, family hx, and changes were documented in the history of present illness. Otherwise, unchanged from my initial visit note.  Past Medical History:  Diagnosis Date  . Alcoholic pancreatitis   . Asthma    Has albuterol inhaler  . Diabetes mellitus without complication ([place])    Type 2 diagnosed 2013   Past Surgical History:  Procedure Laterality Date  . COLONOSCOPY WITH PROPOFOL N/A 02/11/2018   Procedure: COLONOSCOPY WITH PROPOFOL;  Surgeon: Mary Landsman, MD;  Location: [facility] ENDOSCOPY;  Service: Gastroenterology;   Laterality: N/A;  . NO PAST SURGERIES     History   Social History  . Marital Status: Single    Spouse Name: N/A    Number of Children: 0  . Years of Education: 14   Occupational History  . Take Out Specialist     [place] [employer]   Social History Main Topics  . Smoking status: Current Every Day Smoker -- 0.50 packs/day for 25 years    Types: Cigarettes  . Smokeless tobacco: Never Used  . Alcohol Use: 1.8 oz/week    3 Cans of beer per week  . Drug Use: No  . Sexual Activity: Yes    Birth Control/ Protection: Condom   Social History Narrative   Mary Haney was born in [place], [place]. She moved with her family to [place] at age 89. She attended 2 years at [employer] and was majoring in [place]. Her goal is to become a Optometrist in the [employer]. 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 Mary Haney lives with her boyfriend, Mary Haney. They are considering getting married soon. She enjoys sketching outdoor scenery, cartoons and loves to read.   Current Outpatient Medications on File Prior to Visit  Medication Sig Dispense Refill  . albuterol (PROAIR HFA) 108 (90 Base) MCG/ACT inhaler Inhale 2 puffs into the lungs every 6 (six) hours as needed for wheezing or shortness of breath. 1 Inhaler 1  . cetirizine (ZYRTEC) 10 MG tablet Take 10 mg by mouth daily as needed for allergies.     . Continuous Blood Gluc Receiver (FREESTYLE LIBRE READER) DEVI 1 Device by Does not apply route 3 (three) times daily. 1 Device 1  . Continuous Blood Gluc Sensor (FREESTYLE LIBRE SENSOR SYSTEM) MISC 1 Device by Does not apply route every 30 (thirty) days. 3 each 11  . hydrocortisone (ANUSOL-HC) 25 MG suppository Place 1 suppository (25 mg total) rectally 2 (two) times daily. 12 suppository 0  . Insulin Pen Needle 31G X 5 MM MISC Use when taking insulin. 100 each 11  . Insulin Syringe-Needle U-100 (INSULIN SYRINGE .5CC/30GX5/16") 30G X 5/16" 0.5 ML MISC Use as directed when taking  insulin. 100 each 11  . LANTUS 100 UNIT/ML injection INJECT 20 UNITS INTO THE SKIN AT BEDTIME 10 mL 1  . NOVOLOG FLEXPEN 100 UNIT/ML FlexPen INJECT 3 UNITS INTO THE SKIN 3 (THREE) TIMES DAILY WITH MEALS. PER SLIDING SCALE 15 pen 1  . triamcinolone ointment (KENALOG) 0.1 % Apply 1 application topically 2 (two) times daily. 80 g 3  . varenicline (CHANTIX STARTING MONTH PAK) 0.5 MG X 11 & 1 MG X 42 tablet Take one 0.5 mg tablet by mouth once daily for 3 days, then increase to one 0.5 mg tablet twice daily for 4 days, then increase to one 1 mg tablet twice  daily. 53 tablet 0   No current facility-administered medications on file prior to visit.    No Known Allergies Family History  Problem Relation Age of Onset  . Diabetes Father   . Depression Paternal Grandmother   . Depression Paternal Grandfather   . Depression Cousin    PE: BP (!) 144/82   Pulse 99   Ht '5\' 3"'  (1.6 m)   Wt 116 lb 12.8 oz (53 kg)   SpO2 99%   BMI 20.69 kg/m  Wt Readings from Last 3 Encounters:  03/18/18 116 lb 12.8 oz (53 kg)  02/19/18 115 lb 4.8 oz (52.3 kg)  02/11/18 117 lb (53.1 kg)   Constitutional: normal weight, in NAD Eyes: PERRLA, EOMI, no exophthalmos ENT: moist mucous membranes, + palpable thyroid, no cervical lymphadenopathy Cardiovascular: tachycardia, RR, No MRG Respiratory: CTA B Gastrointestinal: abdomen soft, NT, ND, BS+ Musculoskeletal: no deformities, strength intact in all 4 Skin: moist, warm, no rashes Neurological: no tremor with outstretched hands, DTR normal in all 4  ASSESSMENT: 1. DM, insulin-dependent, uncontrolled, with complications - hyper- and hypo-glycemia - DKA  Component     Latest Ref Rng & Units 11/04/2016  Hemoglobin A1C      9.0  C-Peptide     0.80 - 3.85 ng/mL <0.10 (L)  Glucose, Fasting     65 - 99 mg/dL 40 (L)  Glutamic Acid Decarb Ab     <5 IU/mL >250 (H)  Pancreatic Islet Cell Antibody     <5 JDF Units <5   Glucose very low at the time of the blood  draw, therefore, C-peptide is not interpretable. However, GAD antibodies are undetectably high, confirming type 1 diabetes.  2.  Palpable thyroid  PLAN:  1. Patient with type 1 diabetes, on basal-bolus insulin regimen, with slightly worse control at last visit after the holidays.  At last visit, sugars were higher in the morning and lower at noon, we increased the Lantus dose at bedtime and decrease the dose in a.m.  I also increased her insulin with lunch as sugars were also higher before dinner. -She continues on the freestyle libre CGM - off now b/c pain at the site, but will restart -We tried to use [place] but this was not covered by insurance, so she continues on Lantus twice a day. -We had her on a sliding scale before but she is not using it - sugars in am are still high, but they are better in the rest of the day. After dinner, they are 160-180 >> I advised her to increase Novolog with dinner to 6 units. Would not want to increase Lantus 2/2 the few 50s she had in am (? Reason) -  I suggested to:  Patient Instructions  Please continue: - Lantus 8 units in am and 7 units at bedtime  Please increase: - Novolog:  Snack: 1-2 units B'fast: 3-4 units Lunch: 3-4 units Dinner: 5 >> 6 units  Please come back for a follow-up appointment in 3 months.  - today, HbA1c is 7.7% (slightly better) - continue checking sugars at different times of the day - check 3x a day, rotating checks - advised for yearly eye exams >> she is UTD - Return to clinic in 3 mo with sugar log   2.  Palpable thyroid - symmetric, nonnodular - no neck compression sxs - most recent TSH normal - will continue to follow her clinically   Philemon Kingdom, MD PhD [facility]

## 2018-03-18 NOTE — Patient Instructions (Signed)
Please continue: - Lantus 8 units in am and 7 units at bedtime  Please increase: - Novolog:  Snack: 1-2 units B'fast: 3-4 units Lunch: 3-4 units Dinner: 5 >> 6 units  Please come back for a follow-up appointment in 3 months.

## 2018-03-19 ENCOUNTER — Other Ambulatory Visit: Payer: Self-pay | Admitting: Family Medicine

## 2018-03-19 NOTE — Telephone Encounter (Signed)
OK to refill

## 2018-03-19 NOTE — Telephone Encounter (Signed)
Call pt Need clarify- why is she on high dose kenalog? She is requesting refill  I want to ensure she is aware to use sparingly as can cause discoloration of skin, permanent hypopigmentation.

## 2018-03-24 NOTE — Telephone Encounter (Signed)
°  Relation to pt: self Call back number:[phone] Pharmacy: [id number] - [place], [place] [address] [phone] (Phone) [phone] (Fax)       Reason for call:  Patient checking on the status of triamcinolone ointment (KENALOG) 0.1 % request, patient states she uses the cream more for her hair scalp due to the grease in her hair, patient states she's completely out, please advise

## 2018-03-25 NOTE — Telephone Encounter (Signed)
This is not my patient   tMS

## 2018-03-29 ENCOUNTER — Other Ambulatory Visit: Payer: Self-pay | Admitting: Family

## 2018-03-29 MED ORDER — TRIAMCINOLONE ACETONIDE 0.1 % EX OINT: 1.0000 "application " | TOPICAL_OINTMENT | Freq: Two times a day (BID) | CUTANEOUS | 0 refills | Status: DC

## 2018-03-29 NOTE — Telephone Encounter (Signed)
Call pt  I have not yet met patient; she needs an appt to establish care  Sounds like she uses steriod cream for contact dermatitis?  I would advise VERY careful use as can cause permanent hyopigmention.  I refilled

## 2018-03-30 NOTE — Telephone Encounter (Signed)
Patient advised, Appointment scheduled.  

## 2018-04-29 ENCOUNTER — Other Ambulatory Visit: Payer: Self-pay | Admitting: Family Medicine

## 2018-05-04 ENCOUNTER — Other Ambulatory Visit: Payer: Self-pay | Admitting: Family Medicine

## 2018-05-05 ENCOUNTER — Encounter: Payer: Self-pay | Admitting: Family

## 2018-05-05 ENCOUNTER — Other Ambulatory Visit (HOSPITAL_COMMUNITY)
Admission: RE | Admit: 2018-05-05 | Discharge: 2018-05-05 | Disposition: A | Discharge status: Home / Self Care | Payer: BLUE CROSS/BLUE SHIELD | Source: Ambulatory Visit | Attending: Family | Admitting: Family

## 2018-05-05 ENCOUNTER — Ambulatory Visit: Payer: BLUE CROSS/BLUE SHIELD | Admitting: Family

## 2018-05-05 VITALS — BP 124/70 | HR 97 | Temp 97.4°F | Resp 16 | Wt 123.0 lb

## 2018-05-05 DIAGNOSIS — F17210 Nicotine dependence, cigarettes, uncomplicated: Secondary | ICD-10-CM

## 2018-05-05 DIAGNOSIS — L309 Dermatitis, unspecified: Secondary | ICD-10-CM | POA: Diagnosis not present

## 2018-05-05 DIAGNOSIS — B9689 Other specified bacterial agents as the cause of diseases classified elsewhere: Secondary | ICD-10-CM | POA: Diagnosis not present

## 2018-05-05 DIAGNOSIS — B373 Candidiasis of vulva and vagina: Secondary | ICD-10-CM | POA: Diagnosis not present

## 2018-05-05 DIAGNOSIS — Z1231 Encounter for screening mammogram for malignant neoplasm of breast: Secondary | ICD-10-CM | POA: Diagnosis not present

## 2018-05-05 DIAGNOSIS — B379 Candidiasis, unspecified: Secondary | ICD-10-CM

## 2018-05-05 DIAGNOSIS — Z1239 Encounter for other screening for malignant neoplasm of breast: Secondary | ICD-10-CM

## 2018-05-05 MED ORDER — VARENICLINE TARTRATE 0.5 MG X 11 & 1 MG X 42 PO MISC: ORAL | 0 refills | Status: DC

## 2018-05-05 MED ORDER — TRIAMCINOLONE ACETONIDE 0.1 % EX OINT: 1.0000 "application " | TOPICAL_OINTMENT | Freq: Two times a day (BID) | CUTANEOUS | 2 refills | Status: DC

## 2018-05-05 MED ORDER — FLUCONAZOLE 150 MG PO TABS: ORAL_TABLET | ORAL | 3 refills | Status: DC

## 2018-05-05 NOTE — Patient Instructions (Addendum)
  Let me know if yeast infection doesn't improve   The below is how you take Chantix.   Select a quit date within 7 days of starting Chantix. Goal is you should stop smoking within 8 to 35 days of starting chantix  Initial: Days 1 to 3: 0.5 mg by mouth once daily Days 4 to 7: 0.5 mg by mouth twice daily   Maintenance (? Day 8): 1 mg by mouth twice daily for 11 weeks. We may consider a temporary or permanent dose reduction if usual dose of 1 mg twice per day is not tolerated.  If you are not able or willing to quit abruptly, begin treatment with vareniciline ( Chantix) and reduce smoking by 50% from baseline within the first 4 weeks, by an additional 50% in the next 4 weeks, and continue reducing with the goal of complete abstinence by 12 weeks. If successfully quits smoking at the end of the 12 weeks, may continue for another 12 weeks to help maintain success. If you are motivated to quit and do not succeed in stopping smoking during prior therapy, or relapse after treatment, I would encourage you to make another attempt with varenicline ( Chantix) once factors contributing to the failed attempt have been identified and addressed.  We placed a referral for mammogram this year. I asked that you call one the below locations and schedule this when it is convenient for you.   As discussed, I would like you to ask for 3D mammogram over the traditional 2D mammogram as new evidence suggest 3D is superior.   Please note that NOT all insurance companies cover 3D and you may have to pay a higher copay. You may call your insurance company to further clarify your benefits.   Options for [place]  [place], [place]  * [facility] [place] [place] * Note if you ask for 3D mammogram at this location, you must request [place], [place] location*

## 2018-05-05 NOTE — Progress Notes (Signed)
Subjective:    Patient ID: Mary Haney, female    DOB: 11/30/1971, 47 y.o.   MRN: [id number]  CC: Mary Haney is a 47 y.o. female who presents today to establish care.    HPI: CC: vaginal itching x1 week.  States it always happens in the warmer months.  Describes more discharge than usual.  No pelvic pain.  No dysuria, fever  Needs refill of triamcinolone which she uses prn for eczema.  She would also like a refill of Chantix as never started medication.  Denies any history of depression. No hi/si.   No concerns for pregnancy, STDs.   Recently treated for rash on right breast 2 weeks, largely resolved. Given valtrex , bactroban.  DM I - Dr Cruzita Lederer  Rectal bleeding- Dr Marius Ditch-  colonoscpoy showed hemorrhoids, polyps; repeat in 3 years HISTORY:  Past Medical History:  Diagnosis Date  . Alcoholic pancreatitis   . Asthma    Has albuterol inhaler  . Diabetes mellitus without complication ([place])    Type 2 diagnosed 2013   Past Surgical History:  Procedure Laterality Date  . COLONOSCOPY WITH PROPOFOL N/A 02/11/2018   Procedure: COLONOSCOPY WITH PROPOFOL;  Surgeon: Lin Landsman, MD;  [facility];  Service: Gastroenterology;  Laterality: N/A;  . NO PAST SURGERIES     Family History  Problem Relation Age of Onset  . Diabetes Father   . Depression Paternal Grandmother   . Depression Paternal Grandfather   . Depression Cousin     Allergies: Patient has no known allergies. Current Outpatient Medications on File Prior to Visit  Medication Sig Dispense Refill  . albuterol (PROAIR HFA) 108 (90 Base) MCG/ACT inhaler Inhale 2 puffs into the lungs every 6 (six) hours as needed for wheezing or shortness of breath. 1 Inhaler 1  . BD INSULIN SYRINGE U/F 30G X 1/2" 0.5 ML MISC USE AS DIRECTED WHEN TAKING INSULIN 100 each 3  . cetirizine (ZYRTEC) 10 MG tablet Take 10 mg by mouth daily as needed for allergies.     . Continuous Blood Gluc Receiver (FREESTYLE LIBRE READER)  DEVI 1 Device by Does not apply route 3 (three) times daily. 1 Device 1  . Continuous Blood Gluc Sensor (FREESTYLE LIBRE SENSOR SYSTEM) MISC 1 Device by Does not apply route every 30 (thirty) days. 3 each 11  . glucagon (GLUCAGON EMERGENCY) 1 MG injection Inject 1 mg into the muscle once as needed for up to 1 dose. 1 each 12  . hydrocortisone (ANUSOL-HC) 25 MG suppository Place 1 suppository (25 mg total) rectally 2 (two) times daily. 12 suppository 0  . Insulin Pen Needle (B-D UF III MINI PEN NEEDLES) 31G X 5 MM MISC USE WHEN TAKING INSULIN. 100 each 5  . Insulin Syringe-Needle U-100 (INSULIN SYRINGE .5CC/30GX5/16") 30G X 5/16" 0.5 ML MISC Use as directed when taking insulin. 100 each 11  . LANTUS 100 UNIT/ML injection INJECT 20 UNITS INTO THE SKIN AT BEDTIME 10 mL 1  . NOVOLOG FLEXPEN 100 UNIT/ML FlexPen INJECT 3 UNITS INTO THE SKIN 3 (THREE) TIMES DAILY WITH MEALS. PER SLIDING SCALE 15 pen 0   No current facility-administered medications on file prior to visit.     Social History   Tobacco Use  . Smoking status: Current Every Day Smoker    Packs/day: 0.50    Years: 25.00    Pack years: 12.50    Types: Cigarettes  . Smokeless tobacco: Never Used  . Tobacco comment: Has Chantix  Substance Use  Topics  . Alcohol use: Yes    Alcohol/week: 1.8 oz    Types: 3 Cans of beer per week  . Drug use: No    Review of Systems  Constitutional: Negative for chills and fever.  Respiratory: Negative for cough.   Cardiovascular: Negative for chest pain and palpitations.  Gastrointestinal: Negative for nausea and vomiting.  Genitourinary: Positive for vaginal discharge. Negative for difficulty urinating, dysuria, pelvic pain and vaginal bleeding.  Psychiatric/Behavioral: Negative for suicidal ideas.      Objective:    BP 124/70 (BP Location: Left Arm, Patient Position: Sitting, Cuff Size: Normal)   Pulse 97   Temp (!) 97.4 F (36.3 C) (Oral)   Resp 16   Wt 123 lb (55.8 kg)   SpO2 99%    BMI 21.79 kg/m  BP Readings from Last 3 Encounters:  05/05/18 124/70  03/18/18 (!) 144/82  02/19/18 118/75   Wt Readings from Last 3 Encounters:  05/05/18 123 lb (55.8 kg)  03/18/18 116 lb 12.8 oz (53 kg)  02/19/18 115 lb 4.8 oz (52.3 kg)    Physical Exam  Constitutional: She appears well-developed and well-nourished.  Eyes: Conjunctivae are normal.  Cardiovascular: Normal rate, regular rhythm, normal heart sounds and normal pulses.  Pulmonary/Chest: Effort normal and breath sounds normal. She has no wheezes. She has no rhonchi. She has no rales.  Abdominal: There is no CVA tenderness.  Genitourinary: There is no rash, tenderness or lesion on the right labia. There is no rash, tenderness or lesion on the left labia. Cervix exhibits no motion tenderness and no discharge. Right adnexum displays no mass, no tenderness and no fullness. Left adnexum displays no mass, no tenderness and no fullness. No erythema, tenderness or bleeding in the vagina. No foreign body in the vagina. No vaginal discharge found.  Genitourinary Comments: No vulvovaginal erythema. No lesions.  Large amohnt of discharge -white, thick- seen in vagina. No purulent discharge.  Neurological: She is alert.  Skin: Skin is warm and dry.  Psychiatric: She has a normal mood and affect. Her speech is normal and behavior is normal. Thought content normal.  Vitals reviewed.      Assessment & Plan:   Problem List Items Addressed This Visit      Musculoskeletal and Integument   Eczema    Stable.  No flares at this time.  Education provided on taking lukewarm showers, use of emollients.  Topical steroid prescribed and advised patient to use sparingly due to side effects of discoloration, thinning of skin.      Relevant Medications   triamcinolone ointment (KENALOG) 0.1 %     Other   Screening for breast cancer    Very overdue.  Patient understands schedule.      Relevant Orders   MM 3D SCREEN BREAST BILATERAL    Nicotine dependence    Chantix prescribed again. Education on how to use/side effects discussed at length.      Relevant Medications   varenicline (CHANTIX STARTING MONTH PAK) 0.5 MG X 11 & 1 MG X 42 tablet   Yeast infection - Primary    Symptoms most consistent with Candida.  Went ahead and given patient oral Diflucan.  Pending wet prep      Relevant Medications   fluconazole (DIFLUCAN) 150 MG tablet   Other Relevant Orders   Cervicovaginal ancillary only       I am having Mary Haney start on fluconazole. I am also having her maintain her cetirizine, albuterol, INSULIN SYRINGE .  5CC/30GX5/16", hydrocortisone, FREESTYLE LIBRE SENSOR SYSTEM, FREESTYLE LIBRE READER, LANTUS, glucagon, NOVOLOG FLEXPEN, BD INSULIN SYRINGE U/F, Insulin Pen Needle, varenicline, and triamcinolone ointment.   Meds ordered this encounter  Medications  . varenicline (CHANTIX STARTING MONTH PAK) 0.5 MG X 11 & 1 MG X 42 tablet    Sig: Take one 0.5 mg tablet by mouth once daily for 3 days, then increase to one 0.5 mg tablet twice daily for 4 days, then increase to one 1 mg tablet twice daily.    Dispense:  53 tablet    Refill:  0    Order Specific Question:   Supervising Provider    Answer:   Deborra Medina L [[id number]]  . triamcinolone ointment (KENALOG) 0.1 %    Sig: Apply 1 application topically 2 (two) times daily.    Dispense:  80 g    Refill:  2    Order Specific Question:   Supervising Provider    Answer:   Derrel Nip, TERESA L [[id number]]  . fluconazole (DIFLUCAN) 150 MG tablet    Sig: Take one tablet PO once. If continue to have symptoms, may take one tablet PO 3 days later.    Dispense:  2 tablet    Refill:  3    Order Specific Question:   Supervising Provider    Answer:   Crecencio Mc [[id number]]    Return precautions given.   Risks, benefits, and alternatives of the medications and treatment plan prescribed today were discussed, and patient expressed understanding.   Education regarding symptom  management and diagnosis given to patient on AVS.  Continue to follow with Burnard Hawthorne, FNP for routine health maintenance.   Mary Haney and I agreed with plan.   Mable Paris, FNP

## 2018-05-07 DIAGNOSIS — B379 Candidiasis, unspecified: Secondary | ICD-10-CM | POA: Insufficient documentation

## 2018-05-07 NOTE — Assessment & Plan Note (Signed)
Stable.  No flares at this time.  Education provided on taking lukewarm showers, use of emollients.  Topical steroid prescribed and advised patient to use sparingly due to side effects of discoloration, thinning of skin.

## 2018-05-07 NOTE — Assessment & Plan Note (Signed)
Chantix prescribed again. Education on how to use/side effects discussed at length.

## 2018-05-07 NOTE — Assessment & Plan Note (Signed)
Symptoms most consistent with Candida.  Went ahead and given patient oral Diflucan.  Pending wet prep

## 2018-05-07 NOTE — Assessment & Plan Note (Signed)
Very overdue.  Patient understands schedule.

## 2018-05-08 ENCOUNTER — Other Ambulatory Visit: Payer: Self-pay | Admitting: Internal Medicine

## 2018-05-10 LAB — CERVICOVAGINAL ANCILLARY ONLY: WET PREP (BD AFFIRM): POSITIVE — AB

## 2018-05-10 MED ORDER — FREESTYLE LIBRE 14 DAY SENSOR MISC: 1.0000 | 4 refills | Status: DC

## 2018-05-10 MED ORDER — FREESTYLE LIBRE 14 DAY READER DEVI: 1.0000 | Freq: Once | 0 refills | Status: DC

## 2018-06-11 ENCOUNTER — Telehealth: Payer: Self-pay | Admitting: Family

## 2018-06-11 NOTE — Telephone Encounter (Signed)
Called pt. To discuss her question about massage oil.  Reported she just wanted information on which massage oil is recommended for her skin, with hx. of eczema.  Denied having any areas of eczema that are flared up at this time.  Stated her typical areas of eczema outbreak are in the antecubital space, behind the knees, and on the scalp.  Stated she wanted recommendation on a massage oil instead of lotion.  Advised that perfumes/fragrances and preservatives present in different products, can cause irritation, and potential flare of the eczema. Advised will send message to her PCP for recommendation.  Advised that it will likely be next week before she will receive any recommendations, as PCP is unavailable today.  Pt. Verb. understanding.       Message from Neva Seat sent at 06/11/2018 1:42 PM EDT   Summary: Question for eczema skin.   Pt has eczema. Is asking which massage oil is safe to use.

## 2018-06-11 NOTE — Telephone Encounter (Signed)
This encounter was created in error - please disregard.

## 2018-06-12 ENCOUNTER — Other Ambulatory Visit: Payer: Self-pay | Admitting: Family

## 2018-06-14 NOTE — Telephone Encounter (Signed)
Call pt  In terms of treating eczema, I do not have any research or knowledge of 'massage oil' per se and treating eczema.    The mainstay of treatment for eczema remains emollient therapy and preventative lifestyle measures.  Preventive lifestyle measures include taking tepid baths, showers.  Not taking long hot showers as that really dries out the skin.    After showering, damp dry off, and apply emollient such as Vaseline.    If she would like more information about massage oils, I would actually recommend her see dermatology.  I am happy to place referral for her.  Please let me know.

## 2018-06-17 NOTE — Telephone Encounter (Signed)
Mychart message sent.

## 2018-06-22 ENCOUNTER — Encounter: Payer: Self-pay | Admitting: Internal Medicine

## 2018-06-22 ENCOUNTER — Ambulatory Visit: Payer: BLUE CROSS/BLUE SHIELD | Admitting: Internal Medicine

## 2018-06-22 VITALS — BP 120/78 | HR 101 | Ht 63.0 in | Wt 123.8 lb

## 2018-06-22 DIAGNOSIS — E0789 Other specified disorders of thyroid: Secondary | ICD-10-CM

## 2018-06-22 DIAGNOSIS — E1065 Type 1 diabetes mellitus with hyperglycemia: Secondary | ICD-10-CM

## 2018-06-22 LAB — POCT GLYCOSYLATED HEMOGLOBIN (HGB A1C): Hemoglobin A1C: 7.4 % — AB (ref 4.0–5.6)

## 2018-06-22 MED ORDER — FREESTYLE LIBRE 14 DAY SENSOR MISC: 1.0000 | 3 refills | Status: DC

## 2018-06-22 MED ORDER — INSULIN GLARGINE 100 UNIT/ML ~~LOC~~ SOLN: SUBCUTANEOUS | 11 refills | Status: DC

## 2018-06-22 MED ORDER — INSULIN ASPART 100 UNIT/ML FLEXPEN: 3.0000 [IU] | PEN_INJECTOR | Freq: Three times a day (TID) | SUBCUTANEOUS | 3 refills | Status: DC

## 2018-06-22 NOTE — Patient Instructions (Addendum)
Please decrease: - Lantus 7 units in am and 6 units at bedtime - Novolog:  Snack: 1 unit B'fast: 2-3 units if sugars <150, 3-4 units if sugars >150 Lunch: 2-3 units if sugars <150, 3-4 units if sugars >150 Dinner: 3-4 units  Please come back for a follow-up appointment in 3 months.

## 2018-06-22 NOTE — Progress Notes (Signed)
Patient ID: Mary Haney, female   DOB: 1971-08-16, 47 y.o.   MRN: [id number]  HPI: Mary Haney is a 47 y.o.-year-old female, returning for f/u for DM, dx 2013, insulin-dependent, uncontrolled, without long term complications. Last visit was 3 mo ago. PCP: Dr. Caryl Bis  Reviewed history: In 07/2012 she went to the ED because she was not feeling well. She was found to have a blood glucose level of 600 + and was admitted with DKA. She was followed by [name] immediately following initial diagnosis, then switched to [facility] endocrinology.   In 10/2013, due to lack of finances, she was stretching the insulin doses >> sugars high >> low CBG 25-26 >> ED.    She was admitted in 04/2016 for DKA.  At the end of 2017, she met the DM educator and nutritionist >> decided for Omnipod pump >> but nobody called her back >> this year cannot afford it.  She decided against an insulin pump.  She is active at work >> sometimes misses the insulin with lunch.  Reviewed HbA1c levels: Lab Results  Component Value Date   HGBA1C 7.7 03/18/2018   HGBA1C 7.9 12/17/2017   HGBA1C 7.9 09/15/2017   Pt is on a regimen of: - Lantus 8 units in am and 7 units at bedtime. She could not afford [place]. - Novolog:  Snack: 1-2 units B'fast: 3-4 units Lunch: 3-4 units Dinner:  5 >> 6 units   Pt checks her sugars many times a day with her freestyle libre CGM: Freestyle Libre CGM parameters: - average: 164 +/- 83.5% - coefficient of variation:  (19-25%) - time in range:  - low (<70): 15% - normal (70-180): 45% - high (>180): 40%   CBG 25-75%: - am:   170-180 >> 50s, 160-182, 200 >> 110-180 - 2h after b'fast: (1 unit- snack) >> 41-401 >> n/c >> 70-140 - before lunch: (3-4 units) 80-100 >> 87-110 >> 70-180 - 2h after lunch: n/c >> 378, 395 >> n/c >> 100-230 - before dinner:   160-170 >> 120-130 >> 90-210 - 2h after dinner:  32 x1, 179, 415 >> n/c >> 180-260 - bedtime:160-170 >> 160-175, 180 >> see  above - nighttime: n/c >> 100-260 Lowest sugar was 38 x1 >> 32 after dinner x1, 40 in am >> 3 am: 51 >> 68 >> 70 >> 37 (very busy, forgot to eat) >> 40; she has hypoglycemia awareness  in the 60s. Highest sugar was 358 >> 308 >> 200s >> 400  Has ReliOn meter.  Pt's meals are: - Breakfast: oatmeal, yoghurt, sausage bisquit - Lunch: salad, soup, sandwiches - Dinner: meat + starch + vegetables - Snacks: yoghurt, peanut crackers, nuts  She is very busy at work and does not have time allotted for lunch.  She grabs lunch on the go.  - No CKD: Lab Results  Component Value Date   BUN 10 09/15/2017   Lab Results  Component Value Date   CREATININE 0.68 09/15/2017   -No HL: Lab Results  Component Value Date   CHOL 157 09/15/2017   HDL 85.70 09/15/2017   LDLCALC 53 09/15/2017   TRIG 95.0 09/15/2017   CHOLHDL 2 09/15/2017   - last eye exam was in 03/2018: No DR -Denies numbness and tingling in her feet  She has a palpable thyroid.  Last TSH normal: Lab Results  Component Value Date   TSH 0.89 09/15/2017   Pt denies: - feeling nodules in neck - hoarseness - dysphagia - choking - SOB  with lying down  ROS: Constitutional: no weight gain/no weight loss, no fatigue, no subjective hyperthermia, no subjective hypothermia Eyes: no blurry vision, no xerophthalmia ENT: no sore throat, + see HPI Cardiovascular: no CP/no SOB/no palpitations/no leg swelling Respiratory: no cough/no SOB/no wheezing Gastrointestinal: no N/no V/no D/no C/no acid reflux Musculoskeletal: no muscle aches/no joint aches Skin: no rashes, no hair loss Neurological: no tremors/no numbness/no tingling/no dizziness  I reviewed pt's medications, allergies, PMH, social hx, family hx, and changes were documented in the history of present illness. Otherwise, unchanged from my initial visit note.  Past Medical History:  Diagnosis Date  . Alcoholic pancreatitis   . Asthma    Has albuterol inhaler  . Diabetes  mellitus without complication ([place])    Type 2 diagnosed 2013   Past Surgical History:  Procedure Laterality Date  . COLONOSCOPY WITH PROPOFOL N/A 02/11/2018   Procedure: COLONOSCOPY WITH PROPOFOL;  Surgeon: Lin Landsman, MD;  Location: [facility] ENDOSCOPY;  Service: Gastroenterology;  Laterality: N/A;  . NO PAST SURGERIES     History   Social History  . Marital Status: Single    Spouse Name: N/A    Number of Children: 0  . Years of Education: 14   Occupational History  . Take Out Specialist     [employer]   Social History Main Topics  . Smoking status: Current Every Day Smoker -- 0.50 packs/day for 25 years    Types: Cigarettes  . Smokeless tobacco: Never Used  . Alcohol Use: 1.8 oz/week    3 Cans of beer per week  . Drug Use: No  . Sexual Activity: Yes    Birth Control/ Protection: Condom   Social History Narrative   Mary Haney was born in [place], [place]. She moved with her family to [place] at age 48. She attended 2 years at [employer] and was majoring in [place]. Her goal is to become a Optometrist in the [employer]. 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 Mary Haney lives with her boyfriend, Mary Haney. They are considering getting married soon. She enjoys sketching outdoor scenery, cartoons and loves to read.   Current Outpatient Medications on File Prior to Visit  Medication Sig Dispense Refill  . albuterol (PROAIR HFA) 108 (90 Base) MCG/ACT inhaler Inhale 2 puffs into the lungs every 6 (six) hours as needed for wheezing or shortness of breath. 1 Inhaler 1  . BD INSULIN SYRINGE U/F 30G X 1/2" 0.5 ML MISC USE AS DIRECTED WHEN TAKING INSULIN 100 each 3  . cetirizine (ZYRTEC) 10 MG tablet Take 10 mg by mouth daily as needed for allergies.     . Continuous Blood Gluc Sensor (FREESTYLE LIBRE 14 DAY SENSOR) MISC 1 each by Does not apply route every 14 (fourteen) days. 3 each 4  . Continuous Blood Gluc Sensor (FREESTYLE LIBRE SENSOR SYSTEM) MISC 1  Device by Does not apply route every 30 (thirty) days. 3 each 11  . fluconazole (DIFLUCAN) 150 MG tablet Take one tablet PO once. If continue to have symptoms, may take one tablet PO 3 days later. 2 tablet 3  . glucagon (GLUCAGON EMERGENCY) 1 MG injection Inject 1 mg into the muscle once as needed for up to 1 dose. 1 each 12  . hydrocortisone (ANUSOL-HC) 25 MG suppository Place 1 suppository (25 mg total) rectally 2 (two) times daily. 12 suppository 0  . Insulin Pen Needle (B-D UF III MINI PEN NEEDLES) 31G X 5 MM MISC USE  WHEN TAKING INSULIN. 100 each 5  . Insulin Syringe-Needle U-100 (INSULIN SYRINGE .5CC/30GX5/16") 30G X 5/16" 0.5 ML MISC Use as directed when taking insulin. 100 each 11  . LANTUS 100 UNIT/ML injection INJECT 20 UNITS INTO THE SKIN AT BEDTIME 10 mL 1  . NOVOLOG FLEXPEN 100 UNIT/ML FlexPen INJECT 3 UNITS INTO THE SKIN 3 (THREE) TIMES DAILY WITH MEALS. PER SLIDING SCALE 5 pen 3  . triamcinolone ointment (KENALOG) 0.1 % Apply 1 application topically 2 (two) times daily. 80 g 2  . varenicline (CHANTIX STARTING MONTH PAK) 0.5 MG X 11 & 1 MG X 42 tablet Take one 0.5 mg tablet by mouth once daily for 3 days, then increase to one 0.5 mg tablet twice daily for 4 days, then increase to one 1 mg tablet twice daily. 53 tablet 0   No current facility-administered medications on file prior to visit.    No Known Allergies Family History  Problem Relation Age of Onset  . Diabetes Father   . Depression Paternal Grandmother   . Depression Paternal Grandfather   . Depression Cousin    PE: BP 120/78   Pulse (!) 101   Ht '5\' 3"'  (1.6 m)   Wt 123 lb 12.8 oz (56.2 kg)   LMP 06/08/2018   SpO2 98%   BMI 21.93 kg/m  Wt Readings from Last 3 Encounters:  06/22/18 123 lb 12.8 oz (56.2 kg)  05/05/18 123 lb (55.8 kg)  03/18/18 116 lb 12.8 oz (53 kg)   Constitutional: Normal weight, in NAD Eyes: PERRLA, EOMI, no exophthalmos ENT: moist mucous membranes, + palpable thyroid, no cervical  lymphadenopathy Cardiovascular: tachycardia, RR, No MRG Respiratory: CTA B Gastrointestinal: abdomen soft, NT, ND, BS+ Musculoskeletal: no deformities, strength intact in all 4 Skin: moist, warm, no rashes Neurological: no tremor with outstretched hands, DTR normal in all 4  ASSESSMENT: 1. DM, insulin-dependent, uncontrolled, with complications - hyper- and hypo-glycemia - DKA  Component     Latest Ref Rng & Units 11/04/2016  Hemoglobin A1C      9.0  C-Peptide     0.80 - 3.85 ng/mL <0.10 (L)  Glucose, Fasting     65 - 99 mg/dL 40 (L)  Glutamic Acid Decarb Ab     <5 IU/mL >250 (H)  Pancreatic Islet Cell Antibody     <5 JDF Units <5   Glucose very low at the time of the blood draw, therefore, C-peptide is not interpretable. However, GAD antibodies are undetectably high, confirming type 1 diabetes.  2.  Palpable thyroid  PLAN:  1. Patient with type 1 diabetes, on basal-bolus insulin regimen, with still higher sugars in a.m. at last visit but better in the rest of the day.  After dinner, they were also higher so we discussed about increasing NovoLog with dinner from 5 units to 6 units.  She has had some low blood sugars in the morning, in the 50s, so we did not increase the Lantus at last visit.  We try to use Tresiba in the past but this was not covered by her insurance.  We also used a sliding scale before but she was not using this so we stopped it.  She was on a freestyle libre CGM, but she developed pain on the insertion site so she stopped. - At this visit, we reviewed the downloads of her freestyle libre CGM together.  The following patterns were identified: Her sugars usually decrease after breakfast and then increase to worse late afternoon.  However,  they are dropping before dinner and increasing precipitously after dinner.  The sugars also decrease after she injects Lantus at night.  At this point, I suggested to decrease the Lantus by 1 unit, 2 7 units in the morning and 6  units  at night since she appears to drop her sugars every time she injects it.  Also, reviewing the downloads, she is over correcting her lows so we discussed about not doing so.  She is not always bolusing before meals and I strongly advised her to do that.  Also, every time her sugars are normal before a meal and she injects 4 units, her sugars will drop so I advised her not to inject 4 units before a meal unless the sugars are higher than 150 before that meal.  For dinner, I advised her instead of 5 units to try to lower this to 4 units.  Also, with snacks, rather than 2 units, will limit the bolus to 1 unit.  She is very insulin sensitive and she may need the nova fine pen with half unit dosing at next visit. -  I suggested to:  Patient Instructions  Please decrease: - Lantus 7 units in am and 6 units at bedtime - Novolog:  Snack: 1 unit B'fast: 2-3 units if sugars <150, 3-4 units if sugars >150 Lunch: 2-3 units if sugars <150, 3-4 units if sugars >150 Dinner: 3-4 units  Please come back for a follow-up appointment in 3 months.  - today, HbA1c is 7.4% (lower) - continue checking sugars at different times of the day - check 3x a day, rotating checks - advised for yearly eye exams >> she is UTD - We will check annual labs at next visit - Return to clinic in 3 mo with sugar log   2.  Palpable thyroid - Symmetric, nonnodular - No neck compression symptoms - most recent TSH was normal - We will continue to follow her clinically  - time spent with the patient: 40 min, of which >50% was spent in reviewing her CGM downloads, discussing her hypo- and hyper-glycemic episodes, reviewing previous labs and insulin doses and developing a plan to avoid hypo- and hyper-glycemia.      Philemon Kingdom, MD PhD [facility] Endocrinology

## 2018-06-23 NOTE — Telephone Encounter (Signed)
Mailed to patient

## 2018-07-01 ENCOUNTER — Ambulatory Visit: Payer: BLUE CROSS/BLUE SHIELD

## 2018-07-20 ENCOUNTER — Ambulatory Visit
Admission: RE | Admit: 2018-07-20 | Discharge: 2018-07-20 | Disposition: A | Discharge status: Home / Self Care | Payer: BLUE CROSS/BLUE SHIELD | Source: Ambulatory Visit | Attending: Family | Admitting: Family

## 2018-07-20 DIAGNOSIS — Z1239 Encounter for other screening for malignant neoplasm of breast: Secondary | ICD-10-CM

## 2018-07-20 DIAGNOSIS — Z1231 Encounter for screening mammogram for malignant neoplasm of breast: Secondary | ICD-10-CM | POA: Insufficient documentation

## 2018-07-21 ENCOUNTER — Other Ambulatory Visit: Payer: Self-pay | Admitting: Family

## 2018-07-21 DIAGNOSIS — R928 Other abnormal and inconclusive findings on diagnostic imaging of breast: Secondary | ICD-10-CM

## 2018-07-21 DIAGNOSIS — N6320 Unspecified lump in the left breast, unspecified quadrant: Secondary | ICD-10-CM

## 2018-08-05 ENCOUNTER — Telehealth: Payer: Self-pay | Admitting: Family

## 2018-08-05 DIAGNOSIS — R239 Unspecified skin changes: Secondary | ICD-10-CM

## 2018-08-05 NOTE — Telephone Encounter (Signed)
Copied from [place] [id number]. Topic: Quick Communication - See Telephone Encounter >> Aug 05, 2018  3:40 PM Gardiner Ramus wrote: CRM for notification. See Telephone encounter for: 08/05/18. Pt called and stated that she needs a referral to a dermatologist. Pt states she has eczema. Please advise.

## 2018-08-05 NOTE — Telephone Encounter (Signed)
Referral request

## 2018-08-06 NOTE — Telephone Encounter (Signed)
Referral placed.  Please advise patient we will call her in the next week or so to schedule.  Please let our office know if she does not hear from [place].

## 2018-08-11 NOTE — Telephone Encounter (Signed)
Patient advised of below and verbalized understanding.  

## 2018-08-30 ENCOUNTER — Ambulatory Visit
Admission: RE | Admit: 2018-08-30 | Discharge: 2018-08-30 | Disposition: A | Discharge status: Home / Self Care | Payer: BLUE CROSS/BLUE SHIELD | Source: Ambulatory Visit | Attending: Family | Admitting: Family

## 2018-08-30 ENCOUNTER — Encounter: Payer: Self-pay | Admitting: Family

## 2018-08-30 ENCOUNTER — Ambulatory Visit: Payer: BLUE CROSS/BLUE SHIELD | Admitting: Family

## 2018-08-30 VITALS — BP 132/80 | HR 84 | Temp 98.7°F | Resp 15 | Ht 63.0 in | Wt 124.4 lb

## 2018-08-30 DIAGNOSIS — J329 Chronic sinusitis, unspecified: Secondary | ICD-10-CM

## 2018-08-30 DIAGNOSIS — N6320 Unspecified lump in the left breast, unspecified quadrant: Secondary | ICD-10-CM

## 2018-08-30 DIAGNOSIS — R928 Other abnormal and inconclusive findings on diagnostic imaging of breast: Secondary | ICD-10-CM

## 2018-08-30 MED ORDER — AMOXICILLIN 500 MG PO CAPS: 500.0000 mg | ORAL_CAPSULE | Freq: Three times a day (TID) | ORAL | 0 refills | Status: DC

## 2018-08-30 NOTE — Progress Notes (Signed)
Subjective:    Patient ID: Mary Haney, female    DOB: [birth year], 47 y.o.   MRN: [id number]  CC: Mary Haney is a 47 y.o. female who presents today for an acute visit.    HPI: CC: congestion and HA 4 days, unchanged.   Congesiton has improved. Blowing nose with thick, white congestion. NO sore throat, cough, stiff neck., fever, ear pain.  Feels pressure across forehead, ' not a headache'.  No numbness or rash.  No h/o HA's. Hurts to put glasses on, face is tender on nose and above eye brows.  Feels better when takes glasses off. No pain with chewing.  Has been taking aleve with some relief. OTC sinus medication ( unsure of name) with no relief/   No vision changes, rash.  Wearing glasses.   H/o seasonal allergies.  H/o asthma. No wheezing, SOB.    HISTORY:  Past Medical History:  Diagnosis Date  . Alcoholic pancreatitis   . Asthma    Has albuterol inhaler  . Diabetes mellitus without complication ([place])    Type 2 diagnosed 2013   Past Surgical History:  Procedure Laterality Date  . COLONOSCOPY WITH PROPOFOL N/A 02/11/2018   Procedure: COLONOSCOPY WITH PROPOFOL;  Surgeon: Lin Landsman, MD;  Location: [facility] ENDOSCOPY;  Service: Gastroenterology;  Laterality: N/A;  . NO PAST SURGERIES     Family History  Problem Relation Age of Onset  . Diabetes Father   . Depression Paternal Grandmother   . Depression Paternal Grandfather   . Depression Cousin   . Breast cancer Neg Hx     Allergies: Patient has no known allergies. Current Outpatient Medications on File Prior to Visit  Medication Sig Dispense Refill  . albuterol (PROAIR HFA) 108 (90 Base) MCG/ACT inhaler Inhale 2 puffs into the lungs every 6 (six) hours as needed for wheezing or shortness of breath. 1 Inhaler 1  . BD INSULIN SYRINGE U/F 30G X 1/2" 0.5 ML MISC USE AS DIRECTED WHEN TAKING INSULIN 100 each 3  . cetirizine (ZYRTEC) 10 MG tablet Take 10 mg by mouth daily as needed for allergies.     . Continuous  Blood Gluc Sensor (FREESTYLE LIBRE 14 DAY SENSOR) MISC 1 each by Does not apply route every 14 (fourteen) days. 6 each 3  . fluconazole (DIFLUCAN) 150 MG tablet Take one tablet PO once. If continue to have symptoms, may take one tablet PO 3 days later. 2 tablet 3  . glucagon (GLUCAGON EMERGENCY) 1 MG injection Inject 1 mg into the muscle once as needed for up to 1 dose. 1 each 12  . hydrocortisone (ANUSOL-HC) 25 MG suppository Place 1 suppository (25 mg total) rectally 2 (two) times daily. 12 suppository 0  . insulin aspart (NOVOLOG FLEXPEN) 100 UNIT/ML FlexPen Inject 3-4 Units into the skin 3 (three) times daily with meals. 5 pen 3  . insulin glargine (LANTUS) 100 UNIT/ML injection INJECT 13 UNITS INTO THE SKIN as advised 10 mL 11  . Insulin Pen Needle (B-D UF III MINI PEN NEEDLES) 31G X 5 MM MISC USE WHEN TAKING INSULIN. 100 each 5  . Insulin Syringe-Needle U-100 (INSULIN SYRINGE .5CC/30GX5/16") 30G X 5/16" 0.5 ML MISC Use as directed when taking insulin. 100 each 11  . triamcinolone ointment (KENALOG) 0.1 % Apply 1 application topically 2 (two) times daily. 80 g 2  . varenicline (CHANTIX STARTING MONTH PAK) 0.5 MG X 11 & 1 MG X 42 tablet Take one 0.5 mg tablet by mouth once daily  for 3 days, then increase to one 0.5 mg tablet twice daily for 4 days, then increase to one 1 mg tablet twice daily. 53 tablet 0   No current facility-administered medications on file prior to visit.     Social History   Tobacco Use  . Smoking status: Current Every Day Smoker    Packs/day: 0.50    Years: 25.00    Pack years: 12.50    Types: Cigarettes  . Smokeless tobacco: Never Used  . Tobacco comment: Has Chantix  Substance Use Topics  . Alcohol use: Yes    Alcohol/week: 3.0 standard drinks    Types: 3 Cans of beer per week  . Drug use: No    Review of Systems  Constitutional: Negative for chills and fever.  HENT: Positive for congestion, sinus pressure and sinus pain. Negative for ear pain, sore throat  and trouble swallowing.   Respiratory: Negative for cough, shortness of breath and wheezing.   Cardiovascular: Negative for chest pain and palpitations.  Gastrointestinal: Negative for nausea and vomiting.  Neurological: Negative for dizziness, numbness and headaches.      Objective:    BP 132/80 (BP Location: Left Arm, Patient Position: Sitting, Cuff Size: Normal)   Pulse 84   Temp 98.7 F (37.1 C) (Oral)   Resp 15   Ht 5\' 3"  (1.6 m)   Wt 124 lb 6 oz (56.4 kg)   SpO2 99%   BMI 22.03 kg/m    Physical Exam  Constitutional: She appears well-developed and well-nourished.  HENT:  Head: Normocephalic and atraumatic.  Right Ear: Hearing, tympanic membrane, external ear and ear canal normal. No swelling or tenderness. Tympanic membrane is not erythematous and not bulging. No middle ear effusion.  Left Ear: Tympanic membrane, external ear and ear canal normal. No swelling or tenderness. Tympanic membrane is not erythematous and not bulging.  No middle ear effusion.  Nose: No rhinorrhea. Right sinus exhibits frontal sinus tenderness. Right sinus exhibits no maxillary sinus tenderness. Left sinus exhibits frontal sinus tenderness. Left sinus exhibits no maxillary sinus tenderness.  Mouth/Throat: Uvula is midline, oropharynx is clear and moist and mucous membranes are normal. No posterior oropharyngeal edema or posterior oropharyngeal erythema.  No rash  Eyes: Pupils are equal, round, and reactive to light. Conjunctivae, EOM and lids are normal. Lids are everted and swept, no foreign bodies found.  Normal fundus bilaterally   Cardiovascular: Normal rate, regular rhythm, normal heart sounds and normal pulses.  Pulmonary/Chest: Effort normal and breath sounds normal. She has no wheezes. She has no rhonchi. She has no rales.  Lymphadenopathy:       Head (right side): No submental, no submandibular, no tonsillar, no preauricular, no posterior auricular and no occipital adenopathy present.        Head (left side): No submental, no submandibular, no tonsillar, no preauricular, no posterior auricular and no occipital adenopathy present.    She has no cervical adenopathy.       Right cervical: No superficial cervical, no deep cervical and no posterior cervical adenopathy present.      Left cervical: No superficial cervical, no deep cervical and no posterior cervical adenopathy present.  Neurological: She is alert. She has normal strength. No cranial nerve deficit or sensory deficit. She displays a negative Romberg sign.  Reflex Scores:      Bicep reflexes are 2+ on the right side and 2+ on the left side.      Patellar reflexes are 2+ on the right side  and 2+ on the left side. Grip equal and strong bilateral upper extremities. Gait strong and steady. Able to perform  finger-to-nose without difficulty.   Skin: Skin is warm and dry.  Psychiatric: She has a normal mood and affect. Her speech is normal and behavior is normal. Thought content normal.  Vitals reviewed.      Assessment & Plan:   1. Sinusitis, unspecified chronicity, unspecified location Patient is well-appearing, she is nontoxic in appearance.  Normal neurologic exam.  She has notable facial tenderness on exam. We will go ahead and start empiric therapy for sinus infection with amoxicillin.  Also advised her Mucinex over-the-counter.  She will let me know if no improvement. - amoxicillin (AMOXIL) 500 MG capsule; Take 1 capsule (500 mg total) by mouth 3 (three) times daily.  Dispense: 30 capsule; Refill: 0    I am having Mary Haney start on amoxicillin. I am also having her maintain her cetirizine, albuterol, INSULIN SYRINGE .5CC/30GX5/16", hydrocortisone, glucagon, BD INSULIN SYRINGE U/F, Insulin Pen Needle, varenicline, triamcinolone ointment, fluconazole, insulin aspart, insulin glargine, and FREESTYLE LIBRE 14 DAY SENSOR.   Meds ordered this encounter  Medications  . amoxicillin (AMOXIL) 500 MG capsule    Sig: Take  1 capsule (500 mg total) by mouth 3 (three) times daily.    Dispense:  30 capsule    Refill:  0    Order Specific Question:   Supervising Provider    Answer:   Crecencio Mc [[id number]]    Return precautions given.   Risks, benefits, and alternatives of the medications and treatment plan prescribed today were discussed, and patient expressed understanding.   Education regarding symptom management and diagnosis given to patient on AVS.  Continue to follow with Burnard Hawthorne, FNP for routine health maintenance.   Mary Haney and I agreed with plan.   Mable Paris, FNP

## 2018-08-30 NOTE — Patient Instructions (Signed)
Start mucinex to help thin congestion  May continue to use aleve  Start amoxicillin for suspected sinus infection   Ensure to take probiotics while on antibiotics and also for 2 weeks after completion. It is important to re-colonize the gut with good bacteria and also to prevent any diarrheal infections associated with antibiotic use.    Let me know if facial pain does not improve.

## 2018-09-03 ENCOUNTER — Other Ambulatory Visit: Payer: Self-pay | Admitting: Family

## 2018-09-03 DIAGNOSIS — N6320 Unspecified lump in the left breast, unspecified quadrant: Secondary | ICD-10-CM

## 2018-10-08 ENCOUNTER — Ambulatory Visit: Payer: BLUE CROSS/BLUE SHIELD | Admitting: Internal Medicine

## 2018-11-08 ENCOUNTER — Encounter: Payer: Self-pay | Admitting: Family

## 2018-11-08 ENCOUNTER — Ambulatory Visit: Payer: BLUE CROSS/BLUE SHIELD | Admitting: Family

## 2018-11-08 ENCOUNTER — Ambulatory Visit (INDEPENDENT_AMBULATORY_CARE_PROVIDER_SITE_OTHER): Payer: BLUE CROSS/BLUE SHIELD

## 2018-11-08 VITALS — BP 144/82 | HR 95 | Temp 98.1°F | Wt 123.6 lb

## 2018-11-08 DIAGNOSIS — Z3202 Encounter for pregnancy test, result negative: Secondary | ICD-10-CM

## 2018-11-08 DIAGNOSIS — M25511 Pain in right shoulder: Secondary | ICD-10-CM | POA: Insufficient documentation

## 2018-11-08 DIAGNOSIS — J45909 Unspecified asthma, uncomplicated: Secondary | ICD-10-CM

## 2018-11-08 DIAGNOSIS — E1065 Type 1 diabetes mellitus with hyperglycemia: Secondary | ICD-10-CM | POA: Diagnosis not present

## 2018-11-08 LAB — POCT URINE PREGNANCY: PREG TEST UR: NEGATIVE

## 2018-11-08 MED ORDER — ALBUTEROL SULFATE HFA 108 (90 BASE) MCG/ACT IN AERS: 2.0000 | INHALATION_SPRAY | Freq: Four times a day (QID) | RESPIRATORY_TRACT | 1 refills | Status: DC | PRN

## 2018-11-08 NOTE — Assessment & Plan Note (Addendum)
Acute. Concern for dislocation based on mechanism of injury. Pending Xr to evaluate for fracture. Orthopedic appointment today at 1pm.

## 2018-11-08 NOTE — Patient Instructions (Signed)
Ice ice ice. Every hour for 20 minutes  Depending on xray results, will send in something for severe pain; however I want you to use ibuprofen with food around the clock for now.   Will call with xray results  Please reschedule physical exam

## 2018-11-08 NOTE — Progress Notes (Signed)
Subjective:    Patient ID: Mary Haney, female    DOB: Apr 12, 1971, 47 y.o.   MRN: [id number]  CC: Mary Haney is a 47 y.o. female who presents today for follow up.   HPI: Right shoulder pain after fall in shower  Hit lateral side shoulder on shower and shower rail . Taken ibuprofen without relief.    Didn't hit head. No loc. No HA, vision changes. No numbness. No brusing, laceration.   Asthma- stable. Hasnt used albuterol 'all winter'  DM- follows with gherghe. No hypoglyemic episodes.   No h/o seizures. No h/o drug addiction.       HISTORY:  Past Medical History:  Diagnosis Date  . Alcoholic pancreatitis   . Asthma    Has albuterol inhaler  . Diabetes mellitus without complication ([place])    Type 2 diagnosed 2013   Past Surgical History:  Procedure Laterality Date  . COLONOSCOPY WITH PROPOFOL N/A 02/11/2018   Procedure: COLONOSCOPY WITH PROPOFOL;  Surgeon: Lin Landsman, MD;  Location: [facility] ENDOSCOPY;  Service: Gastroenterology;  Laterality: N/A;  . NO PAST SURGERIES     Family History  Problem Relation Age of Onset  . Diabetes Father   . Depression Paternal Grandmother   . Depression Paternal Grandfather   . Depression Cousin   . Breast cancer Neg Hx     Allergies: Patient has no known allergies. Current Outpatient Medications on File Prior to Visit  Medication Sig Dispense Refill  . amoxicillin (AMOXIL) 500 MG capsule Take 1 capsule (500 mg total) by mouth 3 (three) times daily. 30 capsule 0  . BD INSULIN SYRINGE U/F 30G X 1/2" 0.5 ML MISC USE AS DIRECTED WHEN TAKING INSULIN 100 each 3  . cetirizine (ZYRTEC) 10 MG tablet Take 10 mg by mouth daily as needed for allergies.     . Continuous Blood Gluc Sensor (FREESTYLE LIBRE 14 DAY SENSOR) MISC 1 each by Does not apply route every 14 (fourteen) days. 6 each 3  . fluconazole (DIFLUCAN) 150 MG tablet Take one tablet PO once. If continue to have symptoms, may take one tablet PO 3 days later. 2 tablet 3    . glucagon (GLUCAGON EMERGENCY) 1 MG injection Inject 1 mg into the muscle once as needed for up to 1 dose. 1 each 12  . hydrocortisone (ANUSOL-HC) 25 MG suppository Place 1 suppository (25 mg total) rectally 2 (two) times daily. 12 suppository 0  . insulin aspart (NOVOLOG FLEXPEN) 100 UNIT/ML FlexPen Inject 3-4 Units into the skin 3 (three) times daily with meals. 5 pen 3  . insulin glargine (LANTUS) 100 UNIT/ML injection INJECT 13 UNITS INTO THE SKIN as advised 10 mL 11  . Insulin Pen Needle (B-D UF III MINI PEN NEEDLES) 31G X 5 MM MISC USE WHEN TAKING INSULIN. 100 each 5  . Insulin Syringe-Needle U-100 (INSULIN SYRINGE .5CC/30GX5/16") 30G X 5/16" 0.5 ML MISC Use as directed when taking insulin. 100 each 11  . triamcinolone ointment (KENALOG) 0.1 % Apply 1 application topically 2 (two) times daily. 80 g 2  . varenicline (CHANTIX STARTING MONTH PAK) 0.5 MG X 11 & 1 MG X 42 tablet Take one 0.5 mg tablet by mouth once daily for 3 days, then increase to one 0.5 mg tablet twice daily for 4 days, then increase to one 1 mg tablet twice daily. 53 tablet 0   No current facility-administered medications on file prior to visit.     Social History   Tobacco Use  .  Smoking status: Current Every Day Smoker    Packs/day: 0.50    Years: 25.00    Pack years: 12.50    Types: Cigarettes  . Smokeless tobacco: Never Used  . Tobacco comment: Has Chantix  Substance Use Topics  . Alcohol use: Yes    Alcohol/week: 3.0 standard drinks    Types: 3 Cans of beer per week  . Drug use: No    Review of Systems  Constitutional: Negative for chills and fever.  Eyes: Negative for visual disturbance.  Respiratory: Negative for cough.   Cardiovascular: Negative for chest pain and palpitations.  Gastrointestinal: Negative for nausea and vomiting.  Musculoskeletal: Positive for joint swelling.  Skin: Negative for rash and wound.  Neurological: Negative for numbness and headaches.      Objective:    BP (!)  144/82 (BP Location: Left Arm, Cuff Size: Normal)   Pulse 95   Temp 98.1 F (36.7 C)   Wt 123 lb 9.6 oz (56.1 kg)   SpO2 97%   BMI 21.89 kg/m  BP Readings from Last 3 Encounters:  11/08/18 (!) 144/82  08/30/18 132/80  06/22/18 120/78   Wt Readings from Last 3 Encounters:  11/08/18 123 lb 9.6 oz (56.1 kg)  08/30/18 124 lb 6 oz (56.4 kg)  06/22/18 123 lb 12.8 oz (56.2 kg)    Physical Exam  Constitutional: She appears well-developed and well-nourished.  Eyes: Conjunctivae are normal.  Cardiovascular: Normal rate, regular rhythm, normal heart sounds and normal pulses.  Pulmonary/Chest: Effort normal and breath sounds normal. She has no wheezes. She has no rhonchi. She has no rales.  Musculoskeletal:       Right shoulder: She exhibits decreased range of motion, tenderness, bony tenderness, swelling and pain. She exhibits no effusion, no crepitus, no deformity, no laceration, normal pulse and normal strength.       Arms: Localized swelling proximal to clavicle as noted on diagram.  Guarding ROM limited; able to shrug shoulder however causes pain. Unable to perform ROM exercises due to pain.   Neurological: She is alert.  Skin: Skin is warm and dry.  Psychiatric: She has a normal mood and affect. Her speech is normal and behavior is normal. Thought content normal.  Vitals reviewed.      Assessment & Plan:   Problem List Items Addressed This Visit      Respiratory   Asthma    Stable.  Refilled albuterol for as needed use.      Relevant Medications   albuterol (PROAIR HFA) 108 (90 Base) MCG/ACT inhaler     Endocrine   Uncontrolled type 1 diabetes mellitus with hyperglycemia, with long-term current use of insulin (HCC)     Other   Acute pain of right shoulder - Primary    Acute. Concern for dislocation based on mechanism of injury. Pending Xr to evaluate for fracture. Orthopedic appointment today at 1pm.       Relevant Orders   DG Shoulder Right   DG Clavicle Right    POCT urine pregnancy   Ambulatory referral to Orthopedic Surgery       I am having Mary Haney maintain her cetirizine, INSULIN SYRINGE .5CC/30GX5/16", hydrocortisone, glucagon, BD INSULIN SYRINGE U/F, Insulin Pen Needle, varenicline, triamcinolone ointment, fluconazole, insulin aspart, insulin glargine, FREESTYLE LIBRE 14 DAY SENSOR, amoxicillin, and albuterol.   Meds ordered this encounter  Medications  . albuterol (PROAIR HFA) 108 (90 Base) MCG/ACT inhaler    Sig: Inhale 2 puffs into the lungs every 6 (six)  hours as needed for wheezing or shortness of breath.    Dispense:  1 Inhaler    Refill:  1    Order Specific Question:   Supervising Provider    Answer:   Crecencio Mc [[id number]]    Return precautions given.   Risks, benefits, and alternatives of the medications and treatment plan prescribed today were discussed, and patient expressed understanding.   Education regarding symptom management and diagnosis given to patient on AVS.  Continue to follow with Burnard Hawthorne, FNP for routine health maintenance.   Mary Haney and I agreed with plan.   Mable Paris, FNP

## 2018-11-08 NOTE — Assessment & Plan Note (Signed)
Stable.  Refilled albuterol for as needed use.

## 2019-01-27 ENCOUNTER — Ambulatory Visit: Payer: BLUE CROSS/BLUE SHIELD | Admitting: Internal Medicine

## 2019-02-15 ENCOUNTER — Other Ambulatory Visit: Payer: Self-pay

## 2019-02-15 ENCOUNTER — Ambulatory Visit: Payer: BLUE CROSS/BLUE SHIELD | Admitting: Internal Medicine

## 2019-02-15 ENCOUNTER — Encounter: Payer: Self-pay | Admitting: Internal Medicine

## 2019-02-15 VITALS — BP 130/80 | HR 97 | Ht 63.0 in | Wt 127.0 lb

## 2019-02-15 DIAGNOSIS — E0789 Other specified disorders of thyroid: Secondary | ICD-10-CM

## 2019-02-15 DIAGNOSIS — E1065 Type 1 diabetes mellitus with hyperglycemia: Secondary | ICD-10-CM | POA: Diagnosis not present

## 2019-02-15 LAB — POCT GLYCOSYLATED HEMOGLOBIN (HGB A1C): HEMOGLOBIN A1C: 9.1 % — AB (ref 4.0–5.6)

## 2019-02-15 NOTE — Patient Instructions (Addendum)
Please change: - Lantus 6 units in a.m. and 6 units at bedtime - Novolog:  Snack and coffee: 1 unit B'fast: 2-3 units if sugars <150, 3-4 units if sugars >150 Lunch: 2-3 units if sugars <150, 3-4 units if sugars >150 Dinner: 4-5 units  Please come back for a follow-up appointment in 3-4 months.

## 2019-02-15 NOTE — Addendum Note (Signed)
Addended by: Cardell Peach I on: 02/15/2019 03:44 PM   Modules accepted: Orders

## 2019-02-15 NOTE — Progress Notes (Signed)
Patient ID: Nyesha Cliff, female   DOB: 21-May-1971, 48 y.o.   MRN: [id number]  HPI: Forest Pruden is a 48 y.o.-year-old female, returning for f/u for DM, dx 2013, insulin-dependent, uncontrolled, without long term complications. Last visit was 8 mo ago. PCP: Dr. Caryl Bis  Reviewed history: In 07/2012 she went to the ED because she was not feeling well. She was found to have a blood glucose level of 600 + and was admitted with DKA. She was followed by [name] immediately following initial diagnosis, then switched to [facility] endocrinology.   In 10/2013, due to lack of finances, she was stretching the insulin doses >> sugars high >> low CBG 25-26 >> ED.    She was admitted in 04/2016 for DKA.  At the end of 2017, she met the DM educator and nutritionist >> decided for Omnipod pump >> but nobody called her back >> this year cannot afford it.  She decided against an insulin pump.  She is active at work >> sometimes misses the insulin with lunch.  Reviewed HbA1c levels: Lab Results  Component Value Date   HGBA1C 7.4 (A) 06/22/2018   HGBA1C 7.7 03/18/2018   HGBA1C 7.9 12/17/2017   Pt is on a regimen of: - Lantus 8 >> 7 units in a.m. and 7 >> 6 units at bedtime. She could not afford T[place]iba - Novolog:  Snack: 1 unit B'fast: 2-3 units if sugars <150, 3-4 units if sugars >150 Lunch: 2-3 units if sugars <150, 3-4 units if sugars >150 Dinner: 3-4 units  Pt.checks her sugars more than 4 times a day with his CGM.  Freestyle libre CGM parameters: - Average: 216 - % active CGM time: 82% of the time - Glucose variability 46.2% (target < or = to 36%) - time in range:  - very low (<54): 5% - low (54-69): 4% - normal range (70-180): 31% - high sugars (181-250): 21% - very high sugars (>250): 39%  CBG 25-75%:  Lowest sugar was  37 (very busy, forgot to eat) >> 40 >> 41; she has hypoglycemia awareness in the 60s. Highest sugar was 400 >> 343.  Has ReliOn meter.  Pt's meals  are: - Breakfast: oatmeal, yoghurt, sausage bisquit - Lunch: salad, soup, sandwiches - Dinner: meat + starch + vegetables - Snacks: yoghurt, peanut crackers, nuts  She is very busy at work and does not have time allotted for lunch.  She grabs lunch to go.  - N no CKD: Lab Results  Component Value Date   BUN 10 09/15/2017   Lab Results  Component Value Date   CREATININE 0.68 09/15/2017   -No HL: Lab Results  Component Value Date   CHOL 157 09/15/2017   HDL 85.70 09/15/2017   LDLCALC 53 09/15/2017   TRIG 95.0 09/15/2017   CHOLHDL 2 09/15/2017   - last eye exam was in 03/2018: No DR -She denies numbness and tingling in her feet  She does have a palpable thyroid.  Last TSH normal: Lab Results  Component Value Date   TSH 0.89 09/15/2017   Pt denies: - feeling nodules in neck - hoarseness - dysphagia - choking - SOB with lying down  ROS: Constitutional: no weight gain/no weight loss, no fatigue, no subjective hyperthermia, no subjective hypothermia Eyes: no blurry vision, no xerophthalmia ENT: no sore throat, + see HPI Cardiovascular: no CP/no SOB/no palpitations/no leg swelling Respiratory: no cough/no SOB/no wheezing Gastrointestinal: no N/no V/no D/no C/no acid reflux Musculoskeletal: no muscle aches/no joint aches Skin: no  rashes, no hair loss Neurological: no tremors/no numbness/no tingling/no dizziness  I reviewed pt's medications, allergies, PMH, social hx, family hx, and changes were documented in the history of present illness. Otherwise, unchanged from my initial visit note.  Past Medical History:  Diagnosis Date  . Alcoholic pancreatitis   . Asthma    Has albuterol inhaler  . Diabetes mellitus without complication ([place])    Type 2 diagnosed 2013   Past Surgical History:  Procedure Laterality Date  . COLONOSCOPY WITH PROPOFOL N/A 02/11/2018   Procedure: COLONOSCOPY WITH PROPOFOL;  Surgeon: Lin Landsman, MD;  Location: [facility] ENDOSCOPY;   Service: Gastroenterology;  Laterality: N/A;  . NO PAST SURGERIES     History   Social History  . Marital Status: Single    Spouse Name: N/A    Number of Children: 0  . Years of Education: 14   Occupational History  . Take Out Specialist     [employer]   Social History Main Topics  . Smoking status: Current Every Day Smoker -- 0.50 packs/day for 25 years    Types: Cigarettes  . Smokeless tobacco: Never Used  . Alcohol Use: 1.8 oz/week    3 Cans of beer per week  . Drug Use: No  . Sexual Activity: Yes    Birth Control/ Protection: Condom   Social History Narrative   Lassie was born in [place], [place]. She moved with her family to [place] at age 89. She attended 2 years at [employer] and was majoring in [place]. Her goal is to become a Optometrist in the [employer]. 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 Bich lives with her boyfriend, [name]. They are considering getting married soon. She enjoys sketching outdoor scenery, cartoons and loves to read.   Current Outpatient Medications on File Prior to Visit  Medication Sig Dispense Refill  . albuterol (PROAIR HFA) 108 (90 Base) MCG/ACT inhaler Inhale 2 puffs into the lungs every 6 (six) hours as needed for wheezing or shortness of breath. 1 Inhaler 1  . amoxicillin (AMOXIL) 500 MG capsule Take 1 capsule (500 mg total) by mouth 3 (three) times daily. 30 capsule 0  . BD INSULIN SYRINGE U/F 30G X 1/2" 0.5 ML MISC USE AS DIRECTED WHEN TAKING INSULIN 100 each 3  . cetirizine (ZYRTEC) 10 MG tablet Take 10 mg by mouth daily as needed for allergies.     . Continuous Blood Gluc Sensor (FREESTYLE LIBRE 14 DAY SENSOR) MISC 1 each by Does not apply route every 14 (fourteen) days. 6 each 3  . fluconazole (DIFLUCAN) 150 MG tablet Take one tablet PO once. If continue to have symptoms, may take one tablet PO 3 days later. 2 tablet 3  . glucagon (GLUCAGON EMERGENCY) 1 MG injection Inject 1 mg into the muscle  once as needed for up to 1 dose. 1 each 12  . hydrocortisone (ANUSOL-HC) 25 MG suppository Place 1 suppository (25 mg total) rectally 2 (two) times daily. 12 suppository 0  . insulin aspart (NOVOLOG FLEXPEN) 100 UNIT/ML FlexPen Inject 3-4 Units into the skin 3 (three) times daily with meals. 5 pen 3  . insulin glargine (LANTUS) 100 UNIT/ML injection INJECT 13 UNITS INTO THE SKIN as advised 10 mL 11  . Insulin Pen Needle (B-D UF III MINI PEN NEEDLES) 31G X 5 MM MISC USE WHEN TAKING INSULIN. 100 each 5  . Insulin Syringe-Needle U-100 (INSULIN SYRINGE .5CC/30GX5/16") 30G X 5/16" 0.5 ML MISC  Use as directed when taking insulin. 100 each 11  . triamcinolone ointment (KENALOG) 0.1 % Apply 1 application topically 2 (two) times daily. 80 g 2  . varenicline (CHANTIX STARTING MONTH PAK) 0.5 MG X 11 & 1 MG X 42 tablet Take one 0.5 mg tablet by mouth once daily for 3 days, then increase to one 0.5 mg tablet twice daily for 4 days, then increase to one 1 mg tablet twice daily. 53 tablet 0   No current facility-administered medications on file prior to visit.    No Known Allergies Family History  Problem Relation Age of Onset  . Diabetes Father   . Depression Paternal Grandmother   . Depression Paternal Grandfather   . Depression Cousin   . Breast cancer Neg Hx    PE: BP 130/80   Pulse 97   Ht '5\' 3"'  (1.6 m)   Wt 127 lb (57.6 kg)   SpO2 99%   BMI 22.50 kg/m  Wt Readings from Last 3 Encounters:  02/15/19 127 lb (57.6 kg)  11/08/18 123 lb 9.6 oz (56.1 kg)  08/30/18 124 lb 6 oz (56.4 kg)   Constitutional: normal weight, in NAD Eyes: PERRLA, EOMI, no exophthalmos ENT: moist mucous membranes, no thyromegaly, but palpable, symmetric,  thyroid, no cervical lymphadenopathy Cardiovascular: tachycardia, RR, No MRG Respiratory: CTA B Gastrointestinal: abdomen soft, NT, ND, BS+ Musculoskeletal: no deformities, strength intact in all 4 Skin: moist, warm, no rashes Neurological: no tremor with  outstretched hands, DTR normal in all 4  ASSESSMENT: 1. DM, insulin-dependent, uncontrolled, with complications - hyper- and hypo-glycemia - DKA  Component     Latest Ref Rng & Units 11/04/2016  Hemoglobin A1C      9.0  C-Peptide     0.80 - 3.85 ng/mL <0.10 (L)  Glucose, Fasting     65 - 99 mg/dL 40 (L)  Glutamic Acid Decarb Ab     <5 IU/mL >250 (H)  Pancreatic Islet Cell Antibody     <5 JDF Units <5   Glucose very low at the time of the blood draw, therefore, C-peptide is not interpretable. However, GAD antibodies are undetectably high, confirming type 1 diabetes.  2.  Palpable thyroid  PLAN:  1. Patient with type 1 diabetes, on basal-bolus insulin regimen, with improved HbA1c at last visit.  At that time, we adjusted her insulin doses.  We tried to use [place] in the past but this was not covered by her insurance.  At last visit, sugars were decreasing after breakfast and then increasing in the afternoon.  However, they were dropping before dinner and increasing precipitously after dinner.  We decrease Lantus dose as she was decreasing her sugars after injecting it.  She was not always bolusing before meals and I strongly advised her to do so.  I also advised her to only inject the higher dose before dinner if sugars are higher than 150.  Also, we decreased the insulin dose with a snack.  We discussed that she may need an insulin pen that can inject 0.5 unit increments in the future. -At this visit, her sugars are much higher than before.  She is not bolusing enough for meals, only using 1 to 2 units for breakfast and lunch as she is occasionally dropping her sugars after meals if she boluses more.  However, I believe that this is due to her increasing the dose of Lantus to 7 units twice a day or even 7 units in a.m. and 8 units in  p.m., which is not allowing her more mealtime insulin.  At this visit, we discussed about decreasing the dose of Lantus even more in the morning so she can  bolus for breakfast and I also advised her to start bolusing for coffee.  If she has coffee close to a meal, she may just add 1 unit of insulin to that meal. -As of now, her sugars are very high at night in the 200s and 300s and they are dropping if she uses more Lantus at bedtime.  Therefore, we will reduce the dose at night, as discussed.  She is now using 4 to 5 units of NovoLog before dinner, with very variable sugars in the morning.  We will keep this dose for now since I hope that improving sugars earlier in the day will allow her to end up the day with better sugars. -  I suggested to:  Patient Instructions  Please decrease: - Lantus 6 units in a.m. and 6 units at bedtime - Novolog:  Snack and coffee: 1 unit B'fast: 2-3 units if sugars <150, 3-4 units if sugars >150 Lunch: 2-3 units if sugars <150, 3-4 units if sugars >150 Dinner: 4-5 units  Please come back for a follow-up appointment in 3-4 months.  - today, HbA1c is 9.1% (much higher) - continue checking sugars at different times of the day - check >4x a day, rotating checks - advised for yearly eye exams >> she is UTD - Return to clinic in 3-4 mo with sugar log   2.  Palpable thyroid -Symmetric, nonnodular -She denies neck compression symptoms -Reviewed latest TSH, which was normal -We will continue to follow her clinically   Philemon Kingdom, MD PhD [facility] Endocrinology

## 2019-02-21 ENCOUNTER — Telehealth: Payer: Self-pay | Admitting: Internal Medicine

## 2019-02-21 DIAGNOSIS — B379 Candidiasis, unspecified: Secondary | ICD-10-CM

## 2019-02-21 MED ORDER — FLUCONAZOLE 150 MG PO TABS: ORAL_TABLET | ORAL | 3 refills | Status: DC

## 2019-02-21 MED ORDER — FREESTYLE LIBRE 14 DAY SENSOR MISC: 1.0000 | 3 refills | Status: DC

## 2019-02-21 NOTE — Telephone Encounter (Signed)
Both RX sent

## 2019-02-21 NOTE — Telephone Encounter (Signed)
MEDICATION: Continuous Blood Gluc Sensor (FREESTYLE LIBRE 14 DAY SENSOR) MISC RX for Yeast infection  PHARMACY:  [facility]  IS THIS A 90 DAY SUPPLY :   IS PATIENT OUT OF MEDICATION:   IF NOT; HOW MUCH IS LEFT: on last sensor  LAST APPOINTMENT DATE: @3 /09/2019  NEXT APPOINTMENT DATE:@7 /06/2019  DO WE HAVE YOUR PERMISSION TO LEAVE A DETAILED MESSAGE:  OTHER COMMENTS:  Patient is unsure of the name of RX that is for her yeast infection but she needs it filled.  **Let patient know to contact pharmacy at the end of the day to make sure medication is ready. **  ** Please notify patient to allow 48-72 hours to process**  **Encourage patient to contact the pharmacy for refills or they can request refills through [facility]**

## 2019-03-09 ENCOUNTER — Telehealth: Payer: Self-pay | Admitting: Family Medicine

## 2019-03-09 NOTE — Telephone Encounter (Signed)
Patient needs follow up appt scheduled here for follow up or CPE  Sees endocrine for diabetes  Due for:  Foot exam  Urine microalbumin  Fasting labs

## 2019-03-11 NOTE — Telephone Encounter (Signed)
Patient scheduled for 05/09/19.

## 2019-05-09 ENCOUNTER — Ambulatory Visit: Payer: BLUE CROSS/BLUE SHIELD | Admitting: Family

## 2019-05-16 ENCOUNTER — Ambulatory Visit (INDEPENDENT_AMBULATORY_CARE_PROVIDER_SITE_OTHER): Payer: BLUE CROSS/BLUE SHIELD | Admitting: Family

## 2019-05-16 ENCOUNTER — Encounter: Payer: Self-pay | Admitting: Family

## 2019-05-16 ENCOUNTER — Other Ambulatory Visit: Payer: Self-pay

## 2019-05-16 DIAGNOSIS — M25539 Pain in unspecified wrist: Secondary | ICD-10-CM | POA: Insufficient documentation

## 2019-05-16 DIAGNOSIS — E1065 Type 1 diabetes mellitus with hyperglycemia: Secondary | ICD-10-CM

## 2019-05-16 DIAGNOSIS — B379 Candidiasis, unspecified: Secondary | ICD-10-CM

## 2019-05-16 DIAGNOSIS — J45909 Unspecified asthma, uncomplicated: Secondary | ICD-10-CM

## 2019-05-16 DIAGNOSIS — F17210 Nicotine dependence, cigarettes, uncomplicated: Secondary | ICD-10-CM | POA: Diagnosis not present

## 2019-05-16 DIAGNOSIS — N6320 Unspecified lump in the left breast, unspecified quadrant: Secondary | ICD-10-CM | POA: Diagnosis not present

## 2019-05-16 MED ORDER — VARENICLINE TARTRATE 0.5 MG X 11 & 1 MG X 42 PO MISC
ORAL | 0 refills | Status: DC
Start: 2019-05-16 — End: 2019-08-10

## 2019-05-16 MED ORDER — FLUCONAZOLE 150 MG PO TABS: ORAL_TABLET | ORAL | 3 refills | Status: DC

## 2019-05-16 NOTE — Assessment & Plan Note (Signed)
Trial of chantix as didn't try on the past. Instructions given to patient.

## 2019-05-16 NOTE — Assessment & Plan Note (Signed)
Symptoms consistent with yeast infection.  Education provided on controlling blood sugars to prevent recurrence.  Patient verbalized understanding

## 2019-05-16 NOTE — Progress Notes (Signed)
This visit type was conducted due to national recommendations for restrictions regarding the COVID-19 pandemic (e.g. social distancing).  This format is felt to be most appropriate for this patient at this time.  All issues noted in this document were discussed and addressed.  No physical exam was performed (except for noted visual exam findings with Video Visits). Virtual Visit via Video Note  I connected with@  on 05/16/19 at  3:30 PM EDT by a video enabled telemedicine application and verified that I am speaking with the correct person using two identifiers.  Location patient: home Location provider:work Persons participating in the virtual visit: patient, provider  I discussed the limitations of evaluation and management by telemedicine and the availability of in person appointments. The patient expressed understanding and agreed to proceed.  This visit type was conducted due to national recommendations for restrictions regarding the COVID-19 pandemic (e.g. social distancing).  This format is felt to be most appropriate for this patient at this time.  All issues noted in this document were discussed and addressed.  No physical exam was performed (except for noted visual exam findings with Video Visits).  Interactive audio and video telecommunications were attempted between this provider and patient, however failed, due to patient having technical difficulties or patient did not have access to video capability.  We continued and completed visit with audio only.    HPI:  Complains of BL wrist pain, noticed in the morning x 2 weeks, sudden onset. Sharp pain. No swelling. No injury. No numbness, weakness, . No pain in fingers, elbow. No repetitive motions.NO pain with dorsiflexion.   Notes a lot of 'bagging' food at [employer].  Right handed.   Pain with brushing teeth, lifting.  Right shoulder pain after fall 11/2018; shoulder is '100 percent' improved.   DM- follows with [facility]-  Good control. NO sob, wheezing, cough. hasnt needed inhaler recently.   Thick itchy white vaginal discharge.   Would like refill of chantix. Never picked up. Ready to quit smoking. No h/o depression. No hi/si.    ROS: See pertinent positives and negatives per HPI.  Past Medical History:  Diagnosis Date  . Alcoholic pancreatitis   . Asthma    Has albuterol inhaler  . Diabetes mellitus without complication ([place])    Type 2 diagnosed 2013    Past Surgical History:  Procedure Laterality Date  . COLONOSCOPY WITH PROPOFOL N/A 02/11/2018   Procedure: COLONOSCOPY WITH PROPOFOL;  Surgeon: Lin Landsman, MD;  Location: [facility] ENDOSCOPY;  Service: Gastroenterology;  Laterality: N/A;  . NO PAST SURGERIES      Family History  Problem Relation Age of Onset  . Diabetes Father   . Depression Paternal Grandmother   . Depression Paternal Grandfather   . Depression Cousin   . Breast cancer Neg Hx   . Arthritis/Rheumatoid Neg Hx     SOCIAL HX: smoker   Current Outpatient Medications:  .  albuterol (PROAIR HFA) 108 (90 Base) MCG/ACT inhaler, Inhale 2 puffs into the lungs every 6 (six) hours as needed for wheezing or shortness of breath., Disp: 1 Inhaler, Rfl: 1 .  BD INSULIN SYRINGE U/F 30G X 1/2" 0.5 ML MISC, USE AS DIRECTED WHEN TAKING INSULIN, Disp: 100 each, Rfl: 3 .  cetirizine (ZYRTEC) 10 MG tablet, Take 10 mg by mouth daily as needed for allergies. , Disp: , Rfl:  .  Continuous Blood Gluc Sensor (FREESTYLE LIBRE 14 DAY SENSOR) MISC, 1 each by Does not apply route every 14 (fourteen)  days., Disp: 6 each, Rfl: 3 .  fluconazole (DIFLUCAN) 150 MG tablet, Take one tablet PO once. If continue to have symptoms, may take one tablet PO 3 days later., Disp: 2 tablet, Rfl: 3 .  glucagon (GLUCAGON EMERGENCY) 1 MG injection, Inject 1 mg into the muscle once as needed for up to 1 dose., Disp: 1 each, Rfl: 12 .  hydrocortisone (ANUSOL-HC) 25 MG suppository, Place 1 suppository (25 mg total) rectally  2 (two) times daily., Disp: 12 suppository, Rfl: 0 .  insulin aspart (NOVOLOG FLEXPEN) 100 UNIT/ML FlexPen, Inject 3-4 Units into the skin 3 (three) times daily with meals., Disp: 5 pen, Rfl: 3 .  insulin glargine (LANTUS) 100 UNIT/ML injection, INJECT 13 UNITS INTO THE SKIN as advised, Disp: 10 mL, Rfl: 11 .  Insulin Pen Needle (B-D UF III MINI PEN NEEDLES) 31G X 5 MM MISC, USE WHEN TAKING INSULIN., Disp: 100 each, Rfl: 5 .  Insulin Syringe-Needle U-100 (INSULIN SYRINGE .5CC/30GX5/16") 30G X 5/16" 0.5 ML MISC, Use as directed when taking insulin., Disp: 100 each, Rfl: 11 .  triamcinolone ointment (KENALOG) 0.1 %, Apply 1 application topically 2 (two) times daily., Disp: 80 g, Rfl: 2 .  varenicline (CHANTIX STARTING MONTH PAK) 0.5 MG X 11 & 1 MG X 42 tablet, Take one 0.5 mg tablet by mouth once daily for 3 days, then increase to one 0.5 mg tablet twice daily for 4 days, then increase to one 1 mg tablet twice daily., Disp: 53 tablet, Rfl: 0   ASSESSMENT AND PLAN:  Discussed the following assessment and plan:  Acute wrist pain, unspecified laterality  Cigarette nicotine dependence without complication - Plan: varenicline (CHANTIX STARTING MONTH PAK) 0.5 MG X 11 & 1 MG X 42 tablet  Yeast infection - Plan: fluconazole (DIFLUCAN) 150 MG tablet  Left breast mass - Plan: US BREAST LTD UNI LEFT INC AXILLA  Uncomplicated asthma, unspecified asthma severity, unspecified whether persistent  Uncontrolled type 1 diabetes mellitus with hyperglycemia, with long-term current use of insulin ([place])  Problem List Items Addressed This Visit      Respiratory   Asthma    Stable.  Will follow        Endocrine   Uncontrolled type 1 diabetes mellitus with hyperglycemia, with long-term current use of insulin ([place])    Follows with endocrine, will follow        Other   Nicotine dependence    Trial of chantix as didn't try on the past. Instructions given to patient.       Relevant Medications    varenicline (CHANTIX STARTING MONTH PAK) 0.5 MG X 11 & 1 MG X 42 tablet   Yeast infection    Symptoms consistent with yeast infection.  Education provided on controlling blood sugars to prevent recurrence.  Patient verbalized understanding      Relevant Medications   fluconazole (DIFLUCAN) 150 MG tablet   Left breast mass    Overdue for follow-up ultrasound.  I have ordered.  Patient will  let me know if she does not hear from [place] in regards to this image.  She understands she is due for her regular mammogram in September of this year      Relevant Orders   US BREAST LTD UNI LEFT INC AXILLA   Acute wrist pain - Primary    Acute.  Suspect from overuse at work. advised conservative therapy including over-the-counter ibuprofen, icing regimen.  If No improvement patient will let me know and we will pursue  imaging, orthopedics consult, autoimmune testing.             I discussed the assessment and treatment plan with the patient. The patient was provided an opportunity to ask questions and all were answered. The patient agreed with the plan and demonstrated an understanding of the instructions.   The patient was advised to call back or seek an in-person evaluation if the symptoms worsen or if the condition fails to improve as anticipated.   Mable Paris, FNP  I spent 25 min non face to face w/ pt.

## 2019-05-16 NOTE — Assessment & Plan Note (Addendum)
Acute.  Suspect from overuse at work. advised conservative therapy including over-the-counter ibuprofen, icing regimen.  If No improvement patient will let me know and we will pursue imaging, orthopedics consult, autoimmune testing.

## 2019-05-16 NOTE — Assessment & Plan Note (Signed)
Follows with endocrine , will follow 

## 2019-05-16 NOTE — Patient Instructions (Addendum)
Icing to wrist.  Ibuprofen with food daily  If no improvement, please let me know  Diflucan for suspect yeast infection.   Ultrasound of left breast; we are due for this Feb 2020. Call the office in ONE week if you do not hear from [place] in regard to an appointment.   You will be due to your regular mammogram 08/2019. Ensure that you schedule this as well when it comes time. Let me know if ANY issues in doing so.   The below is how you take Chantix.  Please tell family , close that you are starting Chantix so they can not only support you however also make sure you do not show any unsual thoughts, behavior- this is rare however I want you to be vigilant.    Make a plan to slowly decrease smoking as you are on the chantix until your quit date. ( see below for detailed instructions).    Be mindful of  common side effects- mainly GI upset, so please TAKE with FOOD. You may also have strange dreams, however this too is less common.   Select a quit date within 7 days of starting Chantix. Goal is you should stop smoking within 8 to 35 days of starting chantix  Initial: Days 1 to 3: 0.5 mg by mouth once daily Days 4 to 7: 0.5 mg by mouth twice daily   Maintenance (? Day 8): 1 mg by mouth twice daily for 11 weeks. We may consider a temporary or permanent dose reduction if usual dose of 1 mg twice per day is not tolerated.   Slow decrease of smoking:   If you are not able or willing to quit abruptly, begin treatment with vareniciline ( Chantix) and reduce smoking by 50% from baseline within the first 4 weeks, by an additional 50% in the next 4 weeks, and continue reducing with the goal of complete abstinence by 12 weeks. If successfully quits smoking at the end of the 12 weeks, may continue for another 12 weeks to help maintain success. If you are motivated to quit and do not succeed in stopping smoking during prior therapy, or relapse after treatment, I would encourage you to make another attempt  with varenicline ( Chantix) once factors contributing to the failed attempt have been identified and addressed.

## 2019-05-16 NOTE — Assessment & Plan Note (Signed)
Overdue for follow-up ultrasound.  I have ordered.  Patient will  let me know if she does not hear from [place] in regards to this image.  She understands she is due for her regular mammogram in September of this year

## 2019-05-16 NOTE — Assessment & Plan Note (Signed)
Stable. Will follow.  

## 2019-06-02 ENCOUNTER — Other Ambulatory Visit: Payer: BLUE CROSS/BLUE SHIELD

## 2019-06-13 ENCOUNTER — Telehealth: Payer: Self-pay | Admitting: Nutrition

## 2019-06-13 NOTE — Telephone Encounter (Signed)
Message left on machine to call me to discuss linking her Elenor Legato readings with our system.  Telephone number given.

## 2019-06-14 ENCOUNTER — Ambulatory Visit: Payer: BLUE CROSS/BLUE SHIELD | Admitting: Internal Medicine

## 2019-06-14 ENCOUNTER — Encounter: Payer: Self-pay | Admitting: Internal Medicine

## 2019-06-14 ENCOUNTER — Other Ambulatory Visit: Payer: Self-pay

## 2019-06-14 VITALS — BP 118/70 | HR 109 | Ht 63.0 in | Wt 127.0 lb

## 2019-06-14 DIAGNOSIS — E1065 Type 1 diabetes mellitus with hyperglycemia: Secondary | ICD-10-CM

## 2019-06-14 LAB — POCT GLYCOSYLATED HEMOGLOBIN (HGB A1C): Hemoglobin A1C: 8.5 % — AB (ref 4.0–5.6)

## 2019-06-14 NOTE — Patient Instructions (Addendum)
Please use: - Lantus 6 (-7) units in a.m. and 6 units at bedtime - Novolog:  Snack and coffee: 1 unit B'fast: 3 units if sugars <150, 4 units if sugars >150 Lunch: 3 units if sugars <150, 4 units if sugars >150 Dinner: 4-5 units  Try to bolus 15-30 min before a meal.  Please come back for a follow-up appointment in 3-4 months.

## 2019-06-14 NOTE — Progress Notes (Addendum)
Patient ID: Mary Haney, female   DOB: June 23, 1971, 48 y.o.   MRN: [id number]  HPI: Mary Haney is a 48 y.o.-year-old female, returning for f/u for DM, dx 2013, insulin-dependent, uncontrolled, without long term complications. Last visit was 4 months ago. PCP: Dr. Caryl Haney  Reviewed and addended history: In 07/2012 she went to the ED because she was not feeling well. She was found to have a blood glucose level of 600 + and was admitted with DKA. She was followed by Dr. Gabriel Haney immediately following initial diagnosis, then switched to [facility] endocrinology.   In 10/2013, due to lack of finances, she was stretching the insulin doses >> sugars high >> low CBG 25-26 >> ED.    She was admitted in 04/2016 for DKA.  At the end of 2017, she met the DM educator and nutritionist >> decided for Omnipod pump >> but nobody called her back >> this year cannot afford it.  She decided against an insulin pump.  She is active at work >> sometimes misses the insulin with lunch.  Reviewed HbA1c levels: Lab Results  Component Value Date   HGBA1C 9.1 (A) 02/15/2019   HGBA1C 7.4 (A) 06/22/2018   HGBA1C 7.7 03/18/2018   Pt is on a regimen of: - Lantus 6 units in a.m. and 6 units at bedtime - Novolog:  Snack and coffee: 1 unit B'fast: 2-3 units if sugars <150, 3-4 units if sugars >150 Lunch: 1-3 units if sugars <150, 3-4 units if sugars >150 Dinner: 4-5 units  Pt.checks her sugars more than 4 times daily with her CGM (we will scan the reports)  Freestyle libre CGM parameters: - Average: 216 >> 193 - % active CGM time: 82% >> 83% of the time - Glucose variability 46.2% >> 47.8% (target < or = to 36%) - time in range:  - very low (<54): 5% >> 6% - low (54-69): 4% >> 4% - normal range (70-180): 31% >> 37% - high sugars (181-250): 21% >> 25% - very high sugars (>250): 39% >> 28%  CBG 25-75%:   Previously:  Lowest sugar was  37 (very busy, forgot to eat) >> 40 >> 41; she has hypoglycemia  awareness in the 60s. Highest sugar was 400 >> 343.  Has ReliOn meter.  Pt's meals are: - Breakfast: oatmeal, yoghurt, sausage bisquit - Lunch: salad, soup, sandwiches - Dinner: meat + starch + vegetables - Snacks: yoghurt, peanut crackers, nuts  She is very busy at work and does not have time allotted for lunch.  She grabs lunch to go.  -No CKD: Lab Results  Component Value Date   BUN 10 09/15/2017   Lab Results  Component Value Date   CREATININE 0.68 09/15/2017   -No HL: Lab Results  Component Value Date   CHOL 157 09/15/2017   HDL 85.70 09/15/2017   LDLCALC 53 09/15/2017   TRIG 95.0 09/15/2017   CHOLHDL 2 09/15/2017   - last eye exam was in 03/2018: No DR - no numbness and tingling in her feet  She does have a palpable thyroid.  Last TSH normal: Lab Results  Component Value Date   TSH 0.89 09/15/2017   Pt denies: - feeling nodules in neck - hoarseness - dysphagia - choking - SOB with lying down  ROS: Constitutional: no weight gain/no weight loss, no fatigue, no subjective hyperthermia, no subjective hypothermia Eyes: no blurry vision, no xerophthalmia ENT: no sore throat, + see HPI Cardiovascular: no CP/no SOB/no palpitations/no leg swelling Respiratory: no cough/no SOB/no  wheezing Gastrointestinal: no N/no V/no D/no C/no acid reflux Musculoskeletal: no muscle aches/no joint aches Skin: no rashes, no hair loss Neurological: no tremors/no numbness/no tingling/no dizziness  I reviewed pt's medications, allergies, PMH, social hx, family hx, and changes were documented in the history of present illness. Otherwise, unchanged from my initial visit note.  Past Medical History:  Diagnosis Date  . Alcoholic pancreatitis   . Asthma    Has albuterol inhaler  . Diabetes mellitus without complication ([place])    Type 2 diagnosed 2013   Past Surgical History:  Procedure Laterality Date  . COLONOSCOPY WITH PROPOFOL N/A 02/11/2018   Procedure: COLONOSCOPY WITH  PROPOFOL;  Surgeon: Mary Landsman, MD;  Location: [facility] ENDOSCOPY;  Service: Gastroenterology;  Laterality: N/A;  . NO PAST SURGERIES     History   Social History  . Marital Status: Single    Spouse Name: N/A    Number of Children: 0  . Years of Education: 14   Occupational History  . Take Out Specialist     [employer]   Social History Main Topics  . Smoking status: Current Every Day Smoker -- 0.50 packs/day for 25 years    Types: Cigarettes  . Smokeless tobacco: Never Used  . Alcohol Use: 1.8 oz/week    3 Cans of beer per week  . Drug Use: No  . Sexual Activity: Yes    Birth Control/ Protection: Condom   Social History Narrative   Mary Haney was born in [place], [place]. She moved with her family to [place] at age 70. She attended 2 years at [employer] and was majoring in [place]. Her goal is to become a Optometrist in the [employer]. 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 Ladell lives with her boyfriend, Mary Haney. They are considering getting married soon. She enjoys sketching outdoor scenery, cartoons and loves to read.   Current Outpatient Medications on File Prior to Visit  Medication Sig Dispense Refill  . albuterol (PROAIR HFA) 108 (90 Base) MCG/ACT inhaler Inhale 2 puffs into the lungs every 6 (six) hours as needed for wheezing or shortness of breath. 1 Inhaler 1  . BD INSULIN SYRINGE U/F 30G X 1/2" 0.5 ML MISC USE AS DIRECTED WHEN TAKING INSULIN 100 each 3  . cetirizine (ZYRTEC) 10 MG tablet Take 10 mg by mouth daily as needed for allergies.     . Continuous Blood Gluc Sensor (FREESTYLE LIBRE 14 DAY SENSOR) MISC 1 each by Does not apply route every 14 (fourteen) days. 6 each 3  . fluconazole (DIFLUCAN) 150 MG tablet Take one tablet PO once. If continue to have symptoms, may take one tablet PO 3 days later. 2 tablet 3  . glucagon (GLUCAGON EMERGENCY) 1 MG injection Inject 1 mg into the muscle once as needed for up to 1 dose. 1 each  12  . hydrocortisone (ANUSOL-HC) 25 MG suppository Place 1 suppository (25 mg total) rectally 2 (two) times daily. 12 suppository 0  . insulin aspart (NOVOLOG FLEXPEN) 100 UNIT/ML FlexPen Inject 3-4 Units into the skin 3 (three) times daily with meals. 5 pen 3  . insulin glargine (LANTUS) 100 UNIT/ML injection INJECT 13 UNITS INTO THE SKIN as advised 10 mL 11  . Insulin Pen Needle (B-D UF III MINI PEN NEEDLES) 31G X 5 MM MISC USE WHEN TAKING INSULIN. 100 each 5  . Insulin Syringe-Needle U-100 (INSULIN SYRINGE .5CC/30GX5/16") 30G X 5/16" 0.5 ML MISC Use as directed when taking  insulin. 100 each 11  . triamcinolone ointment (KENALOG) 0.1 % Apply 1 application topically 2 (two) times daily. 80 g 2  . varenicline (CHANTIX STARTING MONTH PAK) 0.5 MG X 11 & 1 MG X 42 tablet Take one 0.5 mg tablet by mouth once daily for 3 days, then increase to one 0.5 mg tablet twice daily for 4 days, then increase to one 1 mg tablet twice daily. 53 tablet 0   No current facility-administered medications on file prior to visit.    No Known Allergies Family History  Problem Relation Age of Onset  . Diabetes Father   . Depression Paternal Grandmother   . Depression Paternal Grandfather   . Depression Cousin   . Breast cancer Neg Hx   . Arthritis/Rheumatoid Neg Hx    PE: BP 118/70   Pulse (!) 109   Ht '5\' 3"'  (1.6 m)   Wt 127 lb (57.6 kg)   SpO2 99%   BMI 22.50 kg/m  Wt Readings from Last 3 Encounters:  06/14/19 127 lb (57.6 kg)  02/15/19 127 lb (57.6 kg)  11/08/18 123 lb 9.6 oz (56.1 kg)   Constitutional: normal weight, in NAD Eyes: PERRLA, EOMI, no exophthalmos ENT: moist mucous membranes, + slight symmetric thyromegaly, no cervical lymphadenopathy Cardiovascular: Tachycardia, RR, No MRG Respiratory: CTA B Gastrointestinal: abdomen soft, NT, ND, BS+ Musculoskeletal: no deformities, strength intact in all 4 Skin: moist, warm, no rashes Neurological: no tremor with outstretched hands, DTR normal in  all 4  ASSESSMENT: 1. DM, insulin-dependent, uncontrolled, with complications - hyper- and hypo-glycemia - DKA  Component     Latest Ref Rng & Units 11/04/2016  Hemoglobin A1C      9.0  C-Peptide     0.80 - 3.85 ng/mL <0.10 (L)  Glucose, Fasting     65 - 99 mg/dL 40 (L)  Glutamic Acid Decarb Ab     <5 IU/mL >250 (H)  Pancreatic Islet Cell Antibody     <5 JDF Units <5   Glucose very low at the time of the blood draw, therefore, C-peptide is not interpretable. However, GAD antibodies are undetectably high, confirming type 1 diabetes.  2.  Palpable thyroid  PLAN:  1. Patient with type 1 diabetes, on basal-bolus insulin regimen, with worsened HbA1c at last visit.  We tried to add [place] in the past but this was not covered by her insurance.  At last visit, she was not bolusing enough for meals, only using 1 to 2 units before breakfast and lunch as she was occasionally dropping her sugars after meals if she bolused more.  I explained that this is usually a sign of too much basal insulin in the system so we decreased the dose of Lantus.  We continued the twice a day dosing.   -At this visit, sugars remain uncontrolled, with an average that is lower than at last visit, but, per review of the chart, with a pattern of lower blood sugars in the middle of the night which then increased throughout the day.  We again discussed this is a sign of not enough mealtime insulin and it appears that she is still not bolusing enough due to low blood sugars occasionally after meals.  Upon questioning, she is taking the insulin only at the time of the meal, not before.  We discussed that if not separating the insulin bolus from the meal by enough time (15 to 30 minutes), her sugars will initially increase and then she may develop postprandial hyperglycemia.  I explained the concept of black time and the importance of dieting the timing of the insulin injection based on the blood sugar level before the meal.  I  think this may be the main issue for now that impedes her diabetes control.   -Regarding the insulin doses, I think these are ok, but we did discuss that if she moves NovoLog before meals, as discussed, she will most likely be able to inject a little bit more insulin with meals which could lead to better blood sugar control later in the day.  She agrees to try this. -  I suggested to:  Patient Instructions  Please use: - Lantus 6 (-7) units in a.m. and 6 units at bedtime - Novolog:  Snack and coffee: 1 unit B'fast: 3 units if sugars <150, 4 units if sugars >150 Lunch: 3 units if sugars <150, 4 units if sugars >150 Dinner: 4-5 units  Try to bolus 15-30 min before a meal.  Please come back for a follow-up appointment in 3-4 months.  - we checked her HbA1c: 8.5% (improved, but still high) - advised to check sugars at different times of the day - 4x a day, rotating check times - advised for yearly eye exams >> she is not UTD - we would check her annual labs today - return to clinic in 3-4 months  2.  Palpable thyroid -Symmetric, nonnodular -Denies neck compression symptoms -Reviewed latest TSH, which was normal -Follow her clinically.  - time spent with the patient: 30 min, of which >50% was spent in reviewing her CGM downloads (we will scan them), discussing her hypo- and hyper-glycemic episodes, reviewing previous labs and insulin doses and developing a plan to avoid hypo- and hyper-glycemia.   Component     Latest Ref Rng & Units 06/14/2019          Glucose     65 - 99 mg/dL 259 (H)  BUN     7 - 25 mg/dL 11  Creatinine     0.50 - 1.10 mg/dL 0.66  GFR, Est Non African American     > OR = 60 mL/min/1.56m 106  GFR, Est African American     > OR = 60 mL/min/1.727m123  BUN/Creatinine Ratio     6 - 22 (calc) NOT APPLICABLE  Sodium     13750 146 mmol/L 133 (L)  Potassium     3.5 - 5.3 mmol/L 4.2  Chloride     98 - 110 mmol/L 98  CO2     20 - 32 mmol/L 28  Calcium      8.6 - 10.2 mg/dL 9.4  Total Protein     6.1 - 8.1 g/dL 7.4  Albumin MSPROF     3.6 - 5.1 g/dL 4.7  Globulin     1.9 - 3.7 g/dL (calc) 2.7  AG Ratio     1.0 - 2.5 (calc) 1.7  Total Bilirubin     0.2 - 1.2 mg/dL 0.4  Alkaline phosphatase (APISO)     31 - 125 U/L 56  AST     10 - 35 U/L 17  ALT     6 - 29 U/L 12  Cholesterol     0 - 200 mg/dL 159  Triglycerides     0.0 - 149.0 mg/dL 213.0 (H)  HDL Cholesterol     >39.00 mg/dL 71.20  VLDL     0.0 - 40.0 mg/dL 42.6 (H)  Total CHOL/HDL Ratio  2  NonHDL      88.21  Microalb, Ur     0.0 - 1.9 mg/dL 1.4  Creatinine,U     mg/dL 100.7  MICROALB/CREAT RATIO     0.0 - 30.0 mg/g 1.4  Hemoglobin A1C     4.0 - 5.6 % 8.5 (A)  TSH     0.35 - 4.50 uIU/mL 1.76  Direct LDL     mg/dL 59.0   Normal TSH, normal ACR, higher triglycerides.  Pseudohyponatremia.  High glucose.  Philemon Kingdom, MD PhD [facility] Endocrinology

## 2019-06-15 LAB — TSH: TSH: 1.76 u[IU]/mL (ref 0.35–4.50)

## 2019-06-15 LAB — LIPID PANEL
Cholesterol: 159 mg/dL (ref 0–200)
HDL: 71.2 mg/dL (ref >39.00)
NonHDL: 88.21
Total CHOL/HDL Ratio: 2
Triglycerides: 213 mg/dL — ABNORMAL HIGH (ref 0.0–149.0)
VLDL: 42.6 mg/dL — ABNORMAL HIGH (ref 0.0–40.0)

## 2019-06-15 LAB — COMPLETE METABOLIC PANEL WITH GFR
AG Ratio: 1.7 (calc) (ref 1.0–2.5)
ALT: 12 U/L (ref 6–29)
AST: 17 U/L (ref 10–35)
Albumin: 4.7 g/dL (ref 3.6–5.1)
Alkaline phosphatase (APISO): 56 U/L (ref 31–125)
BUN: 11 mg/dL (ref 7–25)
CO2: 28 mmol/L (ref 20–32)
Calcium: 9.4 mg/dL (ref 8.6–10.2)
Chloride: 98 mmol/L (ref 98–110)
Creat: 0.66 mg/dL (ref 0.50–1.10)
GFR, Est African American: 123 mL/min/{1.73_m2} (ref >=60)
GFR, Est Non African American: 106 mL/min/{1.73_m2} (ref >=60)
Globulin: 2.7 g/dL (calc) (ref 1.9–3.7)
Glucose, Bld: 259 mg/dL — ABNORMAL HIGH (ref 65–99)
Potassium: 4.2 mmol/L (ref 3.5–5.3)
Sodium: 133 mmol/L — ABNORMAL LOW (ref 135–146)
Total Bilirubin: 0.4 mg/dL (ref 0.2–1.2)
Total Protein: 7.4 g/dL (ref 6.1–8.1)

## 2019-06-15 LAB — MICROALBUMIN / CREATININE URINE RATIO
Creatinine,U: 100.7 mg/dL
Microalb Creat Ratio: 1.4 mg/g (ref 0.0–30.0)
Microalb, Ur: 1.4 mg/dL (ref 0.0–1.9)

## 2019-06-15 LAB — LDL CHOLESTEROL, DIRECT: Direct LDL: 59 mg/dL

## 2019-08-06 ENCOUNTER — Other Ambulatory Visit: Payer: Self-pay | Admitting: Family

## 2019-08-06 DIAGNOSIS — F17210 Nicotine dependence, cigarettes, uncomplicated: Secondary | ICD-10-CM

## 2019-09-13 ENCOUNTER — Other Ambulatory Visit: Payer: Self-pay

## 2019-09-14 ENCOUNTER — Encounter: Payer: Self-pay | Admitting: Family Medicine

## 2019-09-14 ENCOUNTER — Telehealth: Payer: Self-pay | Admitting: Family Medicine

## 2019-09-14 ENCOUNTER — Other Ambulatory Visit: Payer: Self-pay

## 2019-09-14 ENCOUNTER — Ambulatory Visit (INDEPENDENT_AMBULATORY_CARE_PROVIDER_SITE_OTHER): Payer: BLUE CROSS/BLUE SHIELD | Admitting: Family Medicine

## 2019-09-14 VITALS — BP 140/90 | HR 96 | Temp 97.4°F | Ht 63.0 in | Wt 124.2 lb

## 2019-09-14 DIAGNOSIS — N6320 Unspecified lump in the left breast, unspecified quadrant: Secondary | ICD-10-CM | POA: Diagnosis not present

## 2019-09-14 DIAGNOSIS — N926 Irregular menstruation, unspecified: Secondary | ICD-10-CM | POA: Insufficient documentation

## 2019-09-14 LAB — POCT URINE PREGNANCY: Preg Test, Ur: NEGATIVE

## 2019-09-14 NOTE — Telephone Encounter (Signed)
Please contact the patient let her know that I noticed after she left the office that she is due to have a repeat ultrasound of her left breast.  Once you let her know this we can have Melissa get this scheduled for her.  Thanks.

## 2019-09-14 NOTE — Assessment & Plan Note (Signed)
After the patient left I noted that she had not completed her ultrasound for this issue.  I will have Gae Bon contact the patient to let her know and then we will get her scheduled for her ultrasound.

## 2019-09-14 NOTE — Patient Instructions (Signed)
Nice to see you. We will contact you with your lab results. 

## 2019-09-14 NOTE — Progress Notes (Signed)
  Tommi Rumps, MD Phone: [phone]  Mary Haney is a 48 y.o. female who presents today for same-day visit.  Missed menses: Patient notes her last menstrual cycle was 06/15/2019.  She had 1 day of bleeding a month after that and then earlier this week had one spot of blood.  She notes having taken a negative urine pregnancy test at home.  She has not been trying to get pregnant.  She is sexually active without the use of condoms.  She notes no abdominal pain.  She does note some vaginal dryness over the last year.  She has had some hot flashes that occur at day and night with hot flushing sensation in her face and neck and sweating in her face and neck.  Social History   Tobacco Use  Smoking Status Current Every Day Smoker  . Packs/day: 0.50  . Years: 25.00  . Pack years: 12.50  . Types: Cigarettes  Smokeless Tobacco Never Used  Tobacco Comment   Has Chantix     ROS see history of present illness  Objective  Physical Exam Vitals:   09/14/19 1612  BP: 140/90  Pulse: 96  Temp: (!) 97.4 F (36.3 C)  SpO2: 99%    BP Readings from Last 3 Encounters:  09/14/19 140/90  06/14/19 118/70  02/15/19 130/80   Wt Readings from Last 3 Encounters:  09/14/19 124 lb 3.2 oz (56.3 kg)  06/14/19 127 lb (57.6 kg)  02/15/19 127 lb (57.6 kg)    Physical Exam Constitutional:      General: She is not in acute distress.    Appearance: She is not diaphoretic.  Cardiovascular:     Rate and Rhythm: Normal rate and regular rhythm.     Heart sounds: Normal heart sounds.  Pulmonary:     Effort: Pulmonary effort is normal.     Breath sounds: Normal breath sounds.  Abdominal:     General: Bowel sounds are normal. There is no distension.     Palpations: Abdomen is soft.     Tenderness: There is no abdominal tenderness. There is no guarding or rebound.  Skin:    General: Skin is warm and dry.  Neurological:     Mental Status: She is alert.      Assessment/Plan: Please see  individual problem list.  Missed menses I suspect she is in perimenopause given her missed menstrual cycles and her hot flash symptoms with vaginal dryness as well.  Discussed urine pregnancy test and beta hCG testing today as well as an [place].  We will contact her with the results and then consider further management or evaluation at that time.  Left breast mass After the patient left I noted that she had not completed her ultrasound for this issue.  I will have Gae Bon contact the patient to let her know and then we will get her scheduled for her ultrasound.   Orders Placed This Encounter  Procedures  . B-HCG Quant  . [place]  . POCT urine pregnancy    No orders of the defined types were placed in this encounter.    Tommi Rumps, MD [place]

## 2019-09-14 NOTE — Assessment & Plan Note (Signed)
I suspect she is in perimenopause given her missed menstrual cycles and her hot flash symptoms with vaginal dryness as well.  Discussed urine pregnancy test and beta hCG testing today as well as an [place].  We will contact her with the results and then consider further management or evaluation at that time.

## 2019-09-15 LAB — FOLLICLE STIMULATING HORMONE: FSH: 98.7 m[IU]/mL

## 2019-09-15 LAB — HCG, QUANTITATIVE, PREGNANCY: Quantitative HCG: <0.6 m[IU]/mL

## 2019-09-15 NOTE — Telephone Encounter (Signed)
Lmtcb. [place] for [employer] to advise. Please contact the patient let her know that I noticed after she left the office that she is due to have a repeat ultrasound of her left breast.  Once you let her know this we can have Melissa get this scheduled for her.  Thanks.  Adilyn Humes,cma

## 2019-09-16 ENCOUNTER — Other Ambulatory Visit: Payer: Self-pay

## 2019-09-20 ENCOUNTER — Other Ambulatory Visit: Payer: Self-pay

## 2019-09-20 ENCOUNTER — Encounter: Payer: Self-pay | Admitting: Internal Medicine

## 2019-09-20 ENCOUNTER — Ambulatory Visit: Payer: BLUE CROSS/BLUE SHIELD | Admitting: Internal Medicine

## 2019-09-20 VITALS — BP 150/86 | HR 90 | Ht 63.0 in | Wt 122.0 lb

## 2019-09-20 DIAGNOSIS — E0789 Other specified disorders of thyroid: Secondary | ICD-10-CM | POA: Diagnosis not present

## 2019-09-20 DIAGNOSIS — E1065 Type 1 diabetes mellitus with hyperglycemia: Secondary | ICD-10-CM

## 2019-09-20 LAB — POCT GLYCOSYLATED HEMOGLOBIN (HGB A1C): Hemoglobin A1C: 6.9 % — AB (ref 4.0–5.6)

## 2019-09-20 MED ORDER — INSULIN GLARGINE 100 UNIT/ML ~~LOC~~ SOLN: SUBCUTANEOUS | 11 refills | Status: DC

## 2019-09-20 NOTE — Progress Notes (Signed)
Patient ID: Mary Haney, female   DOB: Feb 09, 1971, 48 y.o.   MRN: [id number]  HPI: Mary Haney is a 48 y.o.-year-old female, returning for f/u for DM, dx 2013, insulin-dependent, uncontrolled, without long term complications. Last visit was 3 months ago. PCP: Dr. Caryl Bis  Reviewed history: In 07/2012 she went to the ED because she was not feeling well. She was found to have a blood glucose level of 600 + and was admitted with DKA. She was followed by [name] immediately following initial diagnosis, then switched to [facility] endocrinology.   In 10/2013, due to lack of finances, she was stretching the insulin doses >> sugars high >> low CBG 25-26 >> ED.    She was admitted in 04/2016 for DKA.  At the end of 2017, she met the DM educator and nutritionist >> decided for Omnipod pump >> but nobody called her back >> this year cannot afford it.  She decided against an insulin pump.  She is active at work >> sometimes misses the insulin with lunch.  Reviewed HbA1c levels: Lab Results  Component Value Date   HGBA1C 8.5 (A) 06/14/2019   HGBA1C 9.1 (A) 02/15/2019   HGBA1C 7.4 (A) 06/22/2018   Pt is on a regimen of: - Lantus 6-7 units in a.m. and 6 units at bedtime - Novolog:  Snack and coffee: 1 unit B'fast: 3 units if sugars <150,  Lunch: 3 >> 2 units Dinner: >> 2-3 units + 1 units with bedtime snack  She checks her sugars more than 4 times a day with her freestyle libre CGM (we will scan the reports).  Freestyle libre CGM parameters: - Average: 216 >> 193 >> 178 - % active CGM time: 82% >> 83% >> 85% of the time - Glucose variability 46.2% >> 47.8% >> 47.6% (target < or = to 36%) - time in range:  - very low (<54): 5% >> 6% >> 5% - low (54-69): 4% >> 4% >> 5% - normal range (70-180): 31% >> 37% >> 41% - high sugars (181-250): 21% >> 25% >> 28% - very high sugars (>250): 39% >> 28% >> 21%  CBG 25-75%:  Previously:   Previously:  Lowest sugar was  37 (very busy,  forgot to eat) >> 40 >> 41 >> 44; she has hypoglycemia awareness in the 60s. Highest sugar was 400 >> 343 >> 300.  Has ReliOn meter.  Pt's meals are: - Breakfast: oatmeal, yoghurt, sausage bisquit - Lunch: salad, soup, sandwiches - Dinner: meat + starch + vegetables - Snacks: yoghurt, peanut crackers, nuts  She is very busy at work and does not have time allotted for lunch.  She continues to grab lunch to go.  -No CKD: Lab Results  Component Value Date   BUN 11 06/14/2019   Lab Results  Component Value Date   CREATININE 0.66 06/14/2019   -No HL: Lab Results  Component Value Date   CHOL 159 06/14/2019   HDL 71.20 06/14/2019   LDLCALC 53 09/15/2017   LDLDIRECT 59.0 06/14/2019   TRIG 213.0 (H) 06/14/2019   CHOLHDL 2 06/14/2019   - last eye exam was in 03/2018: No DR -No numbness and tingling in her feet  She has a palpable thyroid.  Latest TSH reviewed: Lab Results  Component Value Date   TSH 1.76 06/14/2019   Pt denies: - feeling nodules in neck - hoarseness - dysphagia - choking - SOB with lying down  ROS: Constitutional: no weight gain/no weight loss, no fatigue, no subjective  hyperthermia, no subjective hypothermia Eyes: no blurry vision, no xerophthalmia ENT: no sore throat, + see HPI Cardiovascular: no CP/no SOB/no palpitations/no leg swelling Respiratory: no cough/no SOB/no wheezing Gastrointestinal: no N/no V/no D/no C/no acid reflux Musculoskeletal: no muscle aches/no joint aches Skin: no rashes, no hair loss Neurological: no tremors/no numbness/no tingling/no dizziness  I reviewed pt's medications, allergies, PMH, social hx, family hx, and changes were documented in the history of present illness. Otherwise, unchanged from my initial visit note.  Past Medical History:  Diagnosis Date  . Alcoholic pancreatitis   . Asthma    Has albuterol inhaler  . Diabetes mellitus without complication ([place])    Type 2 diagnosed 2013   Past Surgical History:   Procedure Laterality Date  . COLONOSCOPY WITH PROPOFOL N/A 02/11/2018   Procedure: COLONOSCOPY WITH PROPOFOL;  Surgeon: Lin Landsman, MD;  Location[facility] ENDOSCOPY;  Service: Gastroenterology;  Laterality: N/A;  . NO PAST SURGERIES     History   Social History  . Marital Status: Single    Spouse Name: N/A    Number of Children: 0  . Years of Education: 14   Occupational History  . Take Out Specialist     [employer]   Social History Main Topics  . Smoking status: Current Every Day Smoker -- 0.50 packs/day for 25 years    Types: Cigarettes  . Smokeless tobacco: Never Used  . Alcohol Use: 1.8 oz/week    3 Cans of beer per week  . Drug Use: No  . Sexual Activity: Yes    Birth Control/ Protection: Condom   Social History Narrative   Mary Haney was born in [place], [place]. She moved with her family to [place] at age 49. She attended 2 years at [employer] and was majoring in [place]. Her goal is to become a Optometrist in the [employer]. 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 Mary Haney lives with her boyfriend, [name]. They are considering getting married soon. She enjoys sketching outdoor scenery, cartoons and loves to read.   Current Outpatient Medications on File Prior to Visit  Medication Sig Dispense Refill  . albuterol (PROAIR HFA) 108 (90 Base) MCG/ACT inhaler Inhale 2 puffs into the lungs every 6 (six) hours as needed for wheezing or shortness of breath. 1 Inhaler 1  . BD INSULIN SYRINGE U/F 30G X 1/2" 0.5 ML MISC USE AS DIRECTED WHEN TAKING INSULIN 100 each 3  . cetirizine (ZYRTEC) 10 MG tablet Take 10 mg by mouth daily as needed for allergies.     . Continuous Blood Gluc Sensor (FREESTYLE LIBRE 14 DAY SENSOR) MISC 1 each by Does not apply route every 14 (fourteen) days. 6 each 3  . fluconazole (DIFLUCAN) 150 MG tablet Take one tablet PO once. If continue to have symptoms, may take one tablet PO 3 days later. 2 tablet 3  . glucagon  (GLUCAGON EMERGENCY) 1 MG injection Inject 1 mg into the muscle once as needed for up to 1 dose. 1 each 12  . hydrocortisone (ANUSOL-HC) 25 MG suppository Place 1 suppository (25 mg total) rectally 2 (two) times daily. 12 suppository 0  . insulin aspart (NOVOLOG FLEXPEN) 100 UNIT/ML FlexPen Inject 3-4 Units into the skin 3 (three) times daily with meals. 5 pen 3  . insulin glargine (LANTUS) 100 UNIT/ML injection INJECT 13 UNITS INTO THE SKIN as advised 10 mL 11  . Insulin Pen Needle (B-D UF III MINI PEN NEEDLES) 31G X 5  MM MISC USE WHEN TAKING INSULIN. 100 each 5  . Insulin Syringe-Needle U-100 (INSULIN SYRINGE .5CC/30GX5/16") 30G X 5/16" 0.5 ML MISC Use as directed when taking insulin. 100 each 11  . triamcinolone ointment (KENALOG) 0.1 % Apply 1 application topically 2 (two) times daily. 80 g 2  . varenicline (CHANTIX STARTING MONTH PAK) 0.5 MG X 11 & 1 MG X 42 tablet TAKE ONE 0.5 MG TABLET BY MOUTH ONCE DAILY FOR 3 DAYS, THEN INCREASE TO ONE 0.5 MG TABLET TWICE DAILY FOR 4 DAYS, THEN INCREASE TO ONE 1 MG TABLET TWICE DAILY. 53 each 0   No current facility-administered medications on file prior to visit.    No Known Allergies Family History  Problem Relation Age of Onset  . Diabetes Father   . Depression Paternal Grandmother   . Depression Paternal Grandfather   . Depression Cousin   . Breast cancer Neg Hx   . Arthritis/Rheumatoid Neg Hx    PE: BP (!) 150/86   Pulse 90   Ht '5\' 3"'  (1.6 m) Comment: measured  Wt 122 lb (55.3 kg)   SpO2 90%   BMI 21.61 kg/m  Wt Readings from Last 3 Encounters:  09/20/19 122 lb (55.3 kg)  09/14/19 124 lb 3.2 oz (56.3 kg)  06/14/19 127 lb (57.6 kg)   Constitutional:  Normal weight, in NAD Eyes: PERRLA, EOMI, no exophthalmos ENT: moist mucous membranes, + slight, symmetric, thyromegaly, no cervical lymphadenopathy Cardiovascular: RRR, No MRG Respiratory: CTA B Gastrointestinal: abdomen soft, NT, ND, BS+ Musculoskeletal: no deformities, strength  intact in all 4 Skin: moist, warm, no rashes Neurological: no tremor with outstretched hands, DTR normal in all 4  ASSESSMENT: 1. DM, insulin-dependent, uncontrolled, with complications - hyper- and hypo-glycemia - History of DKA  Component     Latest Ref Rng & Units 11/04/2016  Hemoglobin A1C      9.0  C-Peptide     0.80 - 3.85 ng/mL <0.10 (L)  Glucose, Fasting     65 - 99 mg/dL 40 (L)  Glutamic Acid Decarb Ab     <5 IU/mL >250 (H)  Pancreatic Islet Cell Antibody     <5 JDF Units <5   Glucose very low at the time of the blood draw, therefore, C-peptide is not interpretable. However, GAD antibodies are undetectably high, confirming type 1 diabetes.  2.  Palpable thyroid  PLAN:  1. Patient with uncontrolled type 1 diabetes, on basal-bolus insulin regimen with increased insulin sensitivity and on low insulin doses.  Her diabetes control is hindered by increased stress at work and the fact that she is very busy and does not have time allotted to eat.   -At this visit, we reviewed together her CGM download and we identified the following patterns:  Sugars decrease overnight occasionally to 50s and we discussed about decreasing the p.m. Lantus dose during the week, when she is more active during the day.  During the weekend, she can continue with 6 units of Lantus at bedtime.  I feel that the Lantus dose in the morning is adequate  Her sugars increase significantly after breakfast and she is consistently only taking 3 units for this meal.  We will increase this to 40 units but I advised her to keep an eye on her sugars after meals and to increase it if still needed afterwards.  Sugars drop later in the afternoon after she is busier at work and we discussed about injecting less insulin with lunch, decreasing from 2 units to  1 unit in the weekdays but maintaining 2 units during the weekend when she is less active  Sugars increase after dinner but this is not consistent so for now I would  not suggest to change the insulin with this meal -Overall, sugars appear improved >>  HbA1c: 6.9% (much better) -  I suggested to:  Patient Instructions  Please continue: - Lantus 6 units in a.m. and 5-6 units at bedtime (higher dose on Fri and Sat) - Novolog:  Snack and coffee: 1 unit B'fast: 4 units Lunch: 1-2 units Dinner: 2-3 units  Try to bolus 15 to 30 minutes before each meal.  Please come back for a follow-up appointment in 3-4 months.  - advised to check sugars at different times of the day - 4x a day, rotating check times - advised for yearly eye exams >> she is not UTD - refuses flu shot today - return to clinic in 3-4 months  2.  Palpable thyroid -Symmetric, nonnodular -Denies neck compressiNo neck compression symptoms -Latest TSH reviewed and this was normal at last visit: Lab Results  Component Value Date   TSH 1.76 06/14/2019  -We will continue to follow her clinically  - time spent with the patient: 25 min, of which >50% was spent in reviewing her CGM downloads and insulin doses, discussing her hypo- and hyper-glycemic episodes, reviewing previous labs and pump settings and developing a plan to avoid hypo- and hyper-glycemia.   Philemon Kingdom, MD PhD [facility] Endocrinology

## 2019-09-20 NOTE — Addendum Note (Signed)
Addended by: Cardell Peach I on: 09/20/2019 04:41 PM   Modules accepted: Orders

## 2019-09-20 NOTE — Patient Instructions (Addendum)
Please continue: - Lantus 6 units in a.m. and 5-6 units at bedtime (higher dose on Fri and Sat) - Novolog:  Snack and coffee: 1 unit B'fast: 4 units Lunch: 1-2 units Dinner: 2-3 units  Try to bolus 15 to 30 minutes before each meal.  Please come back for a follow-up appointment in 3-4 months.

## 2019-09-25 ENCOUNTER — Other Ambulatory Visit: Payer: Self-pay | Admitting: Internal Medicine

## 2019-12-16 ENCOUNTER — Other Ambulatory Visit: Payer: Self-pay

## 2019-12-16 ENCOUNTER — Encounter: Payer: Self-pay | Admitting: Family

## 2019-12-16 ENCOUNTER — Ambulatory Visit: Payer: BLUE CROSS/BLUE SHIELD | Admitting: Family

## 2019-12-16 ENCOUNTER — Ambulatory Visit (INDEPENDENT_AMBULATORY_CARE_PROVIDER_SITE_OTHER): Payer: BLUE CROSS/BLUE SHIELD | Admitting: Family

## 2019-12-16 VITALS — Ht 63.0 in | Wt 122.0 lb

## 2019-12-16 DIAGNOSIS — R928 Other abnormal and inconclusive findings on diagnostic imaging of breast: Secondary | ICD-10-CM | POA: Insufficient documentation

## 2019-12-16 DIAGNOSIS — N926 Irregular menstruation, unspecified: Secondary | ICD-10-CM

## 2019-12-16 NOTE — Progress Notes (Signed)
Virtual Visit via Video Note  I connected with@  on 12/19/19 at  4:00 PM EST by a video enabled telemedicine application and verified that I am speaking with the correct person using two identifiers.  Location patient: home Location provider:work Persons participating in the virtual visit: patient, provider  I discussed the limitations of evaluation and management by telemedicine and the availability of in person appointments. The patient expressed understanding and agreed to proceed.   HPI: Follow up.  Feels well today, no complaints.  She states that her periods are becoming more erratic which she discussed Dr. Caryl Bis. She has no large clots,  pelvic pain, abdominal distention.   Sister is one year older and going through menopause.    Asthma-well controlled. very rare of albuterol  DM 1-follows with endocrine, Dr. Mayra Neer  Due mammogram.   ROS: See pertinent positives and negatives per HPI.  Past Medical History:  Diagnosis Date  . Alcoholic pancreatitis   . Asthma    Has albuterol inhaler  . Diabetes mellitus without complication ([place])    Type 2 diagnosed 2013    Past Surgical History:  Procedure Laterality Date  . COLONOSCOPY WITH PROPOFOL N/A 02/11/2018   Procedure: COLONOSCOPY WITH PROPOFOL;  Surgeon: Lin Landsman, MD;  Location: [facility] ENDOSCOPY;  Service: Gastroenterology;  Laterality: N/A;  . NO PAST SURGERIES      Family History  Problem Relation Age of Onset  . Diabetes Father   . Depression Paternal Grandmother   . Depression Paternal Grandfather   . Depression Cousin   . Breast cancer Neg Hx   . Arthritis/Rheumatoid Neg Hx     SOCIAL HX: smoker   Current Outpatient Medications:  .  albuterol (PROAIR HFA) 108 (90 Base) MCG/ACT inhaler, Inhale 2 puffs into the lungs every 6 (six) hours as needed for wheezing or shortness of breath., Disp: 1 Inhaler, Rfl: 1 .  BD INSULIN SYRINGE U/F 30G X 1/2" 0.5 ML MISC, USE AS DIRECTED WHEN TAKING INSULIN,  Disp: 100 each, Rfl: 3 .  cetirizine (ZYRTEC) 10 MG tablet, Take 10 mg by mouth daily as needed for allergies. , Disp: , Rfl:  .  Continuous Blood Gluc Sensor (FREESTYLE LIBRE 14 DAY SENSOR) MISC, 1 each by Does not apply route every 14 (fourteen) days., Disp: 6 each, Rfl: 3 .  glucagon (GLUCAGON EMERGENCY) 1 MG injection, Inject 1 mg into the muscle once as needed for up to 1 dose., Disp: 1 each, Rfl: 12 .  hydrocortisone (ANUSOL-HC) 25 MG suppository, Place 1 suppository (25 mg total) rectally 2 (two) times daily., Disp: 12 suppository, Rfl: 0 .  insulin aspart (NOVOLOG FLEXPEN) 100 UNIT/ML FlexPen, Inject 3-4 Units into the skin 3 (three) times daily with meals., Disp: 5 pen, Rfl: 3 .  insulin glargine (LANTUS) 100 UNIT/ML injection, INJECT 13 UNITS INTO THE SKIN as advised, Disp: 10 mL, Rfl: 11 .  Insulin Pen Needle (B-D UF III MINI PEN NEEDLES) 31G X 5 MM MISC, USE WHEN TAKING INSULIN., Disp: 100 each, Rfl: 5 .  Insulin Syringe-Needle U-100 (INSULIN SYRINGE .5CC/30GX5/16") 30G X 5/16" 0.5 ML MISC, Use as directed when taking insulin., Disp: 100 each, Rfl: 11 .  triamcinolone ointment (KENALOG) 0.1 %, Apply 1 application topically 2 (two) times daily., Disp: 80 g, Rfl: 2 .  fluconazole (DIFLUCAN) 150 MG tablet, Take one tablet PO once. If continue to have symptoms, may take one tablet PO 3 days later. (Patient not taking: Reported on 12/16/2019), Disp: 2 tablet, Rfl:  3  EXAM:  VITALS per patient if applicable:  GENERAL: alert, oriented, appears well and in no acute distress  HEENT: atraumatic, conjunttiva clear, no obvious abnormalities on inspection of external nose and ears  NECK: normal movements of the head and neck  LUNGS: on inspection no signs of respiratory distress, breathing rate appears normal, no obvious gross SOB, gasping or wheezing  CV: no obvious cyanosis  MS: moves all visible extremities without noticeable abnormality  PSYCH/NEURO: pleasant and cooperative, no obvious  depression or anxiety, speech and thought processing grossly intact  ASSESSMENT AND PLAN:  Discussed the following assessment and plan: Problem List Items Addressed This Visit      Other   Abnormal mammogram - Primary    Diagnostic mammogram and left breast ultrasound have been ordered since is not done last year.  Patient understands the importance of scheduling and I have given her the phone number to [place] to do so      Relevant Orders   US BREAST LTD UNI LEFT INC AXILLA   Diagnostic- MM DIAG BREAST TOMO BILATERAL   Missed menses    Discussed with patient again today Based on her [facility], it does appear to be an early menopause.  It also seems to coincide with her older sister's menopausal transition who is 69-year-old than her.  She will certainly let me if any questions or concerns arise.         -we discussed possible serious and likely etiologies, options for evaluation and workup, limitations of telemedicine visit vs in person visit, treatment, treatment risks and precautions. Pt prefers to treat via telemedicine empirically rather then risking or undertaking an in person visit at this moment. Patient agrees to seek prompt in person care if worsening, new symptoms arise, or if is not improving with treatment.   I discussed the assessment and treatment plan with the patient. The patient was provided an opportunity to ask questions and all were answered. The patient agreed with the plan and demonstrated an understanding of the instructions.   The patient was advised to call back or seek an in-person evaluation if the symptoms worsen or if the condition fails to improve as anticipated.   Mary Paris, FNP

## 2019-12-16 NOTE — Assessment & Plan Note (Signed)
Diagnostic mammogram and left breast ultrasound have been ordered since is not done last year.  Patient understands the importance of scheduling and I have given her the phone number to [facility] to do so

## 2019-12-16 NOTE — Patient Instructions (Signed)
Please call call and schedule your 3D mammogram as discussed.   [place]  [place], [place]

## 2019-12-19 ENCOUNTER — Encounter: Payer: Self-pay | Admitting: Family

## 2019-12-19 NOTE — Assessment & Plan Note (Signed)
Discussed with patient again today Based on her [facility], it does appear to be an early menopause.  It also seems to coincide with her older sister's menopausal transition who is 48-year-old than her.  She will certainly let me if any questions or concerns arise.

## 2019-12-27 ENCOUNTER — Ambulatory Visit (INDEPENDENT_AMBULATORY_CARE_PROVIDER_SITE_OTHER): Payer: 59 | Admitting: Internal Medicine

## 2019-12-27 ENCOUNTER — Other Ambulatory Visit: Payer: Self-pay

## 2019-12-27 ENCOUNTER — Encounter: Payer: Self-pay | Admitting: Internal Medicine

## 2019-12-27 VITALS — BP 132/80 | HR 100 | Ht 63.0 in | Wt 127.0 lb

## 2019-12-27 DIAGNOSIS — E1065 Type 1 diabetes mellitus with hyperglycemia: Secondary | ICD-10-CM

## 2019-12-27 LAB — POCT GLYCOSYLATED HEMOGLOBIN (HGB A1C): Hemoglobin A1C: 8.7 % — AB (ref 4.0–5.6)

## 2019-12-27 MED ORDER — BD PEN NEEDLE MINI U/F 31G X 5 MM MISC: 3 refills | Status: AC

## 2019-12-27 MED ORDER — "BD INSULIN SYRINGE U/F 30G X 1/2"" 0.5 ML MISC": 3 refills | Status: DC

## 2019-12-27 NOTE — Patient Instructions (Addendum)
Please change: - Lantus 6 units in a.m. and 6-8 units at bedtime-higher doses on Fridays and Saturdays. - Novolog:  Snack and coffee: 2 unit B'fast: 3 units Lunch: 4-5 units Dinner: 5-6 units  Try to bolus 15 to 30 minutes before each meal.  Please schedule an appt with Antonieta Iba with nutrition.  Afetr you see Mickel Baas, try to use an Insulin to carb ratio of 1:12.  Please come back for a follow-up appointment in 3-4 months.   Carbohydrate Counting for Diabetes Mellitus, Adult  Carbohydrate counting is a method of keeping track of how many carbohydrates you eat. Eating carbohydrates naturally increases the amount of sugar (glucose) in the blood. Counting how many carbohydrates you eat helps keep your blood glucose within normal limits, which helps you manage your diabetes (diabetes mellitus). It is important to know how many carbohydrates you can safely have in each meal. This is different for every person. A diet and nutrition specialist (registered dietitian) can help you make a meal plan and calculate how many carbohydrates you should have at each meal and snack. Carbohydrates are found in the following foods:  Grains, such as breads and cereals.  Dried beans and soy products.  Starchy vegetables, such as potatoes, peas, and corn.  Fruit and fruit juices.  Milk and yogurt.  Sweets and snack foods, such as cake, cookies, candy, chips, and soft drinks. How do I count carbohydrates? There are two ways to count carbohydrates in food. You can use either of the methods or a combination of both. Reading "Nutrition Facts" on packaged food The "Nutrition Facts" list is included on the labels of almost all packaged foods and beverages in the [place] It includes:  The serving size.  Information about nutrients in each serving, including the grams (g) of carbohydrate per serving. To use the "Nutrition Facts":  Decide how many servings you will have.  Multiply the number of servings by  the number of carbohydrates per serving.  The resulting number is the total amount of carbohydrates that you will be having. Learning standard serving sizes of other foods When you eat carbohydrate foods that are not packaged or do not include "Nutrition Facts" on the label, you need to measure the servings in order to count the amount of carbohydrates:  Measure the foods that you will eat with a food scale or measuring cup, if needed.  Decide how many standard-size servings you will eat.  Multiply the number of servings by 15. Most carbohydrate-rich foods have about 15 g of carbohydrates per serving. ? For example, if you eat 8 oz (170 g) of strawberries, you will have eaten 2 servings and 30 g of carbohydrates (2 servings x 15 g = 30 g).  For foods that have more than one food mixed, such as soups and casseroles, you must count the carbohydrates in each food that is included. The following list contains standard serving sizes of common carbohydrate-rich foods. Each of these servings has about 15 g of carbohydrates:   hamburger bun or  English muffin.   oz (15 mL) syrup.   oz (14 g) jelly.  1 slice of bread.  1 six-inch tortilla.  3 oz (85 g) cooked rice or pasta.  4 oz (113 g) cooked dried beans.  4 oz (113 g) starchy vegetable, such as peas, corn, or potatoes.  4 oz (113 g) hot cereal.  4 oz (113 g) mashed potatoes or  of a large baked potato.  4 oz (113 g)  canned or frozen fruit.  4 oz (120 mL) fruit juice.  4-6 crackers.  6 chicken nuggets.  6 oz (170 g) unsweetened dry cereal.  6 oz (170 g) plain fat-free yogurt or yogurt sweetened with artificial sweeteners.  8 oz (240 mL) milk.  8 oz (170 g) fresh fruit or one small piece of fruit.  24 oz (680 g) popped popcorn. Example of carbohydrate counting Sample meal  3 oz (85 g) chicken breast.  6 oz (170 g) brown rice.  4 oz (113 g) corn.  8 oz (240 mL) milk.  8 oz (170 g) strawberries with  sugar-free whipped topping. Carbohydrate calculation 1. Identify the foods that contain carbohydrates: ? Rice. ? Corn. ? Milk. ? Strawberries. 2. Calculate how many servings you have of each food: ? 2 servings rice. ? 1 serving corn. ? 1 serving milk. ? 1 serving strawberries. 3. Multiply each number of servings by 15 g: ? 2 servings rice x 15 g = 30 g. ? 1 serving corn x 15 g = 15 g. ? 1 serving milk x 15 g = 15 g. ? 1 serving strawberries x 15 g = 15 g. 4. Add together all of the amounts to find the total grams of carbohydrates eaten: ? 30 g + 15 g + 15 g + 15 g = 75 g of carbohydrates total. Summary  Carbohydrate counting is a method of keeping track of how many carbohydrates you eat.  Eating carbohydrates naturally increases the amount of sugar (glucose) in the blood.  Counting how many carbohydrates you eat helps keep your blood glucose within normal limits, which helps you manage your diabetes.  A diet and nutrition specialist (registered dietitian) can help you make a meal plan and calculate how many carbohydrates you should have at each meal and snack. This information is not intended to replace advice given to you by your health care provider. Make sure you discuss any questions you have with your health care provider. Document Revised: 06/18/2017 Document Reviewed: 05/07/2016 Elsevier Patient Education  [place].

## 2019-12-27 NOTE — Progress Notes (Signed)
Patient ID: Mary Haney, female   DOB: 19-Dec-1970, 49 y.o.   MRN: [id number]  This visit occurred during the SARS-CoV-2 public health emergency.  Safety protocols were in place, including screening questions prior to the visit, additional usage of staff PPE, and extensive cleaning of exam room while observing appropriate contact time as indicated for disinfecting solutions.   HPI: Mary Haney is a 49 y.o.-year-old female, returning for f/u for DM, dx 2013, insulin-dependent, uncontrolled, without long term complications. Last visit was 3 months ago. PCP: Dr. Caryl Bis  She had increased stress since last visit after she broke up with her boyfriend of 12 years.  Her sugars are higher.  Reviewed history: In 07/2012 she went to the ED because she was not feeling well. She was found to have a blood glucose level of 600 + and was admitted with DKA. She was followed by [name] immediately following initial diagnosis, then switched to [facility] endocrinology.   In 10/2013, due to lack of finances, she was stretching the insulin doses >> sugars high >> low CBG 25-26 >> ED.    She was admitted in 04/2016 for DKA.  At the end of 2017, she met the DM educator and nutritionist >> decided for Omnipod pump >> but nobody called her back >> this year cannot afford it.  She decided against an insulin pump.  She is usually very active at work so sugars are lower during the day.  Reviewed her HbA1c levels: Lab Results  Component Value Date   HGBA1C 6.9 (A) 09/20/2019   HGBA1C 8.5 (A) 06/14/2019   HGBA1C 9.1 (A) 02/15/2019   Pt is on a regimen of: - Lantus 6 units in a.m. and 5-6 units at bedtime-higher doses on Fridays and Saturdays. - Novolog:  Snack and coffee: 1 unit >> 2-3 units B'fast: 4 units >> 3 units Lunch: 1 to 2 units >> 3-4 units (she has a late lunch now) Dinner: 2 to 3 units >> 4-5 units (she has a late dinner now)  She checks her sugars more than 4 times a day with her  freestyle libre CGM (we will scan the reports).  Freestyle libre CGM parameters: - Average: 216 >> 193 >> 178 >> 249 - % active CGM time: 82% >> 83% >> 85% >> 87% of the time - Glucose variability 46.2% >> 47.8% >> 47.6% >> 35% (target < or = to 36%) - time in range:  - very low (<54): 5% >> 6% >> 5% >> 0% - low (54-69): 4% >> 4% >> 5% >> 1% - normal range (70-180): 31% >> 37% >> 41% >> 22% - high sugars (181-250): 21% >> 25% >> 28% >> 20% - very high sugars (>250): 39% >> 28% >> 21% >> 49%  CBG 25-75%:  Previously:  Previously:   Lowest sugar was  37 (very busy, forgot to eat) >> 40 >> 41 >> 44 >> 82; she has hypoglycemia awareness in the 60s. Highest sugar was 400 >> 343 >> 300 >> 400s.  Has ReliOn meter.  Pt's meals are: - Breakfast: oatmeal, yoghurt, sausage bisquit - Lunch: salad, soup, sandwiches - Dinner: meat + starch + vegetables - Snacks: yoghurt, peanut crackers, nuts  She is very busy at work and does not have time allotted for lunch.  She continues to grab lunch to go.  -No CKD: Lab Results  Component Value Date   BUN 11 06/14/2019   Lab Results  Component Value Date   CREATININE 0.66 06/14/2019   -+  Dyslipidemia: Lab Results  Component Value Date   CHOL 159 06/14/2019   HDL 71.20 06/14/2019   LDLCALC 53 09/15/2017   LDLDIRECT 59.0 06/14/2019   TRIG 213.0 (H) 06/14/2019   CHOLHDL 2 06/14/2019   - last eye exam was in 03/2018: No DR -She denies numbness and tingling in her feet  Latest TSH was normal: Lab Results  Component Value Date   TSH 1.76 06/14/2019   She has a palpable thyroid.  Pt denies: - feeling nodules in neck - hoarseness - dysphagia - choking - SOB with lying down  ROS: Constitutional: no weight gain/no weight loss, no fatigue, no subjective hyperthermia, no subjective hypothermia Eyes: no blurry vision, no xerophthalmia ENT: no sore throat, + see HPI Cardiovascular: no CP/no SOB/no palpitations/no leg  swelling Respiratory: no cough/no SOB/no wheezing Gastrointestinal: no N/no V/no D/no C/no acid reflux Musculoskeletal: no muscle aches/no joint aches Skin: no rashes, no hair loss Neurological: no tremors/no numbness/no tingling/no dizziness  I reviewed pt's medications, allergies, PMH, social hx, family hx, and changes were documented in the history of present illness. Otherwise, unchanged from my initial visit note.  Past Medical History:  Diagnosis Date  . Alcoholic pancreatitis   . Asthma    Has albuterol inhaler  . Diabetes mellitus without complication ([place])    Type 2 diagnosed 2013   Past Surgical History:  Procedure Laterality Date  . COLONOSCOPY WITH PROPOFOL N/A 02/11/2018   Procedure: COLONOSCOPY WITH PROPOFOL;  Surgeon: Lin Landsman, MD;  Location: [facility] ENDOSCOPY;  Service: Gastroenterology;  Laterality: N/A;  . NO PAST SURGERIES     History   Social History  . Marital Status: Single    Spouse Name: N/A    Number of Children: 0  . Years of Education: 14   Occupational History  . Take Out Specialist     [employer]   Social History Main Topics  . Smoking status: Current Every Day Smoker -- 0.50 packs/day for 25 years    Types: Cigarettes  . Smokeless tobacco: Never Used  . Alcohol Use: 1.8 oz/week    3 Cans of beer per week  . Drug Use: No  . Sexual Activity: Yes    Birth Control/ Protection: Condom   Social History Narrative   Mary Haney was born in [place], [place]. She moved with her family to [place] at age 58. She attended 2 years at [employer] and was majoring in [place]. Her goal is to become a Optometrist in the [employer]. 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 Mary Haney lives with her boyfriend, [name]. They are considering getting married soon. She enjoys sketching outdoor scenery, cartoons and loves to read.   Current Outpatient Medications on File Prior to Visit  Medication Sig Dispense Refill  .  albuterol (PROAIR HFA) 108 (90 Base) MCG/ACT inhaler Inhale 2 puffs into the lungs every 6 (six) hours as needed for wheezing or shortness of breath. 1 Inhaler 1  . BD INSULIN SYRINGE U/F 30G X 1/2" 0.5 ML MISC USE AS DIRECTED WHEN TAKING INSULIN 100 each 3  . cetirizine (ZYRTEC) 10 MG tablet Take 10 mg by mouth daily as needed for allergies.     . Continuous Blood Gluc Sensor (FREESTYLE LIBRE 14 DAY SENSOR) MISC 1 each by Does not apply route every 14 (fourteen) days. 6 each 3  . glucagon (GLUCAGON EMERGENCY) 1 MG injection Inject 1 mg into the muscle once as needed for up to  1 dose. 1 each 12  . hydrocortisone (ANUSOL-HC) 25 MG suppository Place 1 suppository (25 mg total) rectally 2 (two) times daily. 12 suppository 0  . insulin aspart (NOVOLOG FLEXPEN) 100 UNIT/ML FlexPen Inject 3-4 Units into the skin 3 (three) times daily with meals. 5 pen 3  . insulin glargine (LANTUS) 100 UNIT/ML injection INJECT 13 UNITS INTO THE SKIN as advised 10 mL 11  . Insulin Pen Needle (B-D UF III MINI PEN NEEDLES) 31G X 5 MM MISC USE WHEN TAKING INSULIN. 100 each 5  . Insulin Syringe-Needle U-100 (INSULIN SYRINGE .5CC/30GX5/16") 30G X 5/16" 0.5 ML MISC Use as directed when taking insulin. 100 each 11  . triamcinolone ointment (KENALOG) 0.1 % Apply 1 application topically 2 (two) times daily. 80 g 2  . fluconazole (DIFLUCAN) 150 MG tablet Take one tablet PO once. If continue to have symptoms, may take one tablet PO 3 days later. (Patient not taking: Reported on 12/16/2019) 2 tablet 3   No current facility-administered medications on file prior to visit.   No Known Allergies Family History  Problem Relation Age of Onset  . Diabetes Father   . Depression Paternal Grandmother   . Depression Paternal Grandfather   . Depression Cousin   . Breast cancer Neg Hx   . Arthritis/Rheumatoid Neg Hx    PE: BP 132/80   Pulse 100   Ht _0  (1.6 m)   Wt 127 lb (57.6 kg)   SpO2 99%   BMI 22.50 kg/m  Wt Readings from  Last 3 Encounters:  12/27/19 127 lb (57.6 kg)  12/16/19 122 lb (55.3 kg)  09/20/19 122 lb (55.3 kg)   Constitutional: Normal  weight, in NAD Eyes: PERRLA, EOMI, no exophthalmos ENT: moist mucous membranes, + slight, symmetric thyromegaly, no cervical lymphadenopathy Cardiovascular: Tachycardia, RR, No MRG Respiratory: CTA B Gastrointestinal: abdomen soft, NT, ND, BS+ Musculoskeletal: no deformities, strength intact in all 4 Skin: moist, warm, no rashes Neurological: no tremor with outstretched hands, DTR normal in all 4   ASSESSMENT: 1. DM, insulin-dependent, uncontrolled, with complications - hyper- and hypo-glycemia - History of DKA  Component     Latest Ref Rng & Units 11/04/2016  Hemoglobin A1C      9.0  C-Peptide     0.80 - 3.85 ng/mL <0.10 (L)  Glucose, Fasting     65 - 99 mg/dL 40 (L)  Glutamic Acid Decarb Ab     <5 IU/mL >250 (H)  Pancreatic Islet Cell Antibody     <5 JDF Units <5   Glucose very low at the time of the blood draw, therefore, C-peptide is not interpretable. However, GAD antibodies are undetectably high, confirming type 1 diabetes.  2.  Palpable thyroid  PLAN:  1. Patient with history of uncontrolled type 1 diabetes, on basal-bolus insulin regimen, with increased insulin sensitivity, on low insulin doses.  Her diabetes control is hindered by increased stress at work and the fact that she is very busy and does not have time allotted to eat. -At last visit, sugars were decreasing overnight occasionally to 50s and we discussed about decreasing the p.m. Lantus dose during the week, when she is more active during the day.  Also, sugars were increasing significantly after breakfast and she was consistently only taking 20 units insulin before this meal so I advised her to increase to 4 units.  The sugars were dropping later in the afternoon as she was busier at work and we decreased the dose of insulin with  lunch from 2 units only 1 unit in the weekdays but  maintained the 2 units during the weekend.  We did not change insulin with dinner. -At last visit, HbA1c was much better, at 6.9% -At this visit, sugars are worse, more variable, and much higher during the night and after lunch.  She is doing a good job bolusing before her meals and does not usually miss doses per review of her CGM downloads, in which he also documents insulin doses. -Based on the CGM patterns, I advised her to increase her Lantus at night by 1 to 2 units, to decrease the insulin with coffee since she is dropping too abruptly after breakfast and coffee.  For now, we will continue the same dose of insulin with breakfast.  I advised her to increase her insulin with lunch and dinner since her sugars do increase after these meals.  However, we also discussed about trying to use an insulin to carb ratio and to count carbs.  She agrees to try this.  I explained how to calculate the dose of insulin based on carbs.  I think starting an insulin to carb ratio 1:12 would be adequate for her, but we may need to change this in the near future.  I  referred her to nutrition.  Given written references about carb counting. -  I suggested to:  Patient Instructions  Please change: - Lantus 6 units in a.m. and 6-8 units at bedtime-higher doses on Fridays and Saturdays. - Novolog:  Snack and coffee: 2 unit B'fast: 3 units Lunch: 4-5 units Dinner: 5-6 units  Try to bolus 15 to 30 minutes before each meal.  Please schedule an appt with Antonieta Iba with nutrition.  Afetr you see Mickel Baas, try to use an Insulin to carb ratio of 1:12.  Please come back for a follow-up appointment in 3-4 months.  - we checked her HbA1c: 8.7% (higher) - advised to check sugars at different times of the day - 3x a day, rotating check times - advised for yearly eye exams >> she is not UTD, but has an appointment scheduled - return to clinic in 3-4 months  2.  Palpable thyroid -This is symmetric, nonnodular -She does not  have neck compression symptoms -Latest TSH was reviewed and this was normal: Lab Results  Component Value Date   TSH 1.76 06/14/2019  -We will continue to follow her clinically   Philemon Kingdom, MD PhD [facility] Endocrinology

## 2019-12-29 ENCOUNTER — Other Ambulatory Visit: Payer: Self-pay | Admitting: Family

## 2019-12-30 ENCOUNTER — Telehealth: Payer: Self-pay | Admitting: Dietician

## 2019-12-30 ENCOUNTER — Telehealth: Payer: Self-pay

## 2019-12-30 MED ORDER — NOVOLOG FLEXPEN 100 UNIT/ML ~~LOC~~ SOPN: 2.0000 [IU] | PEN_INJECTOR | Freq: Three times a day (TID) | SUBCUTANEOUS | 3 refills | Status: DC

## 2019-12-30 NOTE — Telephone Encounter (Signed)
Novolog needs to be sent in to [facility] in [place] patient stated she needs that today and it is her second request

## 2019-12-30 NOTE — Telephone Encounter (Signed)
I have not received any refill request for this patient. I called her to let her know we had not gotten the request otherwise it would have been taken care of.  RX updated per last visit and sent to patient's requested pharmacy and patient has been notified.

## 2019-12-30 NOTE — Telephone Encounter (Signed)
Returned patient call. Patient needs an appointment to review Carbohydrate Counting. She needs to start an insulin to Carb ratio of 1:12. Appointment made for 01/06/2020 at 4:00.  Antonieta Iba, RD, LDN, CDE

## 2020-01-06 ENCOUNTER — Telehealth: Payer: Self-pay | Admitting: Dietician

## 2020-01-06 ENCOUNTER — Encounter: Payer: 59 | Attending: Internal Medicine | Admitting: Dietician

## 2020-01-06 NOTE — Telephone Encounter (Signed)
Message left with patient to reschedule her appointment today.  She did not return call or show for her appointment to review carbohydrate counting today.   Called and left a message again to reschedule this appointment.  She is to call [phone] to reschedule.  Antonieta Iba, RD, LDN, CDE

## 2020-01-09 ENCOUNTER — Other Ambulatory Visit: Payer: 59

## 2020-01-10 ENCOUNTER — Telehealth: Payer: Self-pay | Admitting: Family

## 2020-01-10 NOTE — Telephone Encounter (Signed)
Pt states she has a stye on her left eye and it is hurting/tender. Pt would like to know how to treat it. Please advise. [phone], push any option-ask for pt

## 2020-01-10 NOTE — Telephone Encounter (Signed)
Call patient Currently the standard of care for a stye is a warm compress which she may already be doing.  With a stye specifically she should NOT have any eye pain ,vision changes, HA, photophobia, or drainage from the eye.  If any of those things, she needs to be seen in urgent care today.  If she feels comfortable she can try warm compresses for couple days and see if that improves and if not she would need to be seen either here ( if we have openings) or at  urgent care

## 2020-01-11 NOTE — Telephone Encounter (Signed)
Noted  

## 2020-01-11 NOTE — Telephone Encounter (Signed)
FYI. I spoke with patient & she stated that she does have some slight drainage from her eye. She is going to [facility] to be seen.

## 2020-01-11 NOTE — Telephone Encounter (Signed)
I have tried to call patient at work & was unable to reach. I have tried on cell phone but there is no VM set up to leave a message.

## 2020-01-19 ENCOUNTER — Other Ambulatory Visit: Payer: 59

## 2020-01-23 ENCOUNTER — Ambulatory Visit
Admission: RE | Admit: 2020-01-23 | Discharge: 2020-01-23 | Disposition: A | Discharge status: Home / Self Care | Payer: 59 | Source: Ambulatory Visit | Attending: Family | Admitting: Family

## 2020-01-23 DIAGNOSIS — R928 Other abnormal and inconclusive findings on diagnostic imaging of breast: Secondary | ICD-10-CM | POA: Insufficient documentation

## 2020-02-03 ENCOUNTER — Telehealth: Payer: Self-pay | Admitting: Family

## 2020-02-03 DIAGNOSIS — L309 Dermatitis, unspecified: Secondary | ICD-10-CM

## 2020-02-03 MED ORDER — TRIAMCINOLONE ACETONIDE 0.1 % EX OINT: 1.0000 "application " | TOPICAL_OINTMENT | Freq: Two times a day (BID) | CUTANEOUS | 1 refills | Status: DC | PRN

## 2020-02-03 NOTE — Telephone Encounter (Signed)
Call pt I sent in jar Advise her to limit due as steriods can make cause discoloration in skin

## 2020-02-03 NOTE — Telephone Encounter (Signed)
Pt needs refill on triamcinolone ointment. Pt said she asked pharmacy is there a way she could get it in a jar not the tube. They told her if her pcp prescribes a larger quantity then she could get the jar instead of the tube. I told her I would ask if this is possible. Please advise.

## 2020-02-03 NOTE — Telephone Encounter (Signed)
Patient called and informed of below & pt verbalized understanding.

## 2020-02-10 ENCOUNTER — Encounter: Payer: 59 | Attending: Internal Medicine | Admitting: Dietician

## 2020-03-22 ENCOUNTER — Other Ambulatory Visit: Payer: Self-pay | Admitting: Internal Medicine

## 2020-04-02 ENCOUNTER — Other Ambulatory Visit: Payer: Self-pay

## 2020-04-02 ENCOUNTER — Encounter: Payer: Self-pay | Admitting: Internal Medicine

## 2020-04-02 ENCOUNTER — Ambulatory Visit (INDEPENDENT_AMBULATORY_CARE_PROVIDER_SITE_OTHER): Payer: 59 | Admitting: Internal Medicine

## 2020-04-02 VITALS — BP 120/62 | HR 97 | Ht 63.0 in | Wt 132.0 lb

## 2020-04-02 DIAGNOSIS — E1065 Type 1 diabetes mellitus with hyperglycemia: Secondary | ICD-10-CM | POA: Diagnosis not present

## 2020-04-02 LAB — POCT GLYCOSYLATED HEMOGLOBIN (HGB A1C): Hemoglobin A1C: 8.8 % — AB (ref 4.0–5.6)

## 2020-04-02 MED ORDER — FREESTYLE LIBRE 14 DAY SENSOR MISC: 1.0000 | 7 refills | Status: DC

## 2020-04-02 NOTE — Progress Notes (Signed)
Patient ID: Mary Haney, female   DOB: 18-Apr-1971, 49 y.o.   MRN: [id number]  This visit occurred during the SARS-CoV-2 public health emergency.  Safety protocols were in place, including screening questions prior to the visit, additional usage of staff PPE, and extensive cleaning of exam room while observing appropriate contact time as indicated for disinfecting solutions.   HPI: Mary Haney is a 49 y.o.-year-old female, returning for f/u for DM, dx 2013, insulin-dependent, uncontrolled, without long term complications. Last visit was 3 months ago. PCP: Dr. Caryl Bis  Before last visit, she had increased stress after she broke up with her boyfriend of 12 years.  Sugars were higher.   At this visit, she had increased stress with her mother having had knee surgery.  However, sugars are slightly better in the last 2 weeks.  Reviewed history: In 07/2012 she went to the ED because she was not feeling well. She was found to have a blood glucose level of 600 + and was admitted with DKA. She was followed by [name] immediately following initial diagnosis, then switched to [facility] endocrinology.   In 10/2013, due to lack of finances, she was stretching the insulin doses >> sugars high >> low CBG 25-26 >> ED.    She was admitted in 04/2016 for DKA.  At the end of 2017, she met the DM educator and nutritionist >> decided for Omnipod pump >> but nobody called her back >> this year cannot afford it.  She decided against an insulin pump.  She is usually very active at work so sugars are lower during the day.  Reviewed HbA1c levels: Lab Results  Component Value Date   HGBA1C 8.7 (A) 12/27/2019   HGBA1C 6.9 (A) 09/20/2019   HGBA1C 8.5 (A) 06/14/2019   Pt is on a regimen of: - Lantus 6 units in a.m. and 5-6 >> 6-8 units at bedtime-higher doses on Fridays and Saturdays  - Novolog: (At last visit I advised him to schedule an appointment with nutrition to learn how to use insulin to carb  ratios  - given an ICR of 1: 12 -not using this yet). Snack and coffee: 2 unit B'fast: 3 units Lunch: 4-5 units Dinner: 5-6 units  She checks her sugars more than 4 times a day with her freestyle libre CGM (we will scan reports): CGM parameters: - Average: 216 >> 193 >> 178 >> 249 >> 193 - % active CGM time: 82% >> 83% >> 85% >> 87% >> 90% of the time - Glucose variability 46.2% >> 47.8% >> 47.6% >> 35% >> 39.4% (target < or = to 36%) - time in range:  - very low (<54): 5% >> 6% >> 5% >> 0% >> 2% - low (54-69): 4% >> 4% >> 5% >> 1% >> 30% - normal range (70-180): 31% >> 37% >> 41% >> 22% >> 40% - high sugars (181-250): 21% >> 25% >> 28% >> 20% >> 29% - very high sugars (>250): 39% >> 28% >> 21% >> 49% >> 26%  CBG 25-75%:  Previously:  Previously:   Lowest sugar was  37 (very busy, forgot to eat) >> ...44 >> 82 >> 50; she has hypoglycemia awareness in the 60s. Highest sugar was 400s >> 400s.  Has ReliOn meter.  Pt's meals are: - Breakfast: oatmeal, yoghurt, sausage bisquit - Lunch: salad, soup, sandwiches - tea + honey ~ 3 pm - Dinner: meat + starch + vegetables - Snacks: yoghurt, peanut crackers, nuts  She is very busy at  work and does not have time allotted for lunch.  She continues to grab lunch to go.  -No CKD: Lab Results  Component Value Date   BUN 11 06/14/2019   Lab Results  Component Value Date   CREATININE 0.66 06/14/2019   -+ Dyslipidemia: Lab Results  Component Value Date   CHOL 159 06/14/2019   HDL 71.20 06/14/2019   LDLCALC 53 09/15/2017   LDLDIRECT 59.0 06/14/2019   TRIG 213.0 (H) 06/14/2019   CHOLHDL 2 06/14/2019   - last eye exam was in 03/2018: No DR -She denies numbness and tingling in her feet  Last TSH was normal: Lab Results  Component Value Date   TSH 1.76 06/14/2019   She has a palpable thyroid.  Pt denies: - feeling nodules in neck - hoarseness - dysphagia - choking - SOB with lying down  ROS: Constitutional: no  weight gain/no weight loss, no fatigue, no subjective hyperthermia, no subjective hypothermia Eyes: no blurry vision, no xerophthalmia ENT: no sore throat, + see HPI Cardiovascular: no CP/no SOB/no palpitations/no leg swelling Respiratory: no cough/no SOB/no wheezing Gastrointestinal: no N/no V/no D/no C/no acid reflux Musculoskeletal: no muscle aches/no joint aches Skin: no rashes, no hair loss Neurological: no tremors/no numbness/no tingling/no dizziness  I reviewed pt's medications, allergies, PMH, social hx, family hx, and changes were documented in the history of present illness. Otherwise, unchanged from my initial visit note.  Past Medical History:  Diagnosis Date  . Alcoholic pancreatitis   . Asthma    Has albuterol inhaler  . Diabetes mellitus without complication ([place])    Type 2 diagnosed 2013   Past Surgical History:  Procedure Laterality Date  . COLONOSCOPY WITH PROPOFOL N/A 02/11/2018   Procedure: COLONOSCOPY WITH PROPOFOL;  Surgeon: Lin Landsman, MD;  Location: [facility] ENDOSCOPY;  Service: Gastroenterology;  Laterality: N/A;  . NO PAST SURGERIES     History   Social History  . Marital Status: Single    Spouse Name: N/A    Number of Children: 0  . Years of Education: 14   Occupational History  . Take Out Specialist     [place]   Social History Main Topics  . Smoking status: Current Every Day Smoker -- 0.50 packs/day for 25 years    Types: Cigarettes  . Smokeless tobacco: Never Used  . Alcohol Use: 1.8 oz/week    3 Cans of beer per week  . Drug Use: No  . Sexual Activity: Yes    Birth Control/ Protection: Condom   Social History Narrative   Cedar was born in [place], [place]. She moved with her family to [place] at age 46. She attended 2 years at [employer] and was majoring in [place]. Her goal is to become a Optometrist in the [employer]. 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 Avon lives with her boyfriend, [name]. They are considering getting married soon. She enjoys sketching outdoor scenery, cartoons and loves to read.   Current Outpatient Medications on File Prior to Visit  Medication Sig Dispense Refill  . albuterol (PROAIR HFA) 108 (90 Base) MCG/ACT inhaler Inhale 2 puffs into the lungs every 6 (six) hours as needed for wheezing or shortness of breath. 1 Inhaler 1  . cetirizine (ZYRTEC) 10 MG tablet Take 10 mg by mouth daily as needed for allergies.     . Continuous Blood Gluc Sensor (FREESTYLE LIBRE 14 DAY SENSOR) MISC 1 EACH BY DOES NOT APPLY ROUTE EVERY  14 (FOURTEEN) DAYS. 4 each 7  . fluconazole (DIFLUCAN) 150 MG tablet Take one tablet PO once. If continue to have symptoms, may take one tablet PO 3 days later. (Patient not taking: Reported on 12/16/2019) 2 tablet 3  . glucagon (GLUCAGON EMERGENCY) 1 MG injection Inject 1 mg into the muscle once as needed for up to 1 dose. 1 each 12  . hydrocortisone (ANUSOL-HC) 25 MG suppository Place 1 suppository (25 mg total) rectally 2 (two) times daily. 12 suppository 0  . insulin aspart (NOVOLOG FLEXPEN) 100 UNIT/ML FlexPen Inject 2-9 Units into the skin 3 (three) times daily with meals. 5 pen 3  . insulin glargine (LANTUS) 100 UNIT/ML injection INJECT 13 UNITS INTO THE SKIN as advised 10 mL 11  . Insulin Pen Needle (B-D UF III MINI PEN NEEDLES) 31G X 5 MM MISC USE 5x WHEN TAKING INSULIN. 300 each 3  . Insulin Syringe-Needle U-100 (BD INSULIN SYRINGE U/F) 30G X 1/2" 0.5 ML MISC USE 2x a day WHEN TAKING INSULIN 200 each 3  . triamcinolone ointment (KENALOG) 0.1 % Apply 1 application topically 2 (two) times daily as needed. Limit use due risk of skin discoloration. 453.6 g 1   No current facility-administered medications on file prior to visit.   No Known Allergies Family History  Problem Relation Age of Onset  . Diabetes Father   . Depression Paternal Grandmother   . Depression Paternal Grandfather   . Depression Cousin   . Breast cancer Neg Hx     . Arthritis/Rheumatoid Neg Hx    PE: Ht '5\' 3"'  (1.6 m)   BMI 22.50 kg/m  Wt Readings from Last 3 Encounters:  12/27/19 127 lb (57.6 kg)  12/16/19 122 lb (55.3 kg)  09/20/19 122 lb (55.3 kg)   Constitutional: Normal weight, in NAD Eyes: PERRLA, EOMI, no exophthalmos ENT: moist mucous membranes, + slight, symmetric thyromegaly, no cervical lymphadenopathy Cardiovascular: RRR, No MRG Respiratory: CTA B Gastrointestinal: abdomen soft, NT, ND, BS+ Musculoskeletal: no deformities, strength intact in all 4 Skin: moist, warm, no rashes Neurological: no tremor with outstretched hands, DTR normal in all 4   ASSESSMENT: 1. DM, insulin-dependent, uncontrolled, with complications - hyper- and hypo-glycemia - History of DKA  Component     Latest Ref Rng & Units 11/04/2016  Hemoglobin A1C      9.0  C-Peptide     0.80 - 3.85 ng/mL <0.10 (L)  Glucose, Fasting     65 - 99 mg/dL 40 (L)  Glutamic Acid Decarb Ab     <5 IU/mL >250 (H)  Pancreatic Islet Cell Antibody     <5 JDF Units <5   Glucose very low at the time of the blood draw, therefore, C-peptide is not interpretable. However, GAD antibodies are undetectably high, confirming type 1 diabetes.  2.  Palpable thyroid  PLAN:  1. Patient with history of uncontrolled type 1 diabetes, on basal-bolus insulin regimen, with increased insulin sensitivity, on low insulin doses.  Her diabetes control is hindered by increased stress at work and the fact that she is very busy and does not have time allotted to eat.  At last visit sugars are higher after increased rest at home, after she broke up with her boyfriend. -At last visit we discussed about changing her regimen to include NovoLog dosing based on insulin to carb ratio.  I referred her to nutrition and advised her to use an ICR of 1: 12 afterwards.  She did not see the nutritionist yet, though. -  Based on the CGM patterns at that time, I advised her to increase her Lantus at night by 1 to 2  units and to decrease the insulin with coffee since she was dropping her insulin too much after this.  We also discussed about increasing insulin with lunch and dinner since her sugars did increase after these meals. -At this visit, sugars appear slightly better in the last 2 weeks, with an average of 193, but still quite fluctuating.  She does have more sugars in the normal range now compared to last visit, though. -Her sugars decreased after approximately 8 AM and day are better until approximately 3 PM when they start increasing abruptly.  Upon questioning, at that time she is having tea with honey and she does not bolus for this.  Sugars remain high after dinner and they stay high the entire night and occasionally increasing even higher throughout the night.  Therefore, we discussed about decreasing Lantus at night.  We will not change her NovoLog doses but I advised her to also bolus for her tea or stop the tea.  We again discussed about scheduling the appointment with nutrition, which he missed and switching 27 to carb ratio afterwards. -At this visit also we discussed about possibly using an inPen or, to check again with her insurance if they cover a pump.  I think she would really benefit from this. -  I suggested to:  Patient Instructions  Please change: - Lantus 6 units in a.m. and 9-10 units at bedtime-higher doses on Fridays and Saturdays. - Novolog:  Snack and coffee: 2 unit B'fast: 3 units Lunch: 4-5 units Dinner: 5-6 units  Try to bolus 15 to 30 minutes before each meal.  Please bolus with tea.  Please check with your insurance if they cover InPen and insulin pumps.  Please schedule an appt with Antonieta Iba with nutrition.  After you see Mickel Baas, try to use an Insulin to carb ratio of 1:12.  Please come back for a follow-up appointment in 3-4 months.  - we checked her HbA1c: 8.8% (slightly higher) - advised to check sugars at different times of the day - 4x a day, rotating  check times - advised for yearly eye exams >> she is not UTD - return to clinic in 3-4 months  2.  Palpable thyroid -Thyroid is symmetric and nonnodular -She denies neck compression symptoms -Latest TSH was reviewed and this was normal: Lab Results  Component Value Date   TSH 1.76 06/14/2019  -We will continue to follow her clinically   Philemon Kingdom, MD PhD [facility] Endocrinology

## 2020-04-02 NOTE — Patient Instructions (Addendum)
Please change: - Lantus 6 units in a.m. and 9-10 units at bedtime-higher doses on Fridays and Saturdays. - Novolog:  Snack and coffee: 2 unit B'fast: 3 units Lunch: 4-5 units Dinner: 5-6 units  Try to bolus 15 to 30 minutes before each meal.  Please bolus with tea.  Please check with your insurance if they cover InPen and insulin pumps.  Please schedule an appt with Antonieta Iba with nutrition.  Afetr you see Mickel Baas, try to use an Insulin to carb ratio of 1:12.  Please come back for a follow-up appointment in 3-4 months.

## 2020-04-03 ENCOUNTER — Telehealth: Payer: Self-pay | Admitting: Internal Medicine

## 2020-04-03 NOTE — Telephone Encounter (Signed)
Patient called about problems she is having with Free Style Libre sensors. Patient is requesting a call back at [phone]

## 2020-04-05 ENCOUNTER — Telehealth: Payer: Self-pay | Admitting: Dietician

## 2020-04-05 MED ORDER — FREESTYLE LIBRE 2 READER DEVI: 0 refills | Status: DC

## 2020-04-05 MED ORDER — FREESTYLE LIBRE 2 SENSOR MISC: 2 refills | Status: DC

## 2020-04-05 NOTE — Telephone Encounter (Signed)
Returned patient call. Patient is using a [employer] with a Reader. She just got the sensors refilled and states that the meter is not reading and states "incompatable".    Had patient check the sensor box.  Pharmacy gave her the [facility] 2 which apparently is incompatible with the reader for the [place] 14 day.  She is to exchanged these sensors to the store and call me for any further issues,  Also, made an appointment for carbohydrate counting review (1:12 ratio) for patient on Monday 5/3.  Antonieta Iba, RD, LDN, CDE

## 2020-04-05 NOTE — Telephone Encounter (Signed)
M, can you please send the [place] 2 receiver?

## 2020-04-05 NOTE — Telephone Encounter (Signed)
I called and left a message on Mary Haney's cell stating that you prefer her to be on the [place] 2 and that a prescription for the reader that matches these sensors has been sent to her pharmacy.  She will need an updated prescription for the FreeStyle [place] 2 sensors as the current prescription for sensors is for the [employer] 14 day.  Thanks, Mickel Baas

## 2020-04-05 NOTE — Telephone Encounter (Signed)
[place] 2 reader sent.

## 2020-04-05 NOTE — Telephone Encounter (Signed)
She has a prescription for the [employer] 14 day (and per patient a reader for this as well).  The pharmacy gave her the [place] 2 in error.  If you would prefer that she use the [place] 2 then she will need a prescription for this and I will need to call her back.

## 2020-04-05 NOTE — Telephone Encounter (Signed)
Libre 2 sensors sent.

## 2020-04-05 NOTE — Telephone Encounter (Signed)
Mary Haney, She would need  the freestyle libre 2 another regular freestyle libre 14, there is a large variation in her blood sugars.  Please let me know if I need to send anything to her pharmacy. Ty, C

## 2020-04-09 ENCOUNTER — Encounter: Payer: 59 | Attending: Internal Medicine | Admitting: Dietician

## 2020-04-09 ENCOUNTER — Other Ambulatory Visit: Payer: Self-pay

## 2020-04-09 DIAGNOSIS — E109 Type 1 diabetes mellitus without complications: Secondary | ICD-10-CM | POA: Insufficient documentation

## 2020-04-09 DIAGNOSIS — E1065 Type 1 diabetes mellitus with hyperglycemia: Secondary | ICD-10-CM

## 2020-04-09 NOTE — Patient Instructions (Addendum)
Your FreeStyle [place] 2 sensors and reader is at your pharmacy.  Practice carbohydrate counting.  Calorie [employer]

## 2020-04-09 NOTE — Progress Notes (Signed)
  Medical Nutrition Therapy:  Appt start time: [id number] end time:  1800.   Assessment:  Primary concerns today: She is here today with her father.  She needs a review of carbohydrate counting to help with meal time insulin dosing.  BG increased from October to January due to not being as focused.  History includes Type 1 diabetes.  She uses a [employer] 14 day ad will change to the [place] 2 when this sensor is finished. A1C 8.8 04/02/20, 8.7% 12/27/2019, 6.9% 09/20/2019.  She states that she was not as focussed on diabetes care as a reason for A1C increase.  She had also been using increased regular cough drops. Weight 132 lbs today increased from UBW 119-125 lbs. Medications include:  Lantus 6 units q am and 8 units q HS, Novolog 2 units for snacks, 4 units before breakfast and lunch, and 5-7 units before dinner.  Patient lives alone.  Her father is supportive. 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 She exercises 20 minutes 5 times per weeks.  Leg lifts and stretching.  Preferred Learning Style:   No preference indicated   Learning Readiness:   Ready   DIETARY INTAKE:  24-hr recall:  B ( AM): instant oatmeal and smoothie (banana, blueberries, strawberries, protein powder, OJ)  Snk ( AM):   L ( PM): scrambled egg, sausage sandwich on lite wheat bread with mayo and mustard OR egg, sausage, grits, biscuit, butter and small amount of jelly Snk ( PM): soup and salad OR fish or chicken with salad or steamed vegetable D ( PM): cheeseburger, french fries OR tacos, french fries OR meat starch, vegetable Snk ( PM): none Beverages: water, coffee with cream and splenda, tea with honey and liquid creamer, (off diet coke)  Usual physical activity: 20 minutes 5 times per week (leg lifts and stretching)  Progress Towards Goal(s):  In progress.   Nutritional Diagnosis:  NB-1.1 Food and nutrition-related knowledge deficit As related to balance of carbohydrate, protein, and fat.  As  evidenced by diet hx and patient report.    Intervention:  Nutrition education related to carbohydrate counting.  Patient could demonstrate.  Showed patient use of app as well as carbohydrate counting by portion size. Discussed that her insulin:Carb ratio is 1:12 and how to use this.  Discussed label reading.  Discussed benefits of reducing fat. Discussed that her FreeStyle [place] 2 has been ordered and she should use this once she is finished with the 14 day sensor.  Teaching Method Utilized:  Visual Auditory Hands on  Handouts given during visit include:  Meal plan card  Label reading  Eating out tips  Barriers to learning/adherence to lifestyle change: none  Demonstrated degree of understanding via:  Teach Back   Monitoring/Evaluation:  Dietary intake, exercise, label reading, and body weight prn.

## 2020-04-13 ENCOUNTER — Encounter: Payer: Self-pay | Admitting: Dietician

## 2020-05-08 ENCOUNTER — Telehealth: Payer: Self-pay | Admitting: Internal Medicine

## 2020-05-08 MED ORDER — FREESTYLE LIBRE 2 READER DEVI: 0 refills | Status: DC

## 2020-05-08 NOTE — Telephone Encounter (Signed)
Medication Refill Request  Did you call your pharmacy and request this refill first? Yes  . If patient has not contacted pharmacy first, instruct them to do so for future refills.  . Remind them that contacting the pharmacy for their refill is the quickest method to get the refill.  . Refill policy also stated that it will take anywhere between 24-72 hours to receive the refill.    Name of medication? Freestyle libre reader and sensors - needs new reader due to having issues with her current one.  Is this a 90 day supply? yes  Name and location of pharmacy?   [id number] [address], [place] Phone:  [phone]  Fax:  [phone]

## 2020-05-08 NOTE — Telephone Encounter (Signed)
Sent!

## 2020-07-02 LAB — HM DIABETES EYE EXAM

## 2020-07-09 ENCOUNTER — Encounter: Payer: Self-pay | Admitting: Family

## 2020-07-10 ENCOUNTER — Ambulatory Visit (INDEPENDENT_AMBULATORY_CARE_PROVIDER_SITE_OTHER): Payer: 59 | Admitting: Family

## 2020-07-10 ENCOUNTER — Other Ambulatory Visit: Payer: Self-pay

## 2020-07-10 ENCOUNTER — Encounter: Payer: Self-pay | Admitting: Family

## 2020-07-10 VITALS — BP 124/82 | HR 105 | Temp 98.1°F | Resp 14 | Ht 63.0 in | Wt 134.0 lb

## 2020-07-10 DIAGNOSIS — Z1159 Encounter for screening for other viral diseases: Secondary | ICD-10-CM | POA: Diagnosis not present

## 2020-07-10 DIAGNOSIS — R809 Proteinuria, unspecified: Secondary | ICD-10-CM | POA: Insufficient documentation

## 2020-07-10 DIAGNOSIS — E1065 Type 1 diabetes mellitus with hyperglycemia: Secondary | ICD-10-CM

## 2020-07-10 NOTE — Assessment & Plan Note (Signed)
Uncontrolled. Pending a1c which I will send to Dr Mayra Neer

## 2020-07-10 NOTE — Assessment & Plan Note (Signed)
Proteinuria seen in 2020.  Repeat urine protein today.  Advised low dose ACE , ARB for renal protection. She politely declines at this time.  We will discuss this at follow-up.

## 2020-07-10 NOTE — Progress Notes (Signed)
Subjective:    Patient ID: Mary Haney, female    DOB: 1971/05/30, 49 y.o.   MRN: [id number]  CC: Catricia Haney is a 49 y.o. female who presents today for follow up.   HPI: DM- wearing CGM and feels glucose has improved. No longer   No numbness in feet;  no wounds.   Has never been on antihypertensive.  LDL < 70.  Of the endocrine, Dr Renne Crigler DM 1, last a1c 8.8 HISTORY:  Past Medical History:  Diagnosis Date  . Alcoholic pancreatitis   . Asthma    Has albuterol inhaler  . Diabetes mellitus without complication ([place])    Type 2 diagnosed 2013   Past Surgical History:  Procedure Laterality Date  . COLONOSCOPY WITH PROPOFOL N/A 02/11/2018   Procedure: COLONOSCOPY WITH PROPOFOL;  Surgeon: Lin Landsman, MD;  Location: [facility] ENDOSCOPY;  Service: Gastroenterology;  Laterality: N/A;  . NO PAST SURGERIES     Family History  Problem Relation Age of Onset  . Diabetes Father   . Depression Paternal Grandmother   . Depression Paternal Grandfather   . Depression Cousin   . Breast cancer Neg Hx   . Arthritis/Rheumatoid Neg Hx     Allergies: Patient has no known allergies. Current Outpatient Medications on File Prior to Visit  Medication Sig Dispense Refill  . albuterol (PROAIR HFA) 108 (90 Base) MCG/ACT inhaler Inhale 2 puffs into the lungs every 6 (six) hours as needed for wheezing or shortness of breath. 1 Inhaler 1  . cetirizine (ZYRTEC) 10 MG tablet Take 10 mg by mouth daily as needed for allergies.     . Continuous Blood Gluc Receiver (FREESTYLE LIBRE 2 READER) DEVI For continuous glucose monitoring. 1 each 0  . Continuous Blood Gluc Sensor (FREESTYLE LIBRE 2 SENSOR) MISC Apply new sensor every 14 days 6 each 2  . glucagon (GLUCAGON EMERGENCY) 1 MG injection Inject 1 mg into the muscle once as needed for up to 1 dose. 1 each 12  . hydrocortisone (ANUSOL-HC) 25 MG suppository Place 1 suppository (25 mg total) rectally 2 (two) times daily. 12 suppository 0  . insulin  aspart (NOVOLOG FLEXPEN) 100 UNIT/ML FlexPen Inject 2-9 Units into the skin 3 (three) times daily with meals. 5 pen 3  . insulin glargine (LANTUS) 100 UNIT/ML injection INJECT 13 UNITS INTO THE SKIN as advised 10 mL 11  . Insulin Pen Needle (B-D UF III MINI PEN NEEDLES) 31G X 5 MM MISC USE 5x WHEN TAKING INSULIN. 300 each 3  . Insulin Syringe-Needle U-100 (BD INSULIN SYRINGE U/F) 30G X 1/2" 0.5 ML MISC USE 2x a day WHEN TAKING INSULIN 200 each 3  . triamcinolone ointment (KENALOG) 0.1 % Apply 1 application topically 2 (two) times daily as needed. Limit use due risk of skin discoloration. 453.6 g 1  . fluconazole (DIFLUCAN) 150 MG tablet Take one tablet PO once. If continue to have symptoms, may take one tablet PO 3 days later. (Patient not taking: Reported on 12/16/2019) 2 tablet 3   No current facility-administered medications on file prior to visit.    Social History   Tobacco Use  . Smoking status: Current Every Day Smoker    Packs/day: 0.50    Years: 25.00    Pack years: 12.50    Types: Cigarettes  . Smokeless tobacco: Never Used  . Tobacco comment: Has Chantix  Substance Use Topics  . Alcohol use: Yes    Alcohol/week: 3.0 standard drinks    Types: 3 Cans  of beer per week  . Drug use: No    Review of Systems  Constitutional: Negative for chills and fever.  Respiratory: Negative for cough.   Cardiovascular: Negative for chest pain and palpitations.  Gastrointestinal: Negative for nausea and vomiting.  Neurological: Negative for numbness.      Objective:    BP 124/82 (BP Location: Left Arm, Patient Position: Sitting, Cuff Size: Normal)   Pulse (!) 105   Temp 98.1 F (36.7 C) (Oral)   Resp 14   Ht 5\' 3"  (1.6 m)   Wt 134 lb (60.8 kg)   SpO2 99%   BMI 23.74 kg/m  BP Readings from Last 3 Encounters:  07/10/20 124/82  04/02/20 120/62  12/27/19 132/80   Wt Readings from Last 3 Encounters:  07/10/20 134 lb (60.8 kg)  04/13/20 132 lb (59.9 kg)  04/02/20 132 lb (59.9 kg)     Physical Exam Vitals reviewed.  Constitutional:      Appearance: She is well-developed.  Eyes:     Conjunctiva/sclera: Conjunctivae normal.  Cardiovascular:     Rate and Rhythm: Normal rate and regular rhythm.     Pulses: Normal pulses.     Heart sounds: Normal heart sounds.  Pulmonary:     Effort: Pulmonary effort is normal.     Breath sounds: Normal breath sounds. No wheezing, rhonchi or rales.  Skin:    General: Skin is warm and dry.  Neurological:     Mental Status: She is alert.  Psychiatric:        Speech: Speech normal.        Behavior: Behavior normal.        Thought Content: Thought content normal.        Assessment & Plan:   Problem List Items Addressed This Visit      Endocrine   Uncontrolled type 1 diabetes mellitus with hyperglycemia, with long-term current use of insulin (HCC)    Uncontrolled. Pending a1c which I will send to Dr Mayra Neer      Relevant Orders   Comprehensive metabolic panel   Lipid panel   Hemoglobin A1c   Microalbumin / creatinine urine ratio     Other   Proteinuria    Proteinuria seen in 2020.  Repeat urine protein today.  Advised low dose ACE , ARB for renal protection. She politely declines at this time.  We will discuss this at follow-up.       Other Visit Diagnoses    Encounter for hepatitis C screening test for low risk patient    -  Primary   Relevant Orders   Hepatitis C antibody       I am having [name] maintain her cetirizine, hydrocortisone, glucagon, albuterol, fluconazole, insulin glargine, B-D UF III MINI PEN NEEDLES, BD Insulin Syringe U/F, NovoLOG FlexPen, triamcinolone ointment, FreeStyle Libre 2 Sensor, and [employer] 2 Reader.   No orders of the defined types were placed in this encounter.   Return precautions given.   Risks, benefits, and alternatives of the medications and treatment plan prescribed today were discussed, and patient expressed understanding.   Education regarding  symptom management and diagnosis given to patient on AVS.  Continue to follow with Burnard Hawthorne, FNP for routine health maintenance.   [name] and I agreed with plan.   Mable Paris, FNP

## 2020-07-10 NOTE — Patient Instructions (Addendum)
Due February 2022. Call us for orders for : Bilateral diagnostic mammogram and left breast ultrasound a month prior.  Nice to see you!

## 2020-07-25 ENCOUNTER — Other Ambulatory Visit (INDEPENDENT_AMBULATORY_CARE_PROVIDER_SITE_OTHER): Payer: 59

## 2020-07-25 ENCOUNTER — Other Ambulatory Visit: Payer: Self-pay

## 2020-07-25 DIAGNOSIS — Z1159 Encounter for screening for other viral diseases: Secondary | ICD-10-CM

## 2020-07-25 DIAGNOSIS — E1065 Type 1 diabetes mellitus with hyperglycemia: Secondary | ICD-10-CM | POA: Diagnosis not present

## 2020-07-25 LAB — LIPID PANEL
Cholesterol: 167 mg/dL (ref 0–200)
HDL: 73.2 mg/dL (ref >39.00)
LDL Cholesterol: 83 mg/dL (ref 0–99)
NonHDL: 93.63
Total CHOL/HDL Ratio: 2
Triglycerides: 51 mg/dL (ref 0.0–149.0)
VLDL: 10.2 mg/dL (ref 0.0–40.0)

## 2020-07-25 LAB — COMPREHENSIVE METABOLIC PANEL
ALT: 11 U/L (ref 0–35)
AST: 19 U/L (ref 0–37)
Albumin: 4.5 g/dL (ref 3.5–5.2)
Alkaline Phosphatase: 54 U/L (ref 39–117)
BUN: 9 mg/dL (ref 6–23)
CO2: 28 mEq/L (ref 19–32)
Calcium: 9.5 mg/dL (ref 8.4–10.5)
Chloride: 102 mEq/L (ref 96–112)
Creatinine, Ser: 0.74 mg/dL (ref 0.40–1.20)
GFR: 101.45 mL/min (ref >60.00)
Glucose, Bld: 143 mg/dL — ABNORMAL HIGH (ref 70–99)
Potassium: 3.9 mEq/L (ref 3.5–5.1)
Sodium: 137 mEq/L (ref 135–145)
Total Bilirubin: 0.4 mg/dL (ref 0.2–1.2)
Total Protein: 7.5 g/dL (ref 6.0–8.3)

## 2020-07-25 LAB — MICROALBUMIN / CREATININE URINE RATIO
Creatinine,U: 38.4 mg/dL
Microalb Creat Ratio: 1.8 mg/g (ref 0.0–30.0)
Microalb, Ur: <0.7 mg/dL (ref 0.0–1.9)

## 2020-07-25 LAB — HEMOGLOBIN A1C: Hgb A1c MFr Bld: 7.9 % — ABNORMAL HIGH (ref 4.6–6.5)

## 2020-07-26 LAB — HEPATITIS C ANTIBODY
Hepatitis C Ab: NONREACTIVE
SIGNAL TO CUT-OFF: 0.01 (ref <1.00)

## 2020-08-06 ENCOUNTER — Ambulatory Visit (INDEPENDENT_AMBULATORY_CARE_PROVIDER_SITE_OTHER): Payer: 59 | Admitting: Internal Medicine

## 2020-08-06 ENCOUNTER — Encounter: Payer: Self-pay | Admitting: Internal Medicine

## 2020-08-06 ENCOUNTER — Other Ambulatory Visit: Payer: Self-pay

## 2020-08-06 VITALS — BP 140/80 | HR 100 | Ht 63.0 in | Wt 133.0 lb

## 2020-08-06 DIAGNOSIS — E1065 Type 1 diabetes mellitus with hyperglycemia: Secondary | ICD-10-CM | POA: Diagnosis not present

## 2020-08-06 DIAGNOSIS — E0789 Other specified disorders of thyroid: Secondary | ICD-10-CM | POA: Diagnosis not present

## 2020-08-06 NOTE — Progress Notes (Signed)
Patient ID: Mary Haney, female   DOB: 26-Nov-1971, 49 y.o.   MRN: [id number]  This visit occurred during the SARS-CoV-2 public health emergency.  Safety protocols were in place, including screening questions prior to the visit, additional usage of staff PPE, and extensive cleaning of exam room while observing appropriate contact time as indicated for disinfecting solutions.   HPI: Mary Haney is a 49 y.o.-year-old female, returning for f/u for DM, dx 2013, insulin-dependent, uncontrolled, without long term complications. Last visit was 4 months ago. PCP: Dr. Caryl Bis  At the end of last year, she had increased stress after she broke up with her boyfriend of 12 years.  Sugars were higher but they started to improve afterwards.  The last few weeks she has been working on improving her diet and the consistency of her bolusing.  Reviewed and addended history: In 07/2012 she went to the ED because she was not feeling well. She was found to have a blood glucose level of 600 + and was admitted with DKA. She was followed by [name] immediately following initial diagnosis, then switched to [facility] endocrinology.   In 10/2013, due to lack of finances, she was stretching the insulin doses >> sugars high >> low CBG 25-26 >> ED.    She was admitted in 04/2016 for DKA.  At the end of 2017, she met the DM educator and nutritionist >> decided for Omnipod pump >> but nobody called her back >> this year cannot afford it.  She then decided against an insulin pump, but now has a freestyle libre CGM.  She is usually very active at work so sugars are lower during the day.  Reviewed HbA1c levels: Lab Results  Component Value Date   HGBA1C 7.9 (H) 07/25/2020   HGBA1C 8.8 (A) 04/02/2020   HGBA1C 8.7 (A) 12/27/2019   HGBA1C 6.9 (A) 09/20/2019   HGBA1C 8.5 (A) 06/14/2019   HGBA1C 9.1 (A) 02/15/2019   HGBA1C 7.4 (A) 06/22/2018   HGBA1C 7.7 03/18/2018   HGBA1C 7.9 12/17/2017   HGBA1C 7.9 09/15/2017    HGBA1C 8.1 04/02/2017   HGBA1C 9.0 11/04/2016   HGBA1C 10.8 (H) 08/07/2016   HGBA1C 10.3 (H) 05/07/2016   HGBA1C 7.8 (H) 04/03/2015   HGBA1C 9.0 (H) 07/20/2014   HGBA1C 8.5 (H) 01/04/2014   HGBA1C 13.9 (H) 06/13/2012   Pt is on a regimen of:  - Lantus 6 units in a.m. and 5-6 >> 6-8 >> 9-10 units at bedtime-higher doses on Fridays and Saturdays >> 6-7 units - Novolog: Insulin to carb ratio 1:12 Ends up with: Coffee:  1-2 unit B'fast: 3-4 units Lunch: 3-40units (does not calculate) Dinner: 3-4 units  She checks her sugars >4 times a day with her freestyle libre 2 CGM:  CGM parameters: - Average: 216 >> 193 >> 178 >> 249 >> 193 >> 151 - % active CGM time: 82% >> 83% >> 85% >> 87% >> 90% >> 92% of the time - Glucose variability 46.2% >> 47.8% >> 47.6% >> 35% >> 39.4% >> 43.5%(target < or = to 36%) - time in range:  - very low (<54): 5% >> 6% >> 5% >> 0% >> 2% >> 1% - low (54-69): 4% >> 4% >> 5% >> 1% >> 30% >> 7% - normal range (70-180): 31% >> 37% >> 41% >> 22% >> 40% >> 61% - high sugars (181-250): 21% >> 25% >> 28% >> 20% >> 29% >> 24% - very high sugars (>250): 39% >> 28% >> 21% >>  49% >> 26% >> 7%     Previously:  Previously:  Previously:   Lowest sugar was  37 (very busy, forgot to eat) >> ...44 >> 82 >> 50 >> 53; she has hypoglycemia awareness in the 60s. Highest sugar was 400s >> 400s >>> 350.  Has ReliOn meter.  Pt's meals are: - Breakfast: oatmeal, yoghurt, sausage bisquit - Lunch: salad, soup, sandwiches  - Dinner: meat + starch + vegetables - Snacks: yoghurt, peanut crackers, nuts  She is very busy at work and does not have time allotted for lunch.  She continues to grab lunch to go.  -No CKD: Lab Results  Component Value Date   BUN 9 07/25/2020   Lab Results  Component Value Date   CREATININE 0.74 07/25/2020   -+ History of dyslipidemia: Lab Results  Component Value Date   CHOL 167 07/25/2020   HDL 73.20 07/25/2020   LDLCALC 83  07/25/2020   LDLDIRECT 59.0 06/14/2019   TRIG 51.0 07/25/2020   CHOLHDL 2 07/25/2020   - last eye exam was in 06/2020: No DR -No numbness and tingling in her feet  Last TSH was normal: Lab Results  Component Value Date   TSH 1.76 06/14/2019   She has a palpable thyroid.  Pt denies: - feeling nodules in neck - hoarseness - dysphagia - choking - SOB with lying down  ROS: Constitutional: no weight gain/no weight loss, no fatigue, no subjective hyperthermia, no subjective hypothermia Eyes: no blurry vision, no xerophthalmia ENT: no sore throat, + see HPI Cardiovascular: no CP/no SOB/no palpitations/no leg swelling Respiratory: no cough/no SOB/no wheezing Gastrointestinal: no N/no V/no D/no C/no acid reflux Musculoskeletal: no muscle aches/no joint aches Skin: no rashes, no hair loss Neurological: no tremors/no numbness/no tingling/no dizziness  I reviewed pt's medications, allergies, PMH, social hx, family hx, and changes were documented in the history of present illness. Otherwise, unchanged from my initial visit note.  Past Medical History:  Diagnosis Date  . Alcoholic pancreatitis   . Asthma    Has albuterol inhaler  . Diabetes mellitus without complication ([place])    Type 2 diagnosed 2013   Past Surgical History:  Procedure Laterality Date  . COLONOSCOPY WITH PROPOFOL N/A 02/11/2018   Procedure: COLONOSCOPY WITH PROPOFOL;  Surgeon: Lin Landsman, MD;  Location: [facility] ENDOSCOPY;  Service: Gastroenterology;  Laterality: N/A;  . NO PAST SURGERIES     History   Social History  . Marital Status: Single    Spouse Name: N/A    Number of Children: 0  . Years of Education: 14   Occupational History  . Take Out Specialist     [employer]   Social History Main Topics  . Smoking status: Current Every Day Smoker -- 0.50 packs/day for 25 years    Types: Cigarettes  . Smokeless tobacco: Never Used  . Alcohol Use: 1.8 oz/week    3 Cans of beer per week  . Drug  Use: No  . Sexual Activity: Yes    Birth Control/ Protection: Condom   Social History Narrative   Mary Haney was born in [place], [place]. She moved with her family to [place] at age 9. She attended 2 years at [employer] and was majoring in [place]. Her goal is to become a Optometrist in the [employer]. 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 Idalie lives with her boyfriend, [name]. They are considering getting married soon. She enjoys sketching outdoor scenery, cartoons and loves  to read.   Current Outpatient Medications on File Prior to Visit  Medication Sig Dispense Refill  . albuterol (PROAIR HFA) 108 (90 Base) MCG/ACT inhaler Inhale 2 puffs into the lungs every 6 (six) hours as needed for wheezing or shortness of breath. 1 Inhaler 1  . cetirizine (ZYRTEC) 10 MG tablet Take 10 mg by mouth daily as needed for allergies.     . Continuous Blood Gluc Receiver (FREESTYLE LIBRE 2 READER) DEVI For continuous glucose monitoring. 1 each 0  . Continuous Blood Gluc Sensor (FREESTYLE LIBRE 2 SENSOR) MISC Apply new sensor every 14 days 6 each 2  . fluconazole (DIFLUCAN) 150 MG tablet Take one tablet PO once. If continue to have symptoms, may take one tablet PO 3 days later. (Patient not taking: Reported on 12/16/2019) 2 tablet 3  . glucagon (GLUCAGON EMERGENCY) 1 MG injection Inject 1 mg into the muscle once as needed for up to 1 dose. 1 each 12  . hydrocortisone (ANUSOL-HC) 25 MG suppository Place 1 suppository (25 mg total) rectally 2 (two) times daily. 12 suppository 0  . insulin aspart (NOVOLOG FLEXPEN) 100 UNIT/ML FlexPen Inject 2-9 Units into the skin 3 (three) times daily with meals. 5 pen 3  . insulin glargine (LANTUS) 100 UNIT/ML injection INJECT 13 UNITS INTO THE SKIN as advised 10 mL 11  . Insulin Pen Needle (B-D UF III MINI PEN NEEDLES) 31G X 5 MM MISC USE 5x WHEN TAKING INSULIN. 300 each 3  . Insulin Syringe-Needle U-100 (BD INSULIN SYRINGE U/F) 30G X 1/2" 0.5  ML MISC USE 2x a day WHEN TAKING INSULIN 200 each 3  . triamcinolone ointment (KENALOG) 0.1 % Apply 1 application topically 2 (two) times daily as needed. Limit use due risk of skin discoloration. 453.6 g 1   No current facility-administered medications on file prior to visit.   No Known Allergies Family History  Problem Relation Age of Onset  . Diabetes Father   . Depression Paternal Grandmother   . Depression Paternal Grandfather   . Depression Cousin   . Breast cancer Neg Hx   . Arthritis/Rheumatoid Neg Hx    PE: BP 140/80   Pulse 100   Ht '5\' 3"'  (1.6 m)   Wt 133 lb (60.3 kg)   SpO2 98%   BMI 23.56 kg/m  Wt Readings from Last 3 Encounters:  08/06/20 133 lb (60.3 kg)  07/10/20 134 lb (60.8 kg)  04/13/20 132 lb (59.9 kg)   Constitutional: normal weight, in NAD Eyes: PERRLA, EOMI, no exophthalmos ENT: moist mucous membranes, + slight, symmetric thyromegaly, no cervical lymphadenopathy Cardiovascular: tachycardia,RR, No MRG Respiratory: CTA B Gastrointestinal: abdomen soft, NT, ND, BS+ Musculoskeletal: no deformities, strength intact in all 4 Skin: moist, warm, no rashes Neurological: no tremor with outstretched hands, DTR normal in all 4   ASSESSMENT: 1. DM, insulin-dependent, uncontrolled, with complications - hyper- and hypo-glycemia - History of DKA  Component     Latest Ref Rng & Units 11/04/2016  Hemoglobin A1C      9.0  C-Peptide     0.80 - 3.85 ng/mL <0.10 (L)  Glucose, Fasting     65 - 99 mg/dL 40 (L)  Glutamic Acid Decarb Ab     <5 IU/mL >250 (H)  Pancreatic Islet Cell Antibody     <5 JDF Units <5   Glucose very low at the time of the blood draw, therefore, C-peptide is not interpretable. However, GAD antibodies are undetectably high, confirming type 1 diabetes.  2.  Palpable thyroid  PLAN:  1. Patient with history of uncontrolled type 1 diabetes, on basal/bolus insulin regimen, with increased insulin sensitivity, on low insulin doses.  Her  diabetes control is hindered by increased stress at work and also the fact that she is very busy and does not have time allotted to eat.  At last visit we increased her Lantus at night and also discussed about changing her insulin doses to be placed on an insulin to carb ratio.  I referred her to nutrition and she did have this appointment.  She is currently using an insulin to carb ratio of 1:12.  This is working better for her and an HbA1c obtained less than 2 weeks ago was better, at 7.9%. -At last visit I also advised her to check with her insurance whether they would cover an inPen but she did not do so yet. CGM interpretation: -At this visit, we reviewed together her CGM tracings and the overall CBG profile appears much improved compared to previous readings.  Her sugars are 61% in the target range, while at last visit they were only 40% in the target range.  Also, she only has now 7% lows while at last visit she was having 30% lows.  She has increased variability during the night and this appears to be depending upon dinner.  She has fattier dinners and I believe this impresses the absorption of her sugars so that sometimes even if she boluses correctly at the beginning of the meal, she may develop hyperglycemia during the night.  Alternatively, when she has more carbs, she may drop her sugars around 3 AM.  She already decreased the dose of her Lantus at night to 6 units and for now we will continue this due to occasional low blood sugars at night.  The sugars appear to be only slightly higher after breakfast and they do decrease after lunch.  She has an increase after dinner but not consistently.  She is trying to calculate the insulin doses based on insulin to carb ratios.  For now, I do not feel that we need to change the ICR is due to increased variability but I advised her to try to be more consistently and bolusing before the meals and the timing of her meals for now.  It will be difficult to adjust  the insulin dose before dinner based on the glycemic index and the content of that for dinner so for now, I advised her to at least work on the timing of her injections before the meal and to calculate the carbs correctly.   -  I suggested to:  Patient Instructions  Please change: - Lantus 6 units in a.m. and 6 units at bedtime - Novolog - ICR 1:12   Try to bolus 15 to 30 minutes before each meal.  Please come back for a follow-up appointment in 4 months.  - advised to check sugars at different times of the day - 4x a day, rotating check times - advised for yearly eye exams >> she is UTD - return to clinic in 3-4 months  2.  Palpable thyroid -Thyroid is symmetric and nonnodular -She denies neck compression symptoms -Latest TSH was normal: Lab Results  Component Value Date   TSH 1.76 06/14/2019  -We will continue to follow her clinically   Philemon Kingdom, MD PhD [facility] Endocrinology

## 2020-08-06 NOTE — Patient Instructions (Addendum)
Please change: - Lantus 6 units in a.m. and 6 units at bedtime - Novolog - ICR 1:12   Try to bolus 15 to 30 minutes before each meal.  Please come back for a follow-up appointment in 4 months.

## 2020-08-09 ENCOUNTER — Other Ambulatory Visit: Payer: Self-pay | Admitting: Internal Medicine

## 2020-10-08 ENCOUNTER — Other Ambulatory Visit: Payer: 59

## 2020-10-10 ENCOUNTER — Encounter: Payer: 59 | Admitting: Family

## 2020-10-11 ENCOUNTER — Other Ambulatory Visit: Payer: Self-pay | Admitting: Internal Medicine

## 2020-10-11 MED ORDER — FREESTYLE LIBRE 2 SENSOR MISC: 2 refills | Status: DC

## 2020-10-11 MED ORDER — "BD INSULIN SYRINGE U/F 30G X 1/2"" 0.5 ML MISC": 3 refills | Status: AC

## 2020-10-11 NOTE — Telephone Encounter (Signed)
Patient called to request the following RX's:  Insulin Syringe-Needle U-100 (BD INSULIN SYRINGE U/F) 30G X 1/2" 0.5 ML MISC   90 day supply  AND  Continuous Blood Gluc Sensor (FREESTYLE LIBRE 2 SENSOR) MISC   90 Day supply (patient used to receive 90 day supply until the last 2 refills which was only 2 sensor)  Sent to  [id number] [address], [place] Phone:  [phone]  Fax:  [phone]

## 2020-10-11 NOTE — Telephone Encounter (Signed)
Refills sent

## 2020-11-14 ENCOUNTER — Encounter: Payer: 59 | Admitting: Family

## 2020-12-17 ENCOUNTER — Ambulatory Visit (INDEPENDENT_AMBULATORY_CARE_PROVIDER_SITE_OTHER): Payer: 59 | Admitting: Internal Medicine

## 2020-12-17 ENCOUNTER — Encounter: Payer: Self-pay | Admitting: Internal Medicine

## 2020-12-17 ENCOUNTER — Other Ambulatory Visit: Payer: Self-pay

## 2020-12-17 VITALS — BP 140/100 | HR 104 | Ht 63.0 in | Wt 130.2 lb

## 2020-12-17 DIAGNOSIS — E0789 Other specified disorders of thyroid: Secondary | ICD-10-CM

## 2020-12-17 DIAGNOSIS — E1065 Type 1 diabetes mellitus with hyperglycemia: Secondary | ICD-10-CM

## 2020-12-17 LAB — POCT GLYCOSYLATED HEMOGLOBIN (HGB A1C): Hemoglobin A1C: 7.1 % — AB (ref 4.0–5.6)

## 2020-12-17 MED ORDER — GLUCAGON 3 MG/DOSE NA POWD: 3.0000 mg | Freq: Once | NASAL | 11 refills | Status: DC | PRN

## 2020-12-17 NOTE — Patient Instructions (Addendum)
Please decrease: - Lantus 5 units in a.m. and 5 units at bedtime - Novolog - ICR 1:12 for starches but use    1:15 otherwise  Try to bolus 15 to 30 minutes before each meal.  Please come back for a follow-up appointment in 3 months.

## 2020-12-17 NOTE — Progress Notes (Signed)
Patient ID: Mary Haney, female   DOB: 1971/11/06, 50 y.o.   MRN: [id number]  This visit occurred during the SARS-CoV-2 public health emergency.  Safety protocols were in place, including screening questions prior to the visit, additional usage of staff PPE, and extensive cleaning of exam room while observing appropriate contact time as indicated for disinfecting solutions.    HPI: Mary Haney is a 50 y.o.-year-old female, returning for f/u for DM, dx 2013, insulin-dependent, uncontrolled, without long term complications. Last visit was 3.5 months ago. PCP: Dr. Caryl Bis  Reviewed and addended history In 07/2012 she went to the ED because she was not feeling well. She was found to have a blood glucose level of 600 + and was admitted with DKA. She was followed by [name] immediately following initial diagnosis, then switched to [facility] endocrinology.   In 10/2013, due to lack of finances, she was stretching the insulin doses >> sugars high >> low CBG 25-26 >> ED.    She was admitted in 04/2016 for DKA.  At the end of 2017, she met the DM educator and nutritionist >> decided for Omnipod pump >> but nobody called her back >> this year cannot afford it.  She decided against that insulin pump, afterwards, but she now has a CGM.  She is usually very active at work during the day.  Reviewed HbA1c levels: Lab Results  Component Value Date   HGBA1C 7.9 (H) 07/25/2020   HGBA1C 8.8 (A) 04/02/2020   HGBA1C 8.7 (A) 12/27/2019   HGBA1C 6.9 (A) 09/20/2019   HGBA1C 8.5 (A) 06/14/2019   HGBA1C 9.1 (A) 02/15/2019   HGBA1C 7.4 (A) 06/22/2018   HGBA1C 7.7 03/18/2018   HGBA1C 7.9 12/17/2017   HGBA1C 7.9 09/15/2017   HGBA1C 8.1 04/02/2017   HGBA1C 9.0 11/04/2016   HGBA1C 10.8 (H) 08/07/2016   HGBA1C 10.3 (H) 05/07/2016   HGBA1C 7.8 (H) 04/03/2015   HGBA1C 9.0 (H) 07/20/2014   HGBA1C 8.5 (H) 01/04/2014   HGBA1C 13.9 (H) 06/13/2012   Pt is on a regimen of: - Lantus 6 units in a.m. and  5-6 >> 6-8 >> 9-10 units at bedtime-higher doses on Fridays and Saturdays >> 6 units twice a day - Novolog: Insulin to carb ratio 1:12 Ends up with: 2-4, occas. 5 units per meal  She checks her sugars more than 4 times a day with her freestyle libre CGM:  CGM parameters: - Average: 249 >> 193 >> 151 >> 142 - % active CGM time: 90% >> 92% >> 78% of the time - Glucose variability:  39.4% >> 43.5% >> 57% (target < or = to 36%) - time in range:  - very low (<54): 0% >> 2% >> 1% >> 6% - low (54-69): 1% >> 30% >> 7% >> 15% - normal range (70-180): 22% >> 40% >> 61% >> 54% - high sugars (181-250): 20% >> 29% >> 24% >> 13% - very high sugars (>250): 49% >> 26% >> 7% >> 12%    Previously:   Previously:  Previously:   Lowest sugar was  37 (very busy, forgot to eat) >> ...50 >> 53 >> 49; she has hypoglycemia awareness in the 60s. Highest sugar was 400s >> 350 >> 400.  Has ReliOn meter.  Pt's meals are: - Breakfast: oatmeal, yoghurt, sausage bisquit - Lunch: salad, soup, sandwiches  - Dinner: meat + starch + vegetables - Snacks: yoghurt, peanut crackers, nuts  She is very busy at work and does not have time allotted for  lunch. She continues to grab lunch to go. She cannot not inject insulin 15 minutes before lunch  -No CKD: Lab Results  Component Value Date   BUN 9 07/25/2020   Lab Results  Component Value Date   CREATININE 0.74 07/25/2020   -She has a history of dyslipidemia: Lab Results  Component Value Date   CHOL 167 07/25/2020   HDL 73.20 07/25/2020   LDLCALC 83 07/25/2020   LDLDIRECT 59.0 06/14/2019   TRIG 51.0 07/25/2020   CHOLHDL 2 07/25/2020   - last eye exam was in 06/2020: No DR - no numbness and tingling in her feet  Latest TSH was normal: Lab Results  Component Value Date   TSH 1.76 06/14/2019   Her thyroid remains palpable.  Pt denies: - feeling nodules in neck - hoarseness - dysphagia - choking - SOB with lying  down  ROS: Constitutional: no weight gain/no weight loss, no fatigue, no subjective hyperthermia, no subjective hypothermia Eyes: no blurry vision, no xerophthalmia ENT: no sore throat, + see HPI Cardiovascular: no CP/no SOB/no palpitations/no leg swelling Respiratory: no cough/no SOB/no wheezing Gastrointestinal: no N/no V/no D/no C/no acid reflux Musculoskeletal: no muscle aches/no joint aches Skin: no rashes, no hair loss Neurological: no tremors/no numbness/no tingling/no dizziness  I reviewed pt's medications, allergies, PMH, social hx, family hx, and changes were documented in the history of present illness. Otherwise, unchanged from my initial visit note.  Past Medical History:  Diagnosis Date  . Alcoholic pancreatitis   . Asthma    Has albuterol inhaler  . Diabetes mellitus without complication ([place])    Type 2 diagnosed 2013   Past Surgical History:  Procedure Laterality Date  . COLONOSCOPY WITH PROPOFOL N/A 02/11/2018   Procedure: COLONOSCOPY WITH PROPOFOL;  Surgeon: Lin Landsman, MD;  [facility] ENDOSCOPY;  Service: Gastroenterology;  Laterality: N/A;  . NO PAST SURGERIES     History   Social History  . Marital Status: Single    Spouse Name: N/A    Number of Children: 0  . Years of Education: 14   Occupational History  . Take Out Specialist     [employer]   Social History Main Topics  . Smoking status: Current Every Day Smoker -- 0.50 packs/day for 25 years    Types: Cigarettes  . Smokeless tobacco: Never Used  . Alcohol Use: 1.8 oz/week    3 Cans of beer per week  . Drug Use: No  . Sexual Activity: Yes    Birth Control/ Protection: Condom   Social History Narrative   Declan was born in [place], [place]. She moved with her family to [place] at age 30. She attended 2 years at [employer] and was majoring in [place]. Her goal is to become a Optometrist in the [employer]. 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  Lillee lives with her boyfriend, [name]. They are considering getting married soon. She enjoys sketching outdoor scenery, cartoons and loves to read.   Current Outpatient Medications on File Prior to Visit  Medication Sig Dispense Refill  . albuterol (PROAIR HFA) 108 (90 Base) MCG/ACT inhaler Inhale 2 puffs into the lungs every 6 (six) hours as needed for wheezing or shortness of breath. 1 Inhaler 1  . cetirizine (ZYRTEC) 10 MG tablet Take 10 mg by mouth daily as needed for allergies.     . Continuous Blood Gluc Receiver (FREESTYLE LIBRE 2 READER) DEVI For continuous glucose monitoring. 1 each 0  .  Continuous Blood Gluc Sensor (FREESTYLE LIBRE 2 SENSOR) MISC Apply new sensor every 14 days 6 each 2  . fluconazole (DIFLUCAN) 150 MG tablet Take one tablet PO once. If continue to have symptoms, may take one tablet PO 3 days later. 2 tablet 3  . glucagon (GLUCAGON EMERGENCY) 1 MG injection Inject 1 mg into the muscle once as needed for up to 1 dose. 1 each 12  . hydrocortisone (ANUSOL-HC) 25 MG suppository Place 1 suppository (25 mg total) rectally 2 (two) times daily. 12 suppository 0  . insulin glargine (LANTUS) 100 UNIT/ML injection INJECT 13 UNITS INTO THE SKIN as advised 10 mL 11  . Insulin Pen Needle (B-D UF III MINI PEN NEEDLES) 31G X 5 MM MISC USE 5x WHEN TAKING INSULIN. 300 each 3  . Insulin Syringe-Needle U-100 (BD INSULIN SYRINGE U/F) 30G X 1/2" 0.5 ML MISC USE 2x a day WHEN TAKING INSULIN 200 each 3  . NOVOLOG FLEXPEN 100 UNIT/ML FlexPen INJECT 2-9 UNITS INTO THE SKIN 3 (THREE) TIMES DAILY WITH MEALS. 15 mL 3  . triamcinolone ointment (KENALOG) 0.1 % Apply 1 application topically 2 (two) times daily as needed. Limit use due risk of skin discoloration. 453.6 g 1   No current facility-administered medications on file prior to visit.   No Known Allergies Family History  Problem Relation Age of Onset  . Diabetes Father   . Depression Paternal Grandmother   . Depression Paternal  Grandfather   . Depression Cousin   . Breast cancer Neg Hx   . Arthritis/Rheumatoid Neg Hx    PE: BP (!) 140/100   Pulse (!) 104   Ht '5\' 3"'  (1.6 m)   Wt 130 lb 3.2 oz (59.1 kg)   SpO2 98%   BMI 23.06 kg/m  Wt Readings from Last 3 Encounters:  12/17/20 130 lb 3.2 oz (59.1 kg)  08/06/20 133 lb (60.3 kg)  07/10/20 134 lb (60.8 kg)   Constitutional: normal weight, in NAD Eyes: PERRLA, EOMI, no exophthalmos ENT: moist mucous membranes, + slight, symmetric thyromegaly, no cervical lymphadenopathy Cardiovascular: Tachycardia, RR, No MRG Respiratory: CTA B Gastrointestinal: abdomen soft, NT, ND, BS+ Musculoskeletal: no deformities, strength intact in all 4 Skin: moist, warm, no rashes Neurological: no tremor with outstretched hands, DTR normal in all 4   ASSESSMENT: 1. DM, insulin-dependent, uncontrolled, with complications - hyper- and hypo-glycemia - History of DKA  Component     Latest Ref Rng & Units 11/04/2016  Hemoglobin A1C      9.0  C-Peptide     0.80 - 3.85 ng/mL <0.10 (L)  Glucose, Fasting     65 - 99 mg/dL 40 (L)  Glutamic Acid Decarb Ab     <5 IU/mL >250 (H)  Pancreatic Islet Cell Antibody     <5 JDF Units <5   Glucose very low at the time of the blood draw, therefore, C-peptide is not interpretable. However, GAD antibodies are undetectably high, confirming type 1 diabetes.  2.  Palpable thyroid  PLAN:  1. Patient with history of uncontrolled type 1 diabetes, on basal-bolus insulin regimen, with increased insulin sensitivity, on low insulin doses. Her diabetes control is hindered by the fact that she has increased stress at work and also the fact that she is very busy and does not have time allotted to eat. At last visit, HbA1c was 7.9%, improved. At that time, per review of her CGM tracings, she still had variability during the night and night. She had fattier dinners and  we discussed that this may delay the absorption of her sugars so that sometimes, even if  she wants this correctly at the beginning of the meal, she may still develop hypoglycemia during the night. Alternatively, when she had more carbs, she may drop her sugars around 3 AM. Sugars appeared to be only slightly higher after breakfast but they were increasing after lunch. She was working on bolusing correctly, we discussed about the importance of bolusing 15 and even occasionally 30 minutes before the meal. -At a previous visit, I advised her to check with her insurance whether they would cover an [place], but she did not do so yet CGM interpretation: -At today's visit, we reviewed her CGM downloads: It appears that 54% of values are in target range (goal >70%), while 25% are higher than 180 (goal <25%), and 21% are lower than 70 (goal <4%).  The calculated average blood sugar is 146.  The projected HbA1c for the next 3 months (GMI) is 6.7. -Reviewing the day by day CGM trends there is a lot of variability between days, but reviewing the general patterns, the sugars appear to be low for the majority of the day but they increase after lunch and after dinner. She cannot inject her insulin 15 minutes before lunch due to being very busy and not having time allotted for eating. She is doing her best injecting NovoLog before the meal whenever she can. After dinner, sugars also increased, especially after 10 PM, and then plummet overnight. Based on the above pattern, I recommended increased consistency between different dates and times of meals, if possible, but, we also need to avoid low blood sugars as much as we can. Therefore, advised her to reduce her Lantus dose and also to use a different insulin to carb ratio, more lax, for whenever she does not have a lot of starches for meals. -  I suggested to:  Patient Instructions  Please decrease: - Lantus 5 units in a.m. and 5 units at bedtime - Novolog - ICR 1:12 for starches but use    1:15 otherwise  Try to bolus 15 to 30 minutes before each  meal.  Please come back for a follow-up appointment in 3 months.  - we checked her HbA1c: 7.1% (better, but likely due to too many lows) - advised to check sugars at different times of the day - 4x a day, rotating check times - advised for yearly eye exams >> she is UTD - return to clinic in 4 months  2.  Palpable thyroid -Thyroid feels symmetric and nonnodular on palpation -No neck compression -Latest TSH was normal: Lab Results  Component Value Date   TSH 1.76 06/14/2019  -We will continue to follow her clinically   Philemon Kingdom, MD PhD [facility] Endocrinology

## 2020-12-21 ENCOUNTER — Encounter: Payer: 59 | Admitting: Family

## 2020-12-24 ENCOUNTER — Encounter: Payer: 59 | Admitting: Family

## 2021-01-09 ENCOUNTER — Other Ambulatory Visit: Payer: Self-pay | Admitting: Family

## 2021-01-09 DIAGNOSIS — R928 Other abnormal and inconclusive findings on diagnostic imaging of breast: Secondary | ICD-10-CM

## 2021-01-09 DIAGNOSIS — N63 Unspecified lump in unspecified breast: Secondary | ICD-10-CM

## 2021-01-09 DIAGNOSIS — Z1231 Encounter for screening mammogram for malignant neoplasm of breast: Secondary | ICD-10-CM

## 2021-01-11 ENCOUNTER — Encounter: Payer: 59 | Admitting: Family

## 2021-02-07 ENCOUNTER — Other Ambulatory Visit: Payer: Self-pay

## 2021-02-07 ENCOUNTER — Ambulatory Visit
Admission: RE | Admit: 2021-02-07 | Discharge: 2021-02-07 | Disposition: A | Discharge status: Home / Self Care | Payer: 59 | Source: Ambulatory Visit | Attending: Family | Admitting: Family

## 2021-02-07 DIAGNOSIS — N63 Unspecified lump in unspecified breast: Secondary | ICD-10-CM | POA: Diagnosis present

## 2021-02-07 DIAGNOSIS — R928 Other abnormal and inconclusive findings on diagnostic imaging of breast: Secondary | ICD-10-CM

## 2021-02-07 DIAGNOSIS — Z1231 Encounter for screening mammogram for malignant neoplasm of breast: Secondary | ICD-10-CM | POA: Insufficient documentation

## 2021-02-08 ENCOUNTER — Other Ambulatory Visit (HOSPITAL_COMMUNITY)
Admission: RE | Admit: 2021-02-08 | Discharge: 2021-02-08 | Disposition: A | Discharge status: Home / Self Care | Payer: 59 | Source: Ambulatory Visit | Attending: Family | Admitting: Family

## 2021-02-08 ENCOUNTER — Encounter: Payer: Self-pay | Admitting: Family

## 2021-02-08 ENCOUNTER — Ambulatory Visit (INDEPENDENT_AMBULATORY_CARE_PROVIDER_SITE_OTHER): Payer: 59 | Admitting: Family

## 2021-02-08 VITALS — BP 122/82 | HR 100 | Temp 98.1°F | Ht 62.95 in | Wt 130.2 lb

## 2021-02-08 DIAGNOSIS — Z0000 Encounter for general adult medical examination without abnormal findings: Secondary | ICD-10-CM

## 2021-02-08 DIAGNOSIS — J45909 Unspecified asthma, uncomplicated: Secondary | ICD-10-CM | POA: Diagnosis not present

## 2021-02-08 NOTE — Patient Instructions (Signed)
Nice to see you!   Health Maintenance, Female Adopting a healthy lifestyle and getting preventive care are important in promoting health and wellness. Ask your health care provider about:  The right schedule for you to have regular tests and exams.  Things you can do on your own to prevent diseases and keep yourself healthy. What should I know about diet, weight, and exercise? Eat a healthy diet  Eat a diet that includes plenty of vegetables, fruits, low-fat dairy products, and lean protein.  Do not eat a lot of foods that are high in solid fats, added sugars, or sodium.   Maintain a healthy weight Body mass index (BMI) is used to identify weight problems. It estimates body fat based on height and weight. Your health care provider can help determine your BMI and help you achieve or maintain a healthy weight. Get regular exercise Get regular exercise. This is one of the most important things you can do for your health. Most adults should:  Exercise for at least 150 minutes each week. The exercise should increase your heart rate and make you sweat (moderate-intensity exercise).  Do strengthening exercises at least twice a week. This is in addition to the moderate-intensity exercise.  Spend less time sitting. Even light physical activity can be beneficial. Watch cholesterol and blood lipids Have your blood tested for lipids and cholesterol at 49 years of age, then have this test every 5 years. Have your cholesterol levels checked more often if:  Your lipid or cholesterol levels are high.  You are older than 49 years of age.  You are at high risk for heart disease. What should I know about cancer screening? Depending on your health history and family history, you may need to have cancer screening at various ages. This may include screening for:  Breast cancer.  Cervical cancer.  Colorectal cancer.  Skin cancer.  Lung cancer. What should I know about heart disease, diabetes,  and high blood pressure? Blood pressure and heart disease  High blood pressure causes heart disease and increases the risk of stroke. This is more likely to develop in people who have high blood pressure readings, are of African descent, or are overweight.  Have your blood pressure checked: ? Every 3-5 years if you are 49-28 years of age. ? Every year if you are 49 years old or older. Diabetes Have regular diabetes screenings. This checks your fasting blood sugar level. Have the screening done:  Once every three years after age 9 if you are at a normal weight and have a low risk for diabetes.  More often and at a younger age if you are overweight or have a high risk for diabetes. What should I know about preventing infection? Hepatitis B If you have a higher risk for hepatitis B, you should be screened for this virus. Talk with your health care provider to find out if you are at risk for hepatitis B infection. Hepatitis C Testing is recommended for:  Everyone born from [birth year] through 1964.  Anyone with known risk factors for hepatitis C. Sexually transmitted infections (STIs)  Get screened for STIs, including gonorrhea and chlamydia, if: ? You are sexually active and are younger than 49 years of age. ? You are older than 49 years of age and your health care provider tells you that you are at risk for this type of infection. ? Your sexual activity has changed since you were last screened, and you are at increased risk for chlamydia or  gonorrhea. Ask your health care provider if you are at risk.  Ask your health care provider about whether you are at high risk for HIV. Your health care provider may recommend a prescription medicine to help prevent HIV infection. If you choose to take medicine to prevent HIV, you should first get tested for HIV. You should then be tested every 3 months for as long as you are taking the medicine. Pregnancy  If you are about to stop having your period  (premenopausal) and you may become pregnant, seek counseling before you get pregnant.  Take 400 to 800 micrograms (mcg) of folic acid every day if you become pregnant.  Ask for birth control (contraception) if you want to prevent pregnancy. Osteoporosis and menopause Osteoporosis is a disease in which the bones lose minerals and strength with aging. This can result in bone fractures. If you are 11 years old or older, or if you are at risk for osteoporosis and fractures, ask your health care provider if you should:  Be screened for bone loss.  Take a calcium or vitamin D supplement to lower your risk of fractures.  Be given hormone replacement therapy (HRT) to treat symptoms of menopause. Follow these instructions at home: Lifestyle  Do not use any products that contain nicotine or tobacco, such as cigarettes, e-cigarettes, and chewing tobacco. If you need help quitting, ask your health care provider.  Do not use street drugs.  Do not share needles.  Ask your health care provider for help if you need support or information about quitting drugs. Alcohol use  Do not drink alcohol if: ? Your health care provider tells you not to drink. ? You are pregnant, may be pregnant, or are planning to become pregnant.  If you drink alcohol: ? Limit how much you use to 0-1 drink a day. ? Limit intake if you are breastfeeding.  Be aware of how much alcohol is in your drink. In the [place] one drink equals one 12 oz bottle of beer (355 mL), one 5 oz glass of wine (148 mL), or one 1 oz glass of hard liquor (44 mL). General instructions  Schedule regular health, dental, and eye exams.  Stay current with your vaccines.  Tell your health care provider if: ? You often feel depressed. ? You have ever been abused or do not feel safe at home. Summary  Adopting a healthy lifestyle and getting preventive care are important in promoting health and wellness.  Follow your health care provider's  instructions about healthy diet, exercising, and getting tested or screened for diseases.  Follow your health care provider's instructions on monitoring your cholesterol and blood pressure. This information is not intended to replace advice given to you by your health care provider. Make sure you discuss any questions you have with your health care provider. Document Revised: 11/17/2018 Document Reviewed: 11/17/2018 Elsevier Patient Education  2021 [employer].

## 2021-02-08 NOTE — Progress Notes (Signed)
Subjective:    Patient ID: Mary Haney, female    DOB: 1971/09/30, 50 y.o.   MRN: [id number]  CC: Mary Haney is a 50 y.o. female who presents today for physical exam.    HPI: Feels well today No complaints  Asthma- breathing well, hasnt need to use albuterol in 2 years.   DM type 1- follows with Dr Cruzita Lederer; last a1c 7.1  Colorectal Cancer Screening: UTD Dr Marius Ditch repeat in 3 years Breast Cancer Screening: Mammogram UTD Cervical Cancer Screening: due. No pelvic pain.          Tetanus - UTD        Pneumococcal - complete  Labs: Screening labs today. Exercise: Gets regular exercise with walking exercise 3 times per day, 3 miles. No cp, sob.    Smoking/tobacco use: smoker.     HISTORY:  Past Medical History:  Diagnosis Date  . Alcoholic pancreatitis   . Asthma    Has albuterol inhaler  . Diabetes mellitus without complication ([place])    Type 2 diagnosed 2013    Past Surgical History:  Procedure Laterality Date  . COLONOSCOPY WITH PROPOFOL N/A 02/11/2018   Procedure: COLONOSCOPY WITH PROPOFOL;  Surgeon: Lin Landsman, MD;  Location: [facility] ENDOSCOPY;  Service: Gastroenterology;  Laterality: N/A;  . NO PAST SURGERIES     Family History  Problem Relation Age of Onset  . Diabetes Father   . Depression Paternal Grandmother   . Depression Paternal Grandfather   . Depression Cousin   . Breast cancer Neg Hx   . Arthritis/Rheumatoid Neg Hx       ALLERGIES: Patient has no known allergies.  Current Outpatient Medications on File Prior to Visit  Medication Sig Dispense Refill  . albuterol (PROAIR HFA) 108 (90 Base) MCG/ACT inhaler Inhale 2 puffs into the lungs every 6 (six) hours as needed for wheezing or shortness of breath. 1 Inhaler 1  . cetirizine (ZYRTEC) 10 MG tablet Take 10 mg by mouth daily as needed for allergies.     . Continuous Blood Gluc Receiver (FREESTYLE LIBRE 2 READER) DEVI For continuous glucose monitoring. 1 each 0  . Continuous Blood Gluc  Sensor (FREESTYLE LIBRE 2 SENSOR) MISC Apply new sensor every 14 days 6 each 2  . Glucagon 3 MG/DOSE POWD Place 3 mg into the nose once as needed for up to 1 dose. 1 each 11  . hydrocortisone (ANUSOL-HC) 25 MG suppository Place 1 suppository (25 mg total) rectally 2 (two) times daily. 12 suppository 0  . insulin glargine (LANTUS) 100 UNIT/ML injection INJECT 13 UNITS INTO THE SKIN as advised 10 mL 11  . Insulin Pen Needle (B-D UF III MINI PEN NEEDLES) 31G X 5 MM MISC USE 5x WHEN TAKING INSULIN. 300 each 3  . Insulin Syringe-Needle U-100 (BD INSULIN SYRINGE U/F) 30G X 1/2" 0.5 ML MISC USE 2x a day WHEN TAKING INSULIN 200 each 3  . NOVOLOG FLEXPEN 100 UNIT/ML FlexPen INJECT 2-9 UNITS INTO THE SKIN 3 (THREE) TIMES DAILY WITH MEALS. 15 mL 3  . triamcinolone ointment (KENALOG) 0.1 % Apply 1 application topically 2 (two) times daily as needed. Limit use due risk of skin discoloration. 453.6 g 1   No current facility-administered medications on file prior to visit.    Social History   Tobacco Use  . Smoking status: Current Every Day Smoker    Packs/day: 0.50    Years: 25.00    Pack years: 12.50    Types: Cigarettes  . Smokeless  tobacco: Never Used  . Tobacco comment: Has Chantix  Substance Use Topics  . Alcohol use: Yes    Alcohol/week: 3.0 standard drinks    Types: 3 Cans of beer per week  . Drug use: No    Review of Systems  Constitutional: Negative for chills, fever and unexpected weight change.  HENT: Negative for congestion.   Respiratory: Negative for cough.   Cardiovascular: Negative for chest pain, palpitations and leg swelling.  Gastrointestinal: Negative for nausea and vomiting.  Genitourinary: Negative for pelvic pain.  Musculoskeletal: Negative for arthralgias and myalgias.  Skin: Negative for rash.  Neurological: Negative for headaches.  Hematological: Negative for adenopathy.  Psychiatric/Behavioral: Negative for confusion.      Objective:    BP 122/82   Pulse 100    Temp 98.1 F (36.7 C)   Ht 5' 2.95" (1.599 m)   Wt 130 lb 3.2 oz (59.1 kg)   SpO2 99%   BMI 23.10 kg/m   BP Readings from Last 3 Encounters:  02/08/21 122/82  12/17/20 (!) 140/100  08/06/20 140/80   Wt Readings from Last 3 Encounters:  02/08/21 130 lb 3.2 oz (59.1 kg)  12/17/20 130 lb 3.2 oz (59.1 kg)  08/06/20 133 lb (60.3 kg)    Physical Exam Vitals reviewed.  Constitutional:      Appearance: She is well-developed and well-nourished.  Eyes:     Conjunctiva/sclera: Conjunctivae normal.  Neck:     Thyroid: No thyroid mass or thyromegaly.  Cardiovascular:     Rate and Rhythm: Normal rate and regular rhythm.     Pulses: Normal pulses.     Heart sounds: Normal heart sounds.  Pulmonary:     Effort: Pulmonary effort is normal.     Breath sounds: Normal breath sounds. No wheezing, rhonchi or rales.     Comments: No masses or asymmetry appreciated during CBE. Chest:  Breasts: Breasts are symmetrical.     Right: No inverted nipple, mass, nipple discharge, skin change or tenderness.     Left: No inverted nipple, mass, nipple discharge, skin change or tenderness.    Genitourinary:    Cervix: No cervical motion tenderness, discharge or friability.     Uterus: Not enlarged, not fixed and not tender.      Adnexa:        Right: No mass, tenderness or fullness.         Left: No mass, tenderness or fullness.       Comments: Pap performed. No CMT. Unable to appreciated ovaries. Lymphadenopathy:     Head:     Right side of head: No submental, submandibular, tonsillar, preauricular, posterior auricular or occipital adenopathy.     Left side of head: No submental, submandibular, tonsillar, preauricular, posterior auricular or occipital adenopathy.     Cervical:     Right cervical: No superficial, deep or posterior cervical adenopathy.    Left cervical: No superficial, deep or posterior cervical adenopathy.     Upper Body:  No axillary adenopathy present.    Right upper body:  No pectoral or lateral adenopathy.     Left upper body: No pectoral or lateral adenopathy.  Skin:    General: Skin is warm and dry.  Neurological:     Mental Status: She is alert.  Psychiatric:        Mood and Affect: Mood and affect normal.        Speech: Speech normal.        Behavior: Behavior normal.  Thought Content: Thought content normal.        Assessment & Plan:   Problem List Items Addressed This Visit      Respiratory   Asthma    Excellent control. Will follow        Other   Routine general medical examination at a health care facility    CBE and pap performed. Encouraged continued exercise.        Other Visit Diagnoses    Routine adult health maintenance    -  Primary   Relevant Orders   Cytology - PAP( [place])   CBC with Differential/Platelet   Comprehensive metabolic panel   Lipid panel   Microalbumin / creatinine urine ratio   TSH       I have discontinued Margarite Gouge Branum's fluconazole. I am also having her maintain her cetirizine, hydrocortisone, albuterol, insulin glargine, B-D UF III MINI PEN NEEDLES, triamcinolone ointment, FreeStyle Libre 2 Reader, NovoLOG FlexPen, BD Insulin Syringe U/F, FreeStyle Libre 2 Sensor, and Glucagon.   No orders of the defined types were placed in this encounter.   Return precautions given.   Risks, benefits, and alternatives of the medications and treatment plan prescribed today were discussed, and patient expressed understanding.   Education regarding symptom management and diagnosis given to patient on AVS.   Continue to follow with Burnard Hawthorne, FNP for routine health maintenance.   Mary Haney and I agreed with plan.   Mable Paris, FNP

## 2021-02-12 LAB — CYTOLOGY - PAP
Comment: NEGATIVE
Diagnosis: NEGATIVE
High risk HPV: NEGATIVE

## 2021-02-12 NOTE — Assessment & Plan Note (Signed)
CBE and pap performed. Encouraged continued exercise.

## 2021-02-12 NOTE — Assessment & Plan Note (Signed)
Excellent control. Will follow

## 2021-02-25 ENCOUNTER — Other Ambulatory Visit: Payer: Self-pay

## 2021-02-25 ENCOUNTER — Other Ambulatory Visit (INDEPENDENT_AMBULATORY_CARE_PROVIDER_SITE_OTHER): Payer: 59

## 2021-02-25 DIAGNOSIS — Z0000 Encounter for general adult medical examination without abnormal findings: Secondary | ICD-10-CM

## 2021-02-26 LAB — CBC WITH DIFFERENTIAL/PLATELET
Absolute Monocytes: 497 cells/uL (ref 200–950)
Basophils Absolute: 51 cells/uL (ref 0–200)
Basophils Relative: 1.1 %
Eosinophils Absolute: 28 cells/uL (ref 15–500)
Eosinophils Relative: 0.6 %
HCT: 38.7 % (ref 35.0–45.0)
Hemoglobin: 13.2 g/dL (ref 11.7–15.5)
Lymphs Abs: 2309 cells/uL (ref 850–3900)
MCH: 30.9 pg (ref 27.0–33.0)
MCHC: 34.1 g/dL (ref 32.0–36.0)
MCV: 90.6 fL (ref 80.0–100.0)
MPV: 8.7 fL (ref 7.5–12.5)
Monocytes Relative: 10.8 %
Neutro Abs: 1716 cells/uL (ref 1500–7800)
Neutrophils Relative %: 37.3 %
Platelets: 383 10*3/uL (ref 140–400)
RBC: 4.27 10*6/uL (ref 3.80–5.10)
RDW: 13.2 % (ref 11.0–15.0)
Total Lymphocyte: 50.2 %
WBC: 4.6 10*3/uL (ref 3.8–10.8)

## 2021-02-26 LAB — COMPREHENSIVE METABOLIC PANEL
AG Ratio: 1.6 (calc) (ref 1.0–2.5)
ALT: 9 U/L (ref 6–29)
AST: 20 U/L (ref 10–35)
Albumin: 4.5 g/dL (ref 3.6–5.1)
Alkaline phosphatase (APISO): 66 U/L (ref 31–125)
BUN: 7 mg/dL (ref 7–25)
CO2: 26 mmol/L (ref 20–32)
Calcium: 9.1 mg/dL (ref 8.6–10.2)
Chloride: 101 mmol/L (ref 98–110)
Creat: 0.7 mg/dL (ref 0.50–1.10)
Globulin: 2.8 g/dL (calc) (ref 1.9–3.7)
Glucose, Bld: 105 mg/dL — ABNORMAL HIGH (ref 65–99)
Potassium: 4.4 mmol/L (ref 3.5–5.3)
Sodium: 139 mmol/L (ref 135–146)
Total Bilirubin: 0.3 mg/dL (ref 0.2–1.2)
Total Protein: 7.3 g/dL (ref 6.1–8.1)

## 2021-02-26 LAB — LIPID PANEL
Cholesterol: 138 mg/dL (ref <200)
HDL: 60 mg/dL (ref >=50)
LDL Cholesterol (Calc): 39 mg/dL (calc)
Non-HDL Cholesterol (Calc): 78 mg/dL (calc) (ref <130)
Total CHOL/HDL Ratio: 2.3 (calc) (ref <5.0)
Triglycerides: 372 mg/dL — ABNORMAL HIGH (ref <150)

## 2021-02-26 LAB — MICROALBUMIN / CREATININE URINE RATIO
Creatinine, Urine: 80 mg/dL (ref 20–275)
Microalb Creat Ratio: 40 mcg/mg creat — ABNORMAL HIGH (ref <30)
Microalb, Ur: 3.2 mg/dL

## 2021-02-26 LAB — TSH: TSH: 0.75 mIU/L

## 2021-02-27 ENCOUNTER — Telehealth: Payer: Self-pay

## 2021-02-27 NOTE — Telephone Encounter (Signed)
LMTCB for lab results.  

## 2021-02-28 NOTE — Telephone Encounter (Signed)
Patient was returning call for lab results 

## 2021-02-28 NOTE — Telephone Encounter (Signed)
LMTCB for lab results.  

## 2021-03-01 ENCOUNTER — Other Ambulatory Visit: Payer: Self-pay | Admitting: Family

## 2021-03-01 DIAGNOSIS — R809 Proteinuria, unspecified: Secondary | ICD-10-CM

## 2021-03-18 ENCOUNTER — Encounter: Payer: Self-pay | Admitting: Internal Medicine

## 2021-03-18 ENCOUNTER — Other Ambulatory Visit: Payer: Self-pay

## 2021-03-18 ENCOUNTER — Ambulatory Visit (INDEPENDENT_AMBULATORY_CARE_PROVIDER_SITE_OTHER): Payer: 59 | Admitting: Internal Medicine

## 2021-03-18 VITALS — BP 140/90 | HR 109 | Ht 62.95 in | Wt 124.8 lb

## 2021-03-18 DIAGNOSIS — E0789 Other specified disorders of thyroid: Secondary | ICD-10-CM | POA: Diagnosis not present

## 2021-03-18 DIAGNOSIS — E1065 Type 1 diabetes mellitus with hyperglycemia: Secondary | ICD-10-CM | POA: Diagnosis not present

## 2021-03-18 LAB — POCT GLYCOSYLATED HEMOGLOBIN (HGB A1C): Hemoglobin A1C: 6.5 % — AB (ref 4.0–5.6)

## 2021-03-18 MED ORDER — FREESTYLE PRECISION NEO TEST VI STRP: ORAL_STRIP | 11 refills | Status: DC

## 2021-03-18 NOTE — Progress Notes (Signed)
Patient ID: Mary Haney, female   DOB: 1971-06-09, 50 y.o.   MRN: [id number]  This visit occurred during the SARS-CoV-2 public health emergency.  Safety protocols were in place, including screening questions prior to the visit, additional usage of staff PPE, and extensive cleaning of exam room while observing appropriate contact time as indicated for disinfecting solutions.   HPI: Mary Haney is a 50 y.o.-year-old female, returning for f/u for DM, dx 2013, insulin-dependent, uncontrolled, without long term complications. Last visit was 3 months ago. PCP: Dr. Caryl Haney  Interim history: She continues to be very busy at work, but she has no complaints at this visit.  No increased urination, blurry vision, GI symptoms.   Her weight continues to decrease and she appears to have lost 6 pounds, however, since last visit.  Reviewed history: In 07/2012 she went to the ED because she was not feeling well. She was found to have a blood glucose level of 600 + and was admitted with DKA. She was followed by Dr. Gabriel Haney immediately following initial diagnosis, then switched to [facility] endocrinology.   In 10/2013, due to lack of finances, she was stretching the insulin doses >> sugars high >> low CBG 25-26 >> ED.    She was admitted in 04/2016 for DKA.  At the end of 2017, she met the DM educator and nutritionist >> decided for Omnipod pump >> but nobody called her back and then she could not afford it anymore.  She decided against that insulin pump, afterwards, but she now has a CGM.  She is usually very active at work during the day.  Reviewed HbA1c levels: Lab Results  Component Value Date   HGBA1C 7.1 (A) 12/17/2020   HGBA1C 7.9 (H) 07/25/2020   HGBA1C 8.8 (A) 04/02/2020   HGBA1C 8.7 (A) 12/27/2019   HGBA1C 6.9 (A) 09/20/2019   HGBA1C 8.5 (A) 06/14/2019   HGBA1C 9.1 (A) 02/15/2019   HGBA1C 7.4 (A) 06/22/2018   HGBA1C 7.7 03/18/2018   HGBA1C 7.9 12/17/2017   HGBA1C 7.9 09/15/2017    HGBA1C 8.1 04/02/2017   HGBA1C 9.0 11/04/2016   HGBA1C 10.8 (H) 08/07/2016   HGBA1C 10.3 (H) 05/07/2016   HGBA1C 7.8 (H) 04/03/2015   HGBA1C 9.0 (H) 07/20/2014   HGBA1C 8.5 (H) 01/04/2014   HGBA1C 13.9 (H) 06/13/2012   Pt is on a regimen of: - Lantus 5 units in a.m. and 5 units at bedtime - Novolog - ICR >> actually using an ICR of 1: 10 for meals Ends up with: 2-4, occas. 5 units per meal  She checks her sugars more than 4 times a day with her freestyle libre CGM:   Prev.:  Previously:  Previously:  Previously:   Lowest sugar was  37 (very busy, forgot to eat) ... >> 43; she has hypoglycemia awareness in the 60s. Highest sugar was 350 >> 400.  Has ReliOn meter.  Pt's meals are: - Breakfast: oatmeal, yoghurt, sausage bisquit - Lunch: salad, soup, sandwiches  - Dinner: meat + starch + vegetables - Snacks: yoghurt, peanut crackers, nuts  She is very busy at work and does not have time allotted for lunch. She continues to grab lunch to go. She cannot not inject insulin 15 minutes before lunch  -No CKD: Lab Results  Component Value Date   BUN 7 02/25/2021   Lab Results  Component Value Date   CREATININE 0.70 02/25/2021   -She has a history of dyslipidemia: Lab Results  Component Value Date   CHOL 138  02/25/2021   HDL 60 02/25/2021   LDLCALC 39 02/25/2021   LDLDIRECT 59.0 06/14/2019   TRIG 372 (H) 02/25/2021   CHOLHDL 2.3 02/25/2021   - last eye exam was in 06/2020: No DR - no numbness and tingling in her feet  Latest TSH was normal: Lab Results  Component Value Date   TSH 0.75 02/25/2021   Her thyroid remains palpable.  Pt denies: - feeling nodules in neck - hoarseness - dysphagia - choking - SOB with lying down  ROS: Constitutional: no weight gain/no weight loss, no fatigue, no subjective hyperthermia, no subjective hypothermia Eyes: no blurry vision, no xerophthalmia ENT: no sore throat, + see HPI Cardiovascular: no CP/no SOB/no  palpitations/no leg swelling Respiratory: no cough/no SOB/no wheezing Gastrointestinal: no N/no V/no D/no C/no acid reflux Musculoskeletal: no muscle aches/no joint aches Skin: no rashes, no hair loss Neurological: no tremors/no numbness/no tingling/no dizziness  I reviewed pt's medications, allergies, PMH, social hx, family hx, and changes were documented in the history of present illness. Otherwise, unchanged from my initial visit note.  Past Medical History:  Diagnosis Date  . Alcoholic pancreatitis   . Asthma    Has albuterol inhaler  . Diabetes mellitus without complication ([place])    Type 2 diagnosed 2013   Past Surgical History:  Procedure Laterality Date  . COLONOSCOPY WITH PROPOFOL N/A 02/11/2018   Procedure: COLONOSCOPY WITH PROPOFOL;  Surgeon: Mary Landsman, MD;  Location: [facility] ENDOSCOPY;  Service: Gastroenterology;  Laterality: N/A;  . NO PAST SURGERIES     History   Social History  . Marital Status: Single    Spouse Name: N/A    Number of Children: 0  . Years of Education: 14   Occupational History  . Take Out Specialist     [employer]   Social History Main Topics  . Smoking status: Current Every Day Smoker -- 0.50 packs/day for 25 years    Types: Cigarettes  . Smokeless tobacco: Never Used  . Alcohol Use: 1.8 oz/week    3 Cans of beer per week  . Drug Use: No  . Sexual Activity: Yes    Birth Control/ Protection: Condom   Social History Narrative   Mary Haney was born in [place], [place]. She moved with her family to [place] at age 48. She attended 2 years at [employer] and was majoring in [place]. Her goal is to become a Optometrist in the [employer]. 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 Mary Haney lives with her boyfriend, Mary Haney. They are considering getting married soon. She enjoys sketching outdoor scenery, cartoons and loves to read.   Current Outpatient Medications on File Prior to Visit  Medication Sig  Dispense Refill  . albuterol (PROAIR HFA) 108 (90 Base) MCG/ACT inhaler Inhale 2 puffs into the lungs every 6 (six) hours as needed for wheezing or shortness of breath. 1 Inhaler 1  . cetirizine (ZYRTEC) 10 MG tablet Take 10 mg by mouth daily as needed for allergies.     . Continuous Blood Gluc Receiver (FREESTYLE LIBRE 2 READER) DEVI For continuous glucose monitoring. 1 each 0  . Continuous Blood Gluc Sensor (FREESTYLE LIBRE 2 SENSOR) MISC Apply new sensor every 14 days 6 each 2  . Glucagon 3 MG/DOSE POWD Place 3 mg into the nose once as needed for up to 1 dose. 1 each 11  . hydrocortisone (ANUSOL-HC) 25 MG suppository Place 1 suppository (25 mg total) rectally 2 (two) times daily.  12 suppository 0  . insulin glargine (LANTUS) 100 UNIT/ML injection INJECT 13 UNITS INTO THE SKIN as advised 10 mL 11  . Insulin Pen Needle (B-D UF III MINI PEN NEEDLES) 31G X 5 MM MISC USE 5x WHEN TAKING INSULIN. 300 each 3  . Insulin Syringe-Needle U-100 (BD INSULIN SYRINGE U/F) 30G X 1/2" 0.5 ML MISC USE 2x a day WHEN TAKING INSULIN 200 each 3  . NOVOLOG FLEXPEN 100 UNIT/ML FlexPen INJECT 2-9 UNITS INTO THE SKIN 3 (THREE) TIMES DAILY WITH MEALS. 15 mL 3  . triamcinolone ointment (KENALOG) 0.1 % Apply 1 application topically 2 (two) times daily as needed. Limit use due risk of skin discoloration. 453.6 g 1   No current facility-administered medications on file prior to visit.   No Known Allergies Family History  Problem Relation Age of Onset  . Diabetes Father   . Depression Paternal Grandmother   . Depression Paternal Grandfather   . Depression Cousin   . Breast cancer Neg Hx   . Arthritis/Rheumatoid Neg Hx    PE: BP 140/90 (BP Location: Right Arm, Patient Position: Sitting, Cuff Size: Normal)   Pulse (!) 109   Ht 5' 2.95" (1.599 m)   Wt 124 lb 12.8 oz (56.6 kg)   SpO2 97%   BMI 22.14 kg/m  Wt Readings from Last 3 Encounters:  03/18/21 124 lb 12.8 oz (56.6 kg)  02/08/21 130 lb 3.2 oz (59.1 kg)   12/17/20 130 lb 3.2 oz (59.1 kg)   Constitutional: normal weight, in NAD Eyes: PERRLA, EOMI, no exophthalmos ENT: moist mucous membranes, + slight, symmetric thyromegaly, no cervical lymphadenopathy Cardiovascular: Tachycardia, RR, No MRG Respiratory: CTA B Gastrointestinal: abdomen soft, NT, ND, BS+ Musculoskeletal: no deformities, strength intact in all 4 Skin: moist, warm, no rashes Neurological: no tremor with outstretched hands, DTR normal in all 4   ASSESSMENT: 1. DM, insulin-dependent, uncontrolled, with complications - hyper- and hypo-glycemia - History of DKA  Component     Latest Ref Rng & Units 11/04/2016  Hemoglobin A1C      9.0  C-Peptide     0.80 - 3.85 ng/mL <0.10 (L)  Glucose, Fasting     65 - 99 mg/dL 40 (L)  Glutamic Acid Decarb Ab     <5 IU/mL >250 (H)  Pancreatic Islet Cell Antibody     <5 JDF Units <5   Glucose very low at the time of the blood draw, therefore, C-peptide is not interpretable. However, GAD antibodies are undetectably high, confirming type 1 diabetes.  2.  Palpable thyroid  PLAN:  1. Patient with history of uncontrolled type 1 diabetes, on basal-bolus insulin regimen, with increased insulin sensitivity,  managed with low insulin doses.  Her diabetes control is hindered by the fact that she has increased stress at work and is very busy and does not have time allotted to eat.  However, her sugars started to improve after she obtains a CGM and at last visit HbA1c was 7.1%, close to goal, however, this was most likely due to low blood sugars (21% lower than 70).  At that time, there was a lot of variability between different days, but reviewing the general patterns of the download the CGM tracings, it appears that sugars were low for the majority of the day and they were increasing after lunch and after dinner.  She was doing her best injecting NovoLog before the meal whenever she can, but most of the time, she could not inject it 15 minutes  before the meal when at work and sometimes even at home, for dinner.  Therefore, sugars only started to increase after initial hyperglycemia after dinner and they were plummeting overnight.  We discussed about the importance of maintaining consistency of her meals and bolusing but I also advised her to reduce her Lantus dose and use a different insulin to carb ratio whenever she did not have a lot of starches for meals. CGM interpretation: -At today's visit, we reviewed her CGM downloads: It appears that 52% of values are in target range (goal >70%), while 23% are higher than 180 (goal <25%), and 25% are lower than 70 (goal <4%).  The calculated average blood sugar is 133.  The projected HbA1c for the next 3 months (GMI) is 6.5%. -Reviewing the CGM trends, it appears that her sugars are mostly low throughout the day but she has a large hyperglycemic peak after approximately 11 AM.  Upon questioning, she is having a smoothie in the morning which she usually covers with insulin and then she has peanut butter crackers with grapes around 10-11 AM as a snack, which is not covering with insulin.  In my opinion, this is probably the cause for her significant increase in blood sugars midday.  She then boluses for lunch and her blood sugars plummeted before dinnertime.  Upon questioning, she is using a stricter insulin to carb ratio than recommended for the ease of calculation.  I strongly advised her to relax this to 1-12-with starches and 1-15 otherwise.  Given examples of different meals and how to use the insulin to carb ratios for these.  Her sugars I have also occasionally increasing above 200 in the middle of the night but she also has many lows around this time.  Upon questioning she is drinking orange juice to correct low blood sugars that this is the reason why the sugars increase around 1 AM.  Therefore, for now, we will reduce her NovoLog doses in an effort to eliminate her lows.  The midday highs could also  be eliminated if she boluses for her crackers and eliminated grapes.  I also advised her to try to eliminate the smoothie, since this is more difficult for the body to handle and she may have either highs or lows in the morning after she drinks it. -I also strongly suggested an insulin pump and also CGM.  I advised her to check with her insurance to see which ones are covered and to let me know so we can start the process to get her on an insulin pump.  She is still undecided about this but will think about it and let me know. -  I suggested to:  Patient Instructions  Please continue: - Lantus 5 units in a.m. and 5 units at bedtime  Change: - Novolog - ICR 1:12 for starches (including crackers) but use:    1:15 otherwise  Try to bolus 15 to 30 minutes before each meal.  Stop grapes in am. Try to eat solid meals.   Please come back for a follow-up appointment in 3-4 months.  - we checked her HbA1c: 6.5% (lower) - advised to check sugars at different times of the day - 4x a day, rotating check times - advised for yearly eye exams >> she is UTD - return to clinic in 3-4 months  2.  Palpable thyroid -Thyroid feels symmetric and nonnodular on palpation -She denies neck compression symptoms -Latest TSH was recently normal: Lab Results  Component Value Date  TSH 0.75 02/25/2021  -We will continue to follow her clinically   Philemon Kingdom, MD PhD [facility]

## 2021-03-18 NOTE — Patient Instructions (Addendum)
Please continue: - Lantus 5 units in a.m. and 5 units at bedtime  Change: - Novolog - ICR 1:12 for starches (including crackers) but use:    1:15 otherwise  Try to bolus 15 to 30 minutes before each meal.  Stop grapes in am. Try to eat solid meals.   Please come back for a follow-up appointment in 3-4 months.

## 2021-06-03 ENCOUNTER — Other Ambulatory Visit: Payer: Self-pay | Admitting: Internal Medicine

## 2021-06-03 ENCOUNTER — Other Ambulatory Visit (HOSPITAL_COMMUNITY): Payer: Self-pay | Admitting: Nephrology

## 2021-06-03 ENCOUNTER — Other Ambulatory Visit: Payer: Self-pay | Admitting: Nephrology

## 2021-06-03 DIAGNOSIS — R809 Proteinuria, unspecified: Secondary | ICD-10-CM

## 2021-06-03 DIAGNOSIS — R8290 Unspecified abnormal findings in urine: Secondary | ICD-10-CM

## 2021-06-03 DIAGNOSIS — E1122 Type 2 diabetes mellitus with diabetic chronic kidney disease: Secondary | ICD-10-CM

## 2021-06-04 ENCOUNTER — Ambulatory Visit
Admission: RE | Admit: 2021-06-04 | Discharge: 2021-06-04 | Disposition: A | Discharge status: Home / Self Care | Payer: 59 | Source: Ambulatory Visit | Attending: Nephrology | Admitting: Nephrology

## 2021-06-04 ENCOUNTER — Other Ambulatory Visit: Payer: Self-pay

## 2021-06-04 DIAGNOSIS — E1122 Type 2 diabetes mellitus with diabetic chronic kidney disease: Secondary | ICD-10-CM | POA: Diagnosis present

## 2021-06-04 DIAGNOSIS — R8290 Unspecified abnormal findings in urine: Secondary | ICD-10-CM | POA: Diagnosis present

## 2021-06-04 DIAGNOSIS — R809 Proteinuria, unspecified: Secondary | ICD-10-CM | POA: Insufficient documentation

## 2021-07-29 ENCOUNTER — Encounter: Payer: Self-pay | Admitting: Internal Medicine

## 2021-07-29 ENCOUNTER — Other Ambulatory Visit: Payer: Self-pay

## 2021-07-29 ENCOUNTER — Ambulatory Visit (INDEPENDENT_AMBULATORY_CARE_PROVIDER_SITE_OTHER): Payer: 59 | Admitting: Internal Medicine

## 2021-07-29 VITALS — BP 138/88 | HR 97 | Ht 62.95 in | Wt 123.6 lb

## 2021-07-29 DIAGNOSIS — E1065 Type 1 diabetes mellitus with hyperglycemia: Secondary | ICD-10-CM

## 2021-07-29 DIAGNOSIS — E0789 Other specified disorders of thyroid: Secondary | ICD-10-CM

## 2021-07-29 DIAGNOSIS — E049 Nontoxic goiter, unspecified: Secondary | ICD-10-CM

## 2021-07-29 LAB — POCT GLYCOSYLATED HEMOGLOBIN (HGB A1C): Hemoglobin A1C: 6.1 % — AB (ref 4.0–5.6)

## 2021-07-29 MED ORDER — GLUCAGON 3 MG/DOSE NA POWD: 3.0000 mg | Freq: Once | NASAL | 11 refills | Status: DC | PRN

## 2021-07-29 NOTE — Progress Notes (Signed)
Patient ID: Mary Haney, female   DOB: Jul 16, 1971, 50 y.o.   MRN: [id number]  This visit occurred during the SARS-CoV-2 public health emergency.  Safety protocols were in place, including screening questions prior to the visit, additional usage of staff PPE, and extensive cleaning of exam room while observing appropriate contact time as indicated for disinfecting solutions.   HPI: Mary Haney is a 50 y.o.-year-old female, returning for f/u for DM, dx 2013, insulin-dependent, uncontrolled, without long term complications. Last visit was 4 months ago. PCP: Dr. Caryl Bis  Interim history: She continues to be very busy at work.  She did not have time of the summer but will take some in October. Since last visit, she eliminated her a.m. snack.  Sugars improved, but they are still dropping quite low throughout the day. No increased urination, blurry vision, nausea, chest pain.  Reviewed history: In 07/2012 she went to the ED because she was not feeling well. She was found to have a blood glucose level of 600 + and was admitted with DKA. She was followed by [name] immediately following initial diagnosis, then switched to [facility] endocrinology.   In 10/2013, due to lack of finances, she was stretching the insulin doses >> sugars high >> low CBG 25-26 >> ED.    She was admitted in 04/2016 for DKA.  At the end of 2017, she met the DM educator and nutritionist >> decided for Omnipod pump >> but nobody called her back and then she could not afford it anymore.  She decided against that insulin pump, afterwards, but she now has a CGM.  She is usually very active at work during the day.  Reviewed HbA1c levels: Lab Results  Component Value Date   HGBA1C 7.1 (A) 12/17/2020   HGBA1C 7.9 (H) 07/25/2020   HGBA1C 8.8 (A) 04/02/2020   HGBA1C 8.7 (A) 12/27/2019   HGBA1C 6.9 (A) 09/20/2019   HGBA1C 8.5 (A) 06/14/2019   HGBA1C 9.1 (A) 02/15/2019   HGBA1C 7.4 (A) 06/22/2018   HGBA1C 7.7  03/18/2018   HGBA1C 7.9 12/17/2017   HGBA1C 7.9 09/15/2017   HGBA1C 8.1 04/02/2017   HGBA1C 9.0 11/04/2016   HGBA1C 10.8 (H) 08/07/2016   HGBA1C 10.3 (H) 05/07/2016   HGBA1C 7.8 (H) 04/03/2015   HGBA1C 9.0 (H) 07/20/2014   HGBA1C 8.5 (H) 01/04/2014   HGBA1C 13.9 (H) 06/13/2012   Pt is on a regimen of: - Lantus 5 units twice a day - Novolog 15 to 30 minutes before each meal - ICR 1:12  for starches (including crackers) but use:    1:15 otherwise >> she changed to 1:13 for all meals since last OV  She checks her sugars more than 4 times a day with her freestyle libre CGM:   Initially:   Lowest sugar was  37 (very busy, forgot to eat) ... >> 49 >> 40s; she has hypoglycemia awareness in the 49s.  She tells me that she does not have a glucagon kit at home. Highest sugar was 350 >> 400 >> 280.  Has ReliOn meter.  Pt's meals are: - coffee + cream + stevia right after she wakes up at 6:45 - Breakfast: oatmeal, yoghurt, sausage bisquit - Lunch: salad, soup, sandwiches - Dinner: meat + starch + vegetables - Snacks: yoghurt, peanut crackers, nuts  She is very busy at work and does not have time allotted for lunch. She continues to grab lunch to go. She cannot not inject insulin 15 minutes before lunch many times.  -No  CKD: Lab Results  Component Value Date   BUN 7 02/25/2021   Lab Results  Component Value Date   CREATININE 0.70 02/25/2021   -She has a history of dyslipidemia: Lab Results  Component Value Date   CHOL 138 02/25/2021   HDL 60 02/25/2021   LDLCALC 39 02/25/2021   LDLDIRECT 59.0 06/14/2019   TRIG 372 (H) 02/25/2021   CHOLHDL 2.3 02/25/2021   - last eye exam was in 06/2020: No DR  - no numbness and tingling in her feet  Latest TSH was normal: Lab Results  Component Value Date   TSH 0.75 02/25/2021   Her thyroid remains palpable.  Pt denies: - feeling nodules in neck - hoarseness - dysphagia - choking - SOB with lying  down  ROS: Constitutional: no weight gain/no weight loss, no fatigue, no subjective hyperthermia, no subjective hypothermia Eyes: no blurry vision, no xerophthalmia ENT: no sore throat, + see HPI Cardiovascular: no CP/no SOB/no palpitations/no leg swelling Respiratory: no cough/no SOB/no wheezing Gastrointestinal: no N/no V/no D/no C/no acid reflux Musculoskeletal: no muscle aches/no joint aches Skin: no rashes, no hair loss Neurological: no tremors/no numbness/no tingling/no dizziness  I reviewed pt's medications, allergies, PMH, social hx, family hx, and changes were documented in the history of present illness. Otherwise, unchanged from my initial visit note.  Past Medical History:  Diagnosis Date   Alcoholic pancreatitis    Asthma    Has albuterol inhaler   Diabetes mellitus without complication ([place])    Type 2 diagnosed 2013   Past Surgical History:  Procedure Laterality Date   COLONOSCOPY WITH PROPOFOL N/A 02/11/2018   Procedure: COLONOSCOPY WITH PROPOFOL;  Surgeon: Lin Landsman, MD;  Location: [facility] ENDOSCOPY;  Service: Gastroenterology;  Laterality: N/A;   NO PAST SURGERIES     History   Social History   Marital Status: Single    Spouse Name: N/A    Number of Children: 0   Years of Education: 14   Occupational History   Take Out Specialist     [place]   Social History Main Topics   Smoking status: Current Every Day Smoker -- 0.50 packs/day for 25 years    Types: Cigarettes   Smokeless tobacco: Never Used   Alcohol Use: 1.8 oz/week    3 Cans of beer per week   Drug Use: No   Sexual Activity: Yes    Birth Control/ Protection: Condom   Social History Narrative   Mary Haney was born in [place], [place]. She moved with her family to [place] at age 37. She attended 2 years at [employer] and was majoring in [place]. Her goal is to become a Optometrist in the [employer]. 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 Mary Haney lives  with her boyfriend, [name]. They are considering getting married soon. She enjoys sketching outdoor scenery, cartoons and loves to read.   Current Outpatient Medications on File Prior to Visit  Medication Sig Dispense Refill   albuterol (PROAIR HFA) 108 (90 Base) MCG/ACT inhaler Inhale 2 puffs into the lungs every 6 (six) hours as needed for wheezing or shortness of breath. 1 Inhaler 1   cetirizine (ZYRTEC) 10 MG tablet Take 10 mg by mouth daily as needed for allergies.      Continuous Blood Gluc Receiver (FREESTYLE LIBRE 2 READER) DEVI For continuous glucose monitoring. 1 each 0   Continuous Blood Gluc Sensor (FREESTYLE LIBRE 2 SENSOR) MISC Apply new sensor every 14 days  6 each 2   Glucagon 3 MG/DOSE POWD Place 3 mg into the nose once as needed for up to 1 dose. 1 each 11   glucose blood (FREESTYLE PRECISION NEO TEST) test strip Use as instructed 4-6x a day 100 each 11   hydrocortisone (ANUSOL-HC) 25 MG suppository Place 1 suppository (25 mg total) rectally 2 (two) times daily. 12 suppository 0   insulin glargine (LANTUS) 100 UNIT/ML injection INJECT 13 UNITS INTO THE SKIN AS ADVISED 30 mL 3   Insulin Pen Needle (B-D UF III MINI PEN NEEDLES) 31G X 5 MM MISC USE 5x WHEN TAKING INSULIN. 300 each 3   Insulin Syringe-Needle U-100 (BD INSULIN SYRINGE U/F) 30G X 1/2" 0.5 ML MISC USE 2x a day WHEN TAKING INSULIN 200 each 3   NOVOLOG FLEXPEN 100 UNIT/ML FlexPen INJECT 2-9 UNITS INTO THE SKIN 3 (THREE) TIMES DAILY WITH MEALS. 15 mL 3   triamcinolone ointment (KENALOG) 0.1 % Apply 1 application topically 2 (two) times daily as needed. Limit use due risk of skin discoloration. 453.6 g 1   No current facility-administered medications on file prior to visit.   No Known Allergies Family History  Problem Relation Age of Onset   Diabetes Father    Depression Paternal Grandmother    Depression Paternal Grandfather    Depression Cousin    Breast cancer Neg Hx    Arthritis/Rheumatoid Neg Hx     PE: There were no vitals taken for this visit. Wt Readings from Last 3 Encounters:  03/18/21 124 lb 12.8 oz (56.6 kg)  02/08/21 130 lb 3.2 oz (59.1 kg)  12/17/20 130 lb 3.2 oz (59.1 kg)   Constitutional: normal weight, in NAD Eyes: PERRLA, EOMI, no exophthalmos ENT: moist mucous membranes, + slight, symmetric thyromegaly, no cervical lymphadenopathy Cardiovascular: RRR, No MRG Respiratory: CTA B Gastrointestinal: abdomen soft, NT, ND, BS+ Musculoskeletal: no deformities, strength intact in all 4 Skin: moist, warm, no rashes Neurological: no tremor with outstretched hands, DTR normal in all 4  ASSESSMENT: 1. DM, insulin-dependent, uncontrolled, with complications - hyper- and hypo-glycemia - History of DKA  Component     Latest Ref Rng & Units 11/04/2016  Hemoglobin A1C      9.0  C-Peptide     0.80 - 3.85 ng/mL <0.10 (L)  Glucose, Fasting     65 - 99 mg/dL 40 (L)  Glutamic Acid Decarb Ab     <5 IU/mL >250 (H)  Pancreatic Islet Cell Antibody     <5 JDF Units <5   Glucose very low at the time of the blood draw, therefore, C-peptide is not interpretable. However, GAD antibodies are undetectably high, confirming type 1 diabetes.  2.  Palpable thyroid  PLAN:  1. Patient with history of uncontrolled type 1 diabetes, on basal/bolus insulin regimen, with increased insulin sensitivity, managed with low insulin doses.  Her diabetes control is hindered by the fact that she is very busy at work and does not have time allotted to eat.  However, her sugars started to improve after she started on the CGM.  Her HbA1c at last visit was the lowest in a long time, at 6.5%!  At that time, she was having lows throughout the day and she had a large hyperglycemic peak approximately around 11 AM.  Upon questioning, she was having a smooth the morning which usually covered with insulin and then she had peanut butter crackers with grapes around 10:50 AM, as a snack, which she was not covering with  insulin.  She then bolused for lunch and her blood sugars plummeted before dinnertime.  She was using a stricter insulin to carb ratio of the recommended for the ease of calculation.  I strongly advised her to relax this to 1: 12 with starches and 1: 15 otherwise.  I gave her examples of different meals and how to use the insulin to carb ratios for these.  She was drinking orange juice to correct low blood sugars and we discussed about not over correcting these values.  This is probably the reason why her blood sugars were increasing around 1 AM.  To reduce her lows, I reduced her NovoLog doses. I strongly advised her to bolus for her snacks and to eliminate her grapes.  I also recommended to consider changing from the smoothie to a solid meal to improve her glycemia after breakfast.  I again advised her to check with her insurance whether an insulin pump would be covered. -At this visit, she tells me that she did stop her morning grape snack and sugars improved significantly afterwards. CGM interpretation: -At today's visit, we reviewed her CGM downloads: It appears that 65% of values are in target range (goal >70%), while 21% are higher than 180 (goal <25%), and 14% are lower than 70 (goal <4%) (improved from 25% at last visit).  The calculated average blood sugar is 138.  The projected HbA1c for the next 3 months (GMI) is 6.6.%. -Reviewing the CGM trends, sugars are still variable but they mostly buried within the normal range.  She has much less lows compared to before and no frequent lows at night.  She does have a hyperglycemic peak between 7 and 8:30 AM and upon questioning, she is having coffee then.  She is not bolusing for coffee and I advised her to try to bolus 1 unit. Sugars during the-the day are variable and occasionally low and upon questioning she is using again a slightly stricter insulin to carb ratio than recommended at last visit.  Also, she does not use a different ICR for starches and for  meals that do not contain too much starch and I advised her to try to do this.  This may improve her blood sugar variability.  For now, I advised her to decrease the Default ICR to 1: 15 and to use 1: 13 for starches. -She had an increase blood sugars peak around 4:57 PM and it is unclear whether this is related to a snack or lunch.  I advised her that if this is related to a snack, she can try to remain active, trying to change it to a low-carb snack or bolus for it.  If this is related to lunch, she needs to take more insulin for lunch. -the Lantus dose appears to be adequate so we will continue this -  I suggested to:  Patient Instructions  Please continue: - Lantus 5 units in a.m. and 5 units at bedtime  Change: - Novolog - ICR 1:13 for starches (including crackers) but use:    1:15 otherwise  Try to bolus 15 to 30 minutes before each meal.  Try to bolus 1 unit for coffee.  Try to see why the sugars are increasing around 3-5 pm.  Please come back for a follow-up appointment in 3-4 months.  - we checked her HbA1c: 6.1% (much better) - advised to check sugars at different times of the day - 4x a day, rotating check times - advised for yearly eye exams >> she  is not UTD - return to clinic in 3-4 months  2.  Palpable thyroid -Her thyroid feels symmetric and nonnodular on palpation -She denies neck compression symptoms -Latest TSH was normal: Lab Results  Component Value Date   TSH 0.75 02/25/2021  -We will continue to follow her clinically   Philemon Kingdom, MD PhD [facility]

## 2021-07-29 NOTE — Patient Instructions (Addendum)
Please continue: - Lantus 5 units in a.m. and 5 units at bedtime  Change: - Novolog - ICR 1:13 for starches (including crackers) but use:    1:15 otherwise  Try to bolus 15 to 30 minutes before each meal.  Try to bolus 1 unit for coffee.  Try to see why the sugars are increasing around 3-5 pm.  Please come back for a follow-up appointment in 3-4 months.

## 2021-09-08 ENCOUNTER — Other Ambulatory Visit: Payer: Self-pay | Admitting: Family

## 2021-09-08 DIAGNOSIS — L309 Dermatitis, unspecified: Secondary | ICD-10-CM

## 2021-10-06 ENCOUNTER — Other Ambulatory Visit: Payer: Self-pay | Admitting: Internal Medicine

## 2021-11-18 ENCOUNTER — Ambulatory Visit: Payer: 59 | Admitting: Internal Medicine

## 2021-11-18 NOTE — Progress Notes (Deleted)
Patient ID: Mary Haney, female   DOB: 28-Dec-1970, 50 y.o.   MRN: [id number]  This visit occurred during the SARS-CoV-2 public health emergency.  Safety protocols were in place, including screening questions prior to the visit, additional usage of staff PPE, and extensive cleaning of exam room while observing appropriate contact time as indicated for disinfecting solutions.   HPI: Mary Haney is a 50 y.o.-year-old female, returning for f/u for DM, dx 2013, insulin-dependent, uncontrolled, without long term complications. Last visit was 4 months ago. PCP: Dr. Caryl Bis  Interim history: She continues to be very active at work.  No increased urination, blurry vision, nausea, chest pain.  Reviewed history: In 07/2012 she went to the ED because she was not feeling well. She was found to have a blood glucose level of 600 + and was admitted with DKA. She was followed by [name] immediately following initial diagnosis, then switched to [facility] endocrinology.   In 10/2013, due to lack of finances, she was stretching the insulin doses >> sugars high >> low CBG 25-26 >> ED.    She was admitted in 04/2016 for DKA.  At the end of 2017, she met the DM educator and nutritionist >> decided for Omnipod pump >> but nobody called her back and then she could not afford it anymore.  She decided against that insulin pump, afterwards, but she now has a CGM.  Reviewed HbA1c levels: Lab Results  Component Value Date   HGBA1C 6.1 (A) 07/29/2021   HGBA1C 6.5 (A) 03/18/2021   HGBA1C 7.1 (A) 12/17/2020   HGBA1C 7.9 (H) 07/25/2020   HGBA1C 8.8 (A) 04/02/2020   HGBA1C 8.7 (A) 12/27/2019   HGBA1C 6.9 (A) 09/20/2019   HGBA1C 8.5 (A) 06/14/2019   HGBA1C 9.1 (A) 02/15/2019   HGBA1C 7.4 (A) 06/22/2018   HGBA1C 7.7 03/18/2018   HGBA1C 7.9 12/17/2017   HGBA1C 7.9 09/15/2017   HGBA1C 8.1 04/02/2017   HGBA1C 9.0 11/04/2016   HGBA1C 10.8 (H) 08/07/2016   HGBA1C 10.3 (H) 05/07/2016   HGBA1C 7.8 (H)  04/03/2015   HGBA1C 9.0 (H) 07/20/2014   HGBA1C 8.5 (H) 01/04/2014   Pt is on a regimen of: - Lantus 5 units in a.m. and 5 units at bedtime - Novolog - ICR 1:13 for starches (including crackers) but use:    1:15 otherwise  She checks her sugars more than 4 times a day with her freestyle libre CGM:  Previously:   Initially:   Lowest sugar was  37 (very busy, forgot to eat) ... >> 49 >> 40s; she has hypoglycemia awareness in the 60s. Highest sugar was 350 >> 400 >> 280.  Has ReliOn meter.  Pt's meals are: - coffee + cream + stevia right after she wakes up at 6:45 - Breakfast: oatmeal, yoghurt, sausage bisquit - Lunch: salad, soup, sandwiches - Dinner: meat + starch + vegetables - Snacks: yoghurt, peanut crackers, nuts  She is very busy at work and does not have time allotted for lunch. She continues to grab lunch to go. She cannot not inject insulin 15 minutes before lunch many times.  -No CKD: Lab Results  Component Value Date   BUN 7 02/25/2021   Lab Results  Component Value Date   CREATININE 0.70 02/25/2021   -She has a history of dyslipidemia: Lab Results  Component Value Date   CHOL 138 02/25/2021   HDL 60 02/25/2021   LDLCALC 39 02/25/2021   LDLDIRECT 59.0 06/14/2019   TRIG 372 (H) 02/25/2021  CHOLHDL 2.3 02/25/2021   - last eye exam was in 06/2020: No DR  - no numbness and tingling in her feet  Latest TSH was normal: Lab Results  Component Value Date   TSH 0.75 02/25/2021   Her thyroid remains palpable.  Pt denies: - feeling nodules in neck - hoarseness - dysphagia - choking - SOB with lying down  ROS: + see HPI  I reviewed pt's medications, allergies, PMH, social hx, family hx, and changes were documented in the history of present illness. Otherwise, unchanged from my initial visit note.  Past Medical History:  Diagnosis Date   Alcoholic pancreatitis    Asthma    Has albuterol inhaler   Diabetes mellitus without complication ([place])     Type 2 diagnosed 2013   Past Surgical History:  Procedure Laterality Date   COLONOSCOPY WITH PROPOFOL N/A 02/11/2018   Procedure: COLONOSCOPY WITH PROPOFOL;  Surgeon: Lin Landsman, MD;  Location: [facility] ENDOSCOPY;  Service: Gastroenterology;  Laterality: N/A;   NO PAST SURGERIES     History   Social History   Marital Status: Single    Spouse Name: N/A    Number of Children: 0   Years of Education: 14   Occupational History   Take Out Specialist     [employer]   Social History Main Topics   Smoking status: Current Every Day Smoker -- 0.50 packs/day for 25 years    Types: Cigarettes   Smokeless tobacco: Never Used   Alcohol Use: 1.8 oz/week    3 Cans of beer per week   Drug Use: No   Sexual Activity: Yes    Birth Control/ Protection: Condom   Social History Narrative   Mary Haney was born in [place], [place]. She moved with her family to [place] at age 37. She attended 2 years at [employer] and was majoring in [place]. Her goal is to become a Optometrist in the [employer]. 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 Natallie lives with her boyfriend, [name]. They are considering getting married soon. She enjoys sketching outdoor scenery, cartoons and loves to read.   Current Outpatient Medications on File Prior to Visit  Medication Sig Dispense Refill   albuterol (PROAIR HFA) 108 (90 Base) MCG/ACT inhaler Inhale 2 puffs into the lungs every 6 (six) hours as needed for wheezing or shortness of breath. 1 Inhaler 1   cetirizine (ZYRTEC) 10 MG tablet Take 10 mg by mouth daily as needed for allergies.      Continuous Blood Gluc Receiver (FREESTYLE LIBRE 2 READER) DEVI For continuous glucose monitoring. 1 each 0   Continuous Blood Gluc Sensor (FREESTYLE LIBRE 2 SENSOR) MISC APPLY NEW SENSOR EVERY 14 DAYS 6 each 2   Glucagon 3 MG/DOSE POWD Place 3 mg into the nose once as needed for up to 1 dose. 1 each 11   glucose blood (FREESTYLE PRECISION NEO  TEST) test strip Use as instructed 4-6x a day 100 each 11   hydrocortisone (ANUSOL-HC) 25 MG suppository Place 1 suppository (25 mg total) rectally 2 (two) times daily. 12 suppository 0   insulin glargine (LANTUS) 100 UNIT/ML injection INJECT 13 UNITS INTO THE SKIN AS ADVISED 30 mL 3   Insulin Pen Needle (B-D UF III MINI PEN NEEDLES) 31G X 5 MM MISC USE 5x WHEN TAKING INSULIN. 300 each 3   Insulin Syringe-Needle U-100 (BD INSULIN SYRINGE U/F) 30G X 1/2" 0.5 ML MISC USE 2x a day WHEN  TAKING INSULIN 200 each 3   NOVOLOG FLEXPEN 100 UNIT/ML FlexPen INJECT 2-9 UNITS INTO THE SKIN 3 (THREE) TIMES DAILY WITH MEALS. 15 mL 3   triamcinolone ointment (KENALOG) 0.1 % APPLY 1 APPLICATION TOPICALLY 2 (TWO) TIMES DAILY AS NEEDED. LIMIT USE DUE RISK OF SKIN DISCOLORATION. 454 g 1   No current facility-administered medications on file prior to visit.   No Known Allergies Family History  Problem Relation Age of Onset   Diabetes Father    Depression Paternal Grandmother    Depression Paternal Grandfather    Depression Cousin    Breast cancer Neg Hx    Arthritis/Rheumatoid Neg Hx    PE: There were no vitals taken for this visit. Wt Readings from Last 3 Encounters:  07/29/21 123 lb 9.6 oz (56.1 kg)  03/18/21 124 lb 12.8 oz (56.6 kg)  02/08/21 130 lb 3.2 oz (59.1 kg)   Constitutional: normal weight, in NAD Eyes: PERRLA, EOMI, no exophthalmos ENT: moist mucous membranes, + slight, symmetric thyromegaly, no cervical lymphadenopathy Cardiovascular: RRR, No MRG Respiratory: CTA B Musculoskeletal: no deformities, strength intact in all 4 Skin: moist, warm, no rashes Neurological: no tremor with outstretched hands, DTR normal in all 4  ASSESSMENT: 1. DM, insulin-dependent, uncontrolled, with complications - hyper- and hypo-glycemia - History of DKA  Component     Latest Ref Rng & Units 11/04/2016  Hemoglobin A1C      9.0  C-Peptide     0.80 - 3.85 ng/mL <0.10 (L)  Glucose, Fasting     65 - 99  mg/dL 40 (L)  Glutamic Acid Decarb Ab     <5 IU/mL >250 (H)  Pancreatic Islet Cell Antibody     <5 JDF Units <5   Glucose very low at the time of the blood draw, therefore, C-peptide is not interpretable. However, GAD antibodies are undetectably high, confirming type 1 diabetes.  2.  Palpable thyroid  PLAN:  1. Patient with history of uncontrolled type 1 diabetes, on basal-bolus insulin regimen, with increased insulin sensitivity, managed with low insulin doses.  Her diabetes control is hindered by the fact that she is very busy at work and does not have time allotted to eat or bolus for her meals.  Sugars started to improve after she started a CGM.  HbA1c at last visit decreased even more, at 6.1%.  Over the last several visits, we also continue to adjust her diet to avoid postprandial hyperglycemia.  At last visit, sugars are still variable throughout the day but mostly varying within the normal range.  She had much less lows compared to before and now frequent lows at night.  She did have a hyperglycemic peak between 7 and 8:30 in the morning and upon questioning, she was coffee drinking coffee then.  I advised her to try to bolus 1 unit at least for coffee.  Sugars during the day were variable and they were occasionally lower as she was using a stricter insulin to carb ratio than recommended at the previous visit.  We will asked the ICR with regular meals but we kept a more strict ICR for starches.  She was having higher blood sugars in the late afternoon and I advised her to try to see why this is happening as I was not able to identify potential culprits during last visit.  I did advise her that if this was related to a snack, to change to a low carb snack or to bolus for it. CGM interpretation: -At today's  visit, we reviewed her CGM downloads: It appears that *** of values are in target range (goal >70%), while *** are higher than 180 (goal <25%), and *** are lower than 70 (goal <4%).  The  calculated average blood sugar is ***.  The projected HbA1c for the next 3 months (GMI) is ***. -Reviewing the CGM trends, ***  -  I suggested to:  Patient Instructions  Please continue: - Lantus 5 units in a.m. and 5 units at bedtime - Novolog - ICR 1:13 for starches (including crackers) but use:    1:15 otherwise  Try to bolus 15 to 30 minutes before each meal.  Try to bolus 1 unit for coffee.  Try to see why the sugars are increasing around 3-5 pm.  Please come back for a follow-up appointment in 3-4 months.  - we checked her HbA1c: 7%  - advised to check sugars at different times of the day - 4x a day, rotating check times - advised for yearly eye exams >> she is not UTD - return to clinic in 3-4 months  2.  Palpable thyroid -Thyroid feels symmetric and nonnodular on palpation -No neck compression symptoms -Latest TSH was normal: Lab Results  Component Value Date   TSH 0.75 02/25/2021  -We are following her clinically  Philemon Kingdom, MD PhD [facility] Endocrinology

## 2021-12-16 ENCOUNTER — Encounter: Payer: Self-pay | Admitting: Internal Medicine

## 2021-12-16 ENCOUNTER — Other Ambulatory Visit: Payer: Self-pay

## 2021-12-16 ENCOUNTER — Ambulatory Visit (INDEPENDENT_AMBULATORY_CARE_PROVIDER_SITE_OTHER): Payer: 59 | Admitting: Internal Medicine

## 2021-12-16 VITALS — BP 138/88 | HR 99 | Ht 62.95 in | Wt 120.4 lb

## 2021-12-16 DIAGNOSIS — E1065 Type 1 diabetes mellitus with hyperglycemia: Secondary | ICD-10-CM

## 2021-12-16 DIAGNOSIS — E0789 Other specified disorders of thyroid: Secondary | ICD-10-CM

## 2021-12-16 LAB — POCT GLYCOSYLATED HEMOGLOBIN (HGB A1C): Hemoglobin A1C: 6.3 % — AB (ref 4.0–5.6)

## 2021-12-16 MED ORDER — INSULIN GLARGINE 100 UNIT/ML ~~LOC~~ SOLN: SUBCUTANEOUS | 3 refills | Status: DC

## 2021-12-16 NOTE — Progress Notes (Signed)
Patient ID: Mary Haney, female   DOB: Apr 28, 1971, 51 y.o.   MRN: [id number]  This visit occurred during the SARS-CoV-2 public health emergency.  Safety protocols were in place, including screening questions prior to the visit, additional usage of staff PPE, and extensive cleaning of exam room while observing appropriate contact time as indicated for disinfecting solutions.   HPI: Mary Haney is a 51 y.o.-year-old female, returning for f/u for DM, dx 2013, insulin-dependent, uncontrolled, without long term complications. Last visit was 4 months ago. PCP: Dr. Caryl Haney  Interim history: She continues to be very active at work.  No increased urination, blurry vision, nausea, chest pain.  Reviewed history: In 07/2012 she went to the ED because she was not feeling well. She was found to have a blood glucose level of 600 + and was admitted with DKA. She was followed by Dr. Gabriel Haney immediately following initial diagnosis, then switched to [facility].   In 10/2013, due to lack of finances, she was stretching the insulin doses >> sugars high >> low CBG 25-26 >> ED.    She was admitted in 04/2016 for DKA.  At the end of 2017, she met the DM educator and nutritionist >> decided for Omnipod pump >> but nobody called her back and then she could not afford it anymore.  She decided against that insulin pump, afterwards, but she now has a CGM.  Reviewed HbA1c levels: Lab Results  Component Value Date   HGBA1C 6.1 (A) 07/29/2021   HGBA1C 6.5 (A) 03/18/2021   HGBA1C 7.1 (A) 12/17/2020   HGBA1C 7.9 (H) 07/25/2020   HGBA1C 8.8 (A) 04/02/2020   HGBA1C 8.7 (A) 12/27/2019   HGBA1C 6.9 (A) 09/20/2019   HGBA1C 8.5 (A) 06/14/2019   HGBA1C 9.1 (A) 02/15/2019   HGBA1C 7.4 (A) 06/22/2018   HGBA1C 7.7 03/18/2018   HGBA1C 7.9 12/17/2017   HGBA1C 7.9 09/15/2017   HGBA1C 8.1 04/02/2017   HGBA1C 9.0 11/04/2016   HGBA1C 10.8 (H) 08/07/2016   HGBA1C 10.3 (H) 05/07/2016   HGBA1C 7.8 (H)  04/03/2015   HGBA1C 9.0 (H) 07/20/2014   HGBA1C 8.5 (H) 01/04/2014   Pt is on a regimen of: - Lantus 5 units in a.m. and 5 units at bedtime - Novolog - ICR 1:13 for starches (including crackers) but use:    1:15 otherwise  She checks her sugars more than 4 times a day with her freestyle libre CGM:   Previously:   Initially:   Lowest sugar was  37 (very busy, forgot to eat) ... >> 49 >> 40s >> 37!!!; she has hypoglycemia awareness in the 60s. Highest sugar was 350 >> 400 >> 280 >> 300s.  Has ReliOn meter.  Pt's meals are: - coffee + cream + stevia right after she wakes up at 6:45 - Breakfast: oatmeal, yoghurt, sausage bisquit - grapes! - Lunch: salad, soup, sandwiches - Dinner: meat + starch + vegetables - Snacks: yoghurt, peanut crackers, nuts  She is very busy at work and does not have time allotted for lunch. She continues to grab lunch to go. She cannot not inject insulin 15 minutes before lunch many times.  -No CKD: Lab Results  Component Value Date   BUN 7 02/25/2021   Lab Results  Component Value Date   CREATININE 0.70 02/25/2021   -She has a history of dyslipidemia: Lab Results  Component Value Date   CHOL 138 02/25/2021   HDL 60 02/25/2021   LDLCALC 39 02/25/2021   LDLDIRECT 59.0 06/14/2019  TRIG 372 (H) 02/25/2021   CHOLHDL 2.3 02/25/2021   - last eye exam was in 06/2020: No DR  - no numbness and tingling in her feet  Latest TSH was normal: Lab Results  Component Value Date   TSH 0.75 02/25/2021   Her thyroid remains palpable.  Pt denies: - feeling nodules in neck - hoarseness - dysphagia - choking - SOB with lying down  ROS: + see HPI  I reviewed pt's medications, allergies, PMH, social hx, family hx, and changes were documented in the history of present illness. Otherwise, unchanged from my initial visit note.  Past Medical History:  Diagnosis Date   Alcoholic pancreatitis    Asthma    Has albuterol inhaler   Diabetes  mellitus without complication ([facility])    Type 2 diagnosed 2013   Past Surgical History:  Procedure Laterality Date   COLONOSCOPY WITH PROPOFOL N/A 02/11/2018   Procedure: COLONOSCOPY WITH PROPOFOL;  Surgeon: Mary Landsman, MD;  Location: [facility] ENDOSCOPY;  Service: Gastroenterology;  Laterality: N/A;   NO PAST SURGERIES     History   Social History   Marital Status: Single    Spouse Name: N/A    Number of Children: 0   Years of Education: 14   Occupational History   Take Out Specialist     Mary Haney   Social History Main Topics   Smoking status: Current Every Day Smoker -- 0.50 packs/day for 25 years    Types: Cigarettes   Smokeless tobacco: Never Used   Alcohol Use: 1.8 oz/week    3 Cans of beer per week   Drug Use: No   Sexual Activity: Yes    Birth Control/ Protection: Condom   Social History Narrative   Mary Haney was born in [place], [place]. She moved with her family to [place] at age 51. She attended 2 years at [employer] and was majoring in [place]. Her goal is to become a Optometrist in the [employer]. 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 Mary Haney lives with her boyfriend, Mary Haney. They are considering getting married soon. She enjoys sketching outdoor scenery, cartoons and loves to read.   Current Outpatient Medications on File Prior to Visit  Medication Sig Dispense Refill   albuterol (PROAIR HFA) 108 (90 Base) MCG/ACT inhaler Inhale 2 puffs into the lungs every 6 (six) hours as needed for wheezing or shortness of breath. 1 Inhaler 1   cetirizine (ZYRTEC) 10 MG tablet Take 10 mg by mouth daily as needed for allergies.      Continuous Blood Gluc Receiver (FREESTYLE LIBRE 2 READER) DEVI For continuous glucose monitoring. 1 each 0   Continuous Blood Gluc Sensor (FREESTYLE LIBRE 2 SENSOR) MISC APPLY NEW SENSOR EVERY 14 DAYS 6 each 2   Glucagon 3 MG/DOSE POWD Place 3 mg into the nose once as needed for up to 1 dose. 1 each 11    glucose blood (FREESTYLE PRECISION NEO TEST) test strip Use as instructed 4-6x a day 100 each 11   hydrocortisone (ANUSOL-HC) 25 MG suppository Place 1 suppository (25 mg total) rectally 2 (two) times daily. 12 suppository 0   insulin glargine (LANTUS) 100 UNIT/ML injection INJECT 13 UNITS INTO THE SKIN AS ADVISED 30 mL 3   Insulin Pen Needle (B-D UF III MINI PEN NEEDLES) 31G X 5 MM MISC USE 5x WHEN TAKING INSULIN. 300 each 3   Insulin Syringe-Needle U-100 (BD INSULIN SYRINGE U/F) 30G X 1/2" 0.5 ML  MISC USE 2x a day WHEN TAKING INSULIN 200 each 3   NOVOLOG FLEXPEN 100 UNIT/ML FlexPen INJECT 2-9 UNITS INTO THE SKIN 3 (THREE) TIMES DAILY WITH MEALS. 15 mL 3   triamcinolone ointment (KENALOG) 0.1 % APPLY 1 APPLICATION TOPICALLY 2 (TWO) TIMES DAILY AS NEEDED. LIMIT USE DUE RISK OF SKIN DISCOLORATION. 454 g 1   No current facility-administered medications on file prior to visit.   No Known Allergies Family History  Problem Relation Age of Onset   Diabetes Father    Depression Paternal Grandmother    Depression Paternal Grandfather    Depression Cousin    Breast cancer Neg Hx    Arthritis/Rheumatoid Neg Hx    PE: BP 138/88 (BP Location: Left Arm, Patient Position: Sitting, Cuff Size: Normal)    Pulse 99    Ht 5' 2.95" (1.599 m)    Wt 120 lb 6.4 oz (54.6 kg)    SpO2 98%    BMI 21.36 kg/m  Wt Readings from Last 3 Encounters:  12/16/21 120 lb 6.4 oz (54.6 kg)  07/29/21 123 lb 9.6 oz (56.1 kg)  03/18/21 124 lb 12.8 oz (56.6 kg)   Constitutional: normal weight, in NAD Eyes: PERRLA, EOMI, no exophthalmos ENT: moist mucous membranes, + slight, symmetric thyromegaly, no cervical lymphadenopathy Cardiovascular: Tachycardia, RR, No MRG Respiratory: CTA B Musculoskeletal: no deformities, strength intact in all 4 Skin: moist, warm, no rashes Neurological: no tremor with outstretched hands, DTR normal in all 4  ASSESSMENT: 1. DM, insulin-dependent, uncontrolled, with complications - hyper- and  hypo-glycemia - History of DKA  Component     Latest Ref Rng & Units 11/04/2016  Hemoglobin A1C      9.0  C-Peptide     0.80 - 3.85 ng/mL <0.10 (L)  Glucose, Fasting     65 - 99 mg/dL 40 (L)  Glutamic Acid Decarb Ab     <5 IU/mL >250 (H)  Pancreatic Islet Cell Antibody     <5 JDF Units <5   Glucose very low at the time of the blood draw, therefore, C-peptide is not interpretable. However, GAD antibodies are undetectably high, confirming type 1 diabetes.  2.  Palpable thyroid  PLAN:  1. Patient with history of uncontrolled type 1 diabetes, on basal-bolus insulin regimen, with increased insulin sensitivity, managed with low insulin doses.  Her diabetes started to improve after she started a CGM.  At last visit, HbA1c was excellent, at 6.1%.  At that time, we changed her insulin to carb ratios, advised her to bolus 1 unit of insulin for coffee, and also advised her to check why the blood sugars were increasing around 5 PM. CGM interpretation: -At today's visit, we reviewed her CGM downloads: It appears that 56% of values are in target range (goal >70%), while 34% are higher than 180 (goal <25%), and 10% are lower than 70 (goal <4%).  The calculated average blood sugar is 156.  The projected HbA1c for the next 3 months (GMI) is 7.0%. -Reviewing the CGM trends, sugars appear to be higher and more variable during the night, they then decrease before lunch and they increase abruptly afterwards. Upon questioning, she is again eating grapes as a snack before lunch when she is too busy to eat lunch.  Sugars increased after Graves' and remained elevated after lunch.  Therefore, she corrects these and drops her sugars too low before dinner.  Because of the lower blood sugars, she cannot bolus the entire NovoLog dose for dinner (she mostly  boluses 2 units) and sugars increase subsequently afterwards.  She then tries to correct the high blood sugars after dinner and sugars become very variable at night.  I  demonstrated to her that eating few grapes in the middle of the day can influence all of her subsequent blood sugars and her next morning.  I again strongly advised her to stop grapes as snacks.  I advised him that if she eats few grapes at the end of a meal, the sugars usually do not increase as significantly. -For now, we will not make any other changes but I did advise her that she may need more insulin with dinner in the absence of hypoglycemia before this meal.  Her Lantus dose appears to be adequate. -  I suggested to:  Patient Instructions  Please continue: - Lantus 5 units in a.m. and 5 units at bedtime - Novolog - ICR 1:13 for starches (including crackers) but use:    1:15 otherwise  Try to bolus 15 to 30 minutes before each meal.  Try to bolus 1 unit for coffee.  STOP GRAPES AS SNACKS.  Please come back for a follow-up appointment in 3-4 months.  - we checked her HbA1c: 6.3% (slightly higher) - advised to check sugars at different times of the day - 4x a day, rotating check times - advised for yearly eye exams >> she is UTD - return to clinic in 3-4 months  2.  Palpable thyroid -Thyroid feels symmetric and nonnodular on palpation -No neck compression symptoms -Latest TSH was normal: Lab Results  Component Value Date   TSH 0.75 02/25/2021  -We are following her clinically  Philemon Kingdom, MD PhD [facility] Endocrinology

## 2021-12-16 NOTE — Patient Instructions (Addendum)
Please continue: - Lantus 5 units in a.m. and 5 units at bedtime - Novolog - ICR 1:13 for starches (including crackers) but use:    1:15 otherwise  Try to bolus 15 to 30 minutes before each meal.  Try to bolus 1 unit for coffee.  STOP GRAPES AS SNACKS.  Please come back for a follow-up appointment in 3-4 months.

## 2021-12-18 ENCOUNTER — Other Ambulatory Visit: Payer: Self-pay | Admitting: Internal Medicine

## 2021-12-18 ENCOUNTER — Other Ambulatory Visit (HOSPITAL_COMMUNITY): Payer: Self-pay

## 2021-12-18 NOTE — Telephone Encounter (Signed)
T, please forward appropriately.  She needs a PA for this.

## 2021-12-20 ENCOUNTER — Encounter: Payer: Self-pay | Admitting: Internal Medicine

## 2021-12-20 ENCOUNTER — Telehealth: Payer: Self-pay | Admitting: Internal Medicine

## 2021-12-20 NOTE — Telephone Encounter (Signed)
Request addressed in MyChart message from pt.

## 2021-12-20 NOTE — Telephone Encounter (Signed)
Patient called to advise that she has changed insurance and that her Lantus & Novolog are not covered.  Patient asking if there is something else she should take or if a PA is needed.  Call back # [phone]

## 2021-12-25 ENCOUNTER — Telehealth: Payer: Self-pay | Admitting: Internal Medicine

## 2021-12-25 DIAGNOSIS — E1065 Type 1 diabetes mellitus with hyperglycemia: Secondary | ICD-10-CM

## 2021-12-25 NOTE — Telephone Encounter (Signed)
Patient called stating that her insurance will not cover Lantus or the [place] [employer].   Insurance instructed that coverage can be provided for Humulog or Dexcom.  Please provide new prescription on the above and forward to the pharmacy. [id number] [address], [place] Phone:  [phone]  Fax:  [phone]    Please provide confirmation to patient via My Chart.

## 2021-12-26 ENCOUNTER — Other Ambulatory Visit: Payer: Self-pay | Admitting: Internal Medicine

## 2021-12-26 DIAGNOSIS — E1065 Type 1 diabetes mellitus with hyperglycemia: Secondary | ICD-10-CM

## 2021-12-26 MED ORDER — DEXCOM G6 SENSOR MISC: 3 refills | Status: DC

## 2021-12-26 MED ORDER — DEXCOM G6 TRANSMITTER MISC: 3 refills | Status: DC

## 2021-12-26 MED ORDER — BASAGLAR KWIKPEN 100 UNIT/ML ~~LOC~~ SOPN: PEN_INJECTOR | SUBCUTANEOUS | 0 refills | Status: DC

## 2021-12-26 MED ORDER — DEXCOM G6 RECEIVER DEVI: 0 refills | Status: DC

## 2021-12-26 NOTE — Telephone Encounter (Signed)
RX have been changed and sent to preferred pharmacy.

## 2021-12-27 ENCOUNTER — Telehealth: Payer: Self-pay

## 2021-12-27 ENCOUNTER — Other Ambulatory Visit: Payer: Self-pay | Admitting: Internal Medicine

## 2021-12-27 ENCOUNTER — Other Ambulatory Visit (HOSPITAL_COMMUNITY): Payer: Self-pay

## 2021-12-27 MED ORDER — LEVEMIR FLEXTOUCH 100 UNIT/ML ~~LOC~~ SOPN: 10.0000 [IU] | PEN_INJECTOR | Freq: Every day | SUBCUTANEOUS | 11 refills | Status: DC

## 2021-12-27 NOTE — Telephone Encounter (Signed)
Dexcom needs PA

## 2021-12-27 NOTE — Telephone Encounter (Signed)
Patient Advocate Encounter  Prior Authorization for [employer] has been approved.    PA# [id number] Effective dates: 12/27/21 through 12/27/22  Per Test Claim Patients co-pay is $120/90 DS    Dexcom G6 Receiver has been approved.  PA# [id number] Effective dates: 12/27/21 through 12/27/22  Per Test Claim Patients co-pay is $120/90 DS    Dexcom G6 Sensors has been approved.  PA#  [id number] Effective dates: 12/27/21 through 12/27/22  Per Test Claim Patients co-pay is $40/30 DS    Spoke with Pharmacy to Process.  Patient Advocate Fax: [phone]

## 2021-12-27 NOTE — Telephone Encounter (Addendum)
Patient Advocate Encounter   Received notification from patient calls that prior authorization for Dexcom G6 Transmitter is required by his/her [employer].   PA submitted on 12/27/21  Key#: [id number]  Status is pending   Dexcom G6 Receiver  Key#: [id number]  Dexcom G6 Sensors  Key#: [facility] will continue to follow:  Patient Advocate Fax: [phone]

## 2021-12-31 ENCOUNTER — Other Ambulatory Visit (HOSPITAL_COMMUNITY): Payer: Self-pay

## 2021-12-31 NOTE — Telephone Encounter (Signed)
Patient Advocate Encounter  Prior Authorization for Dexcom has been approved by [employer]/ [employer].    PA# request ID [id number] (receiver), [phone] (sensors) Effective dates: 12/27/21 through 12/27/22  Per Test Claim Patients co-pay is $120 (transmitter), $40 (receiver). (Sensors filled 12/27/21, next fill date 01/17/22)

## 2022-01-02 ENCOUNTER — Telehealth: Payer: Self-pay | Admitting: Internal Medicine

## 2022-01-02 DIAGNOSIS — E1065 Type 1 diabetes mellitus with hyperglycemia: Secondary | ICD-10-CM

## 2022-01-02 NOTE — Telephone Encounter (Signed)
Yes, please! Ty! C

## 2022-01-02 NOTE — Telephone Encounter (Signed)
Pt is calling in stating that she has contacted the office to let us know that she has new insurance and the current is not being FREESTYLE LIBRE 2 SENSOR and Lantus insulin and stated that what is covered by her insurance is Humalog and Dexcom G6.  Pharm:  [facility] [address] in [place]  Pt would like to have a call once it has been called in.

## 2022-01-03 MED ORDER — NOVOLOG FLEXPEN 100 UNIT/ML ~~LOC~~ SOPN: 2.0000 [IU] | PEN_INJECTOR | Freq: Three times a day (TID) | SUBCUTANEOUS | 3 refills | Status: DC

## 2022-01-03 MED ORDER — BASAGLAR KWIKPEN 100 UNIT/ML ~~LOC~~ SOPN: 10.0000 [IU] | PEN_INJECTOR | Freq: Every day | SUBCUTANEOUS | 2 refills | Status: DC

## 2022-01-03 NOTE — Telephone Encounter (Signed)
Called and spoke with pt to confirm what supplies she needed for her Dexcom system. Pt advised diabetic educator will contact her to schedule appt. Pt requesting an alternative long acting insulin Lantus is not covered and Levemir is too expensive out of pocket. Dr Cruzita Lederer confirmed Basaglar can be prescribed unit for unit as another option.  Novolog rx sent to pharmacy to see if it is currently covered by new insurance.

## 2022-01-03 NOTE — Telephone Encounter (Signed)
Pt is calling in stating that she is needing the whole kit for DEXCOM G6  and would like to know how to work it and to know if she is going to be needing any of the supplies.  Pt would like to have a call back to [phone] (work) or [phone] (Cell). °

## 2022-01-04 ENCOUNTER — Other Ambulatory Visit: Payer: Self-pay | Admitting: Internal Medicine

## 2022-01-04 DIAGNOSIS — E1065 Type 1 diabetes mellitus with hyperglycemia: Secondary | ICD-10-CM

## 2022-01-07 ENCOUNTER — Telehealth: Payer: Self-pay | Admitting: Family

## 2022-01-07 NOTE — Telephone Encounter (Signed)
Pt called in stating she switched from freestyle libre to dexcom and need someone to help her put it on. Pt would like to be called today

## 2022-01-08 ENCOUNTER — Telehealth: Payer: Self-pay | Admitting: Nutrition

## 2022-01-08 NOTE — Telephone Encounter (Signed)
I spoke with patient & she had contact endo who prescribed the Dexcom. They had not returned her call. I explained that I just was not familiar with the Dexcom system. I asked Catie or our clinical pharmacist & she suggested that Dexcom web site had great tutorial videos that may be helpful. Pt stated that she would try this & see of helpful.

## 2022-01-08 NOTE — Telephone Encounter (Signed)
LVM with number to call to schedule training

## 2022-03-28 ENCOUNTER — Other Ambulatory Visit: Payer: Self-pay | Admitting: Internal Medicine

## 2022-03-28 ENCOUNTER — Other Ambulatory Visit: Payer: Self-pay | Admitting: Family

## 2022-03-28 DIAGNOSIS — Z1231 Encounter for screening mammogram for malignant neoplasm of breast: Secondary | ICD-10-CM

## 2022-04-21 ENCOUNTER — Ambulatory Visit: Payer: 59 | Admitting: Internal Medicine

## 2022-04-21 ENCOUNTER — Encounter: Payer: Self-pay | Admitting: Internal Medicine

## 2022-04-21 VITALS — BP 120/82 | HR 78 | Ht 62.94 in | Wt 118.4 lb

## 2022-04-21 DIAGNOSIS — E0789 Other specified disorders of thyroid: Secondary | ICD-10-CM

## 2022-04-21 DIAGNOSIS — E1065 Type 1 diabetes mellitus with hyperglycemia: Secondary | ICD-10-CM

## 2022-04-21 LAB — POCT GLYCOSYLATED HEMOGLOBIN (HGB A1C): Hemoglobin A1C: 6.3 % — AB (ref 4.0–5.6)

## 2022-04-21 MED ORDER — FREESTYLE LIBRE 2 READER DEVI: 1.0000 | Freq: Every day | 0 refills | Status: DC

## 2022-04-21 MED ORDER — FREESTYLE LIBRE 2 SENSOR MISC: 1.0000 | 3 refills | Status: DC

## 2022-04-21 NOTE — Progress Notes (Signed)
Patient ID: Mary Haney, female   DOB: 29-May-1971, 51 y.o.   MRN: [id number] ? ?This visit occurred during the SARS-CoV-2 public health emergency.  Safety protocols were in place, including screening questions prior to the visit, additional usage of staff PPE, and extensive cleaning of exam room while observing appropriate contact time as indicated for disinfecting solutions.  ? ?HPI: ?Mary Haney is a 51 y.o.-year-old female, returning for f/u for DM, dx 2013, insulin-dependent, uncontrolled, without long term complications. Last visit was 4 months ago. ?PCP: Dr. Caryl Bis ? ?Interim history: ?She continues to be very active at work.  She had a very busy 2 weeks.  Sugars have been more fluctuating. ?No increased urination, blurry vision, nausea, chest pain. ? ?Reviewed history: ?In 07/2012 she went to the ED because she was not feeling well. She was found to have a blood glucose level of 600 + and was admitted with DKA. She was followed by [name] immediately following initial diagnosis, then switched to [facility] endocrinology. ? ? In 10/2013, due to lack of finances, she was stretching the insulin doses >> sugars high >> low CBG 25-26 >> ED.  ? ? She was admitted in 04/2016 for DKA. ? ?At the end of 2017, she met the DM educator and nutritionist >> decided for Omnipod pump >> but nobody called her back and then she could not afford it anymore. ? ?She decided against that insulin pump, afterwards, but she now has a CGM. ? ?Reviewed HbA1c levels: ?Lab Results  ?Component Value Date  ? HGBA1C 6.3 (A) 12/16/2021  ? HGBA1C 6.1 (A) 07/29/2021  ? HGBA1C 6.5 (A) 03/18/2021  ? HGBA1C 7.1 (A) 12/17/2020  ? HGBA1C 7.9 (H) 07/25/2020  ? HGBA1C 8.8 (A) 04/02/2020  ? HGBA1C 8.7 (A) 12/27/2019  ? HGBA1C 6.9 (A) 09/20/2019  ? HGBA1C 8.5 (A) 06/14/2019  ? HGBA1C 9.1 (A) 02/15/2019  ? HGBA1C 7.4 (A) 06/22/2018  ? HGBA1C 7.7 03/18/2018  ? HGBA1C 7.9 12/17/2017  ? HGBA1C 7.9 09/15/2017  ? HGBA1C 8.1 04/02/2017  ? HGBA1C 9.0  11/04/2016  ? HGBA1C 10.8 (H) 08/07/2016  ? HGBA1C 10.3 (H) 05/07/2016  ? HGBA1C 7.8 (H) 04/03/2015  ? HGBA1C 9.0 (H) 07/20/2014  ? ?Pt is on a regimen of: ?- Lantus >> Levemir 5 units in a.m. and 5 units at bedtime ?- Novolog - ICR 1:13 for starches (including crackers) but use: ?   1:15 otherwise -ends up with approximately 2 units per meal. ? ?She checks her sugars more than 4 times a day with her freestyle libre CGM: ? ? ?Previously: ? ? ?Previously: ? ? ?Initially: ? ? ?Lowest sugar was  37 (very busy, forgot to eat) ... >> 49 >> 40s >> 37!!! >> 40; she has hypoglycemia awareness in the 60s. ?Highest sugar was 350 >> 400 >> 280 >> 300s >> 200s. ? ?Has ReliOn meter. ? ?Pt's meals are: ?- coffee + cream + stevia right after she wakes up at 6:45 ?- Breakfast: oatmeal, yoghurt, sausage bisquit ?- grapes! ?- Lunch: salad, soup, sandwiches ?- Dinner: meat + starch + vegetables ?- Snacks: yoghurt, peanut crackers, nuts ? ?She is very busy at work and does not have time allotted for lunch. She continues to grab lunch to go. She cannot inject insulin 15 minutes before lunch many times. ? ?-No CKD: ?Lab Results  ?Component Value Date  ? BUN 7 02/25/2021  ? ?Lab Results  ?Component Value Date  ? CREATININE 0.70 02/25/2021  ? ?-She has a history  of dyslipidemia: ?Lab Results  ?Component Value Date  ? CHOL 138 02/25/2021  ? HDL 60 02/25/2021  ? [place] 39 02/25/2021  ? LDLDIRECT 59.0 06/14/2019  ? TRIG 372 (H) 02/25/2021  ? CHOLHDL 2.3 02/25/2021  ? ?- last eye exam was in 06/2020: No DR ? ?- no numbness and tingling in her feet ? ?Latest TSH was normal: ?Lab Results  ?Component Value Date  ? TSH 0.75 02/25/2021  ? ?Her thyroid remains palpable. ? ?Pt denies: ?- feeling nodules in neck ?- hoarseness ?- dysphagia ?- choking ?- SOB with lying down ? ?ROS: ?+ see HPI ? ?I reviewed pt's medications, allergies, PMH, social hx, family hx, and changes were documented in the history of present illness. Otherwise, unchanged from my  initial visit note. ? ?Past Medical History:  ?Diagnosis Date  ? Alcoholic pancreatitis   ? Asthma   ? Has albuterol inhaler  ? Diabetes mellitus without complication ([place])   ? Type 2 diagnosed 2013  ? ?Past Surgical History:  ?Procedure Laterality Date  ? COLONOSCOPY WITH PROPOFOL N/A 02/11/2018  ? Procedure: COLONOSCOPY WITH PROPOFOL;  Surgeon: Lin Landsman, MD;  Location[facility] ENDOSCOPY;  Service: Gastroenterology;  Laterality: N/A;  ? NO PAST SURGERIES    ? ?History  ? ?Social History  ? Marital Status: Single  ?  Spouse Name: N/A  ?  Number of Children: 0  ? Years of Education: 95  ? 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  [employer]  ? ?Social History Main Topics  ? Smoking status: Current Every Day Smoker -- 0.50 packs/day for 25 years  ?  Types: Cigarettes  ? Smokeless tobacco: Never Used  ? Alcohol Use: 1.8 oz/week  ?  3 Cans of beer per week  ? Drug Use: No  ? Sexual Activity: Yes  ?  Birth Control/ Protection: Condom  ? ?Social History Narrative  ? Mary Haney was born in [place], [place]. She moved with her family to [place] at age 89. She attended 2 years at [employer] and was majoring in [place]. Her goal is to become a Optometrist in the [employer]. 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 Mary Haney lives with her boyfriend, [name]. They are considering getting married soon. She enjoys sketching outdoor scenery, cartoons and loves to read.  ? ?Current Outpatient Medications on File Prior to Visit  ?Medication Sig Dispense Refill  ? albuterol (PROAIR HFA) 108 (90 Base) MCG/ACT inhaler Inhale 2 puffs into the lungs every 6 (six) hours as needed for wheezing or shortness of breath. 1 Inhaler 1  ? B-D ULTRAFINE III SHORT PEN 31G X 8 MM MISC USE FIVE TIMES DAILY 300 each 3  ? cetirizine (ZYRTEC) 10 MG tablet Take 10 mg by mouth daily as needed for allergies.     ? Continuous Blood Gluc Receiver (DEXCOM G6 RECEIVER) DEVI Use as instructed to check blood sugars  1 each 0  ? Continuous Blood Gluc Receiver (FREESTYLE LIBRE 2 READER) DEVI For continuous glucose monitoring. 1 each 0  ? Continuous Blood Gluc Sensor (DEXCOM G6 SENSOR) MISC Use as instructed to check blood sugars, change every 10 days 9 each 3  ? Continuous Blood Gluc Sensor (FREESTYLE LIBRE 2 SENSOR) MISC APPLY NEW SENSOR EVERY 14 DAYS 2 each 2  ? Continuous Blood Gluc Transmit (DEXCOM G6 TRANSMITTER) MISC Use as instructed to check blood sugars, replace every 3 months 1 each 3  ? Glucagon  3 MG/DOSE POWD Place 3 mg into the nose once as needed for up to 1 dose. 1 each 11  ? glucose blood (FREESTYLE PRECISION NEO TEST) test strip Use as instructed 4-6x a day 100 each 11  ? hydrocortisone (ANUSOL-HC) 25 MG suppository Place 1 suppository (25 mg total) rectally 2 (two) times daily. 12 suppository 0  ? insulin aspart (NOVOLOG FLEXPEN) 100 UNIT/ML FlexPen Inject 2-9 Units into the skin 3 (three) times daily with meals. 15 mL 3  ? insulin glargine-yfgn (SEMGLEE) 100 UNIT/ML Pen Inject 10-12 Units into the skin daily. 15 mL 2  ? Insulin Pen Needle (B-D UF III MINI PEN NEEDLES) 31G X 5 MM MISC USE 5x WHEN TAKING INSULIN. 300 each 3  ? Insulin Syringe-Needle U-100 (BD INSULIN SYRINGE U/F) 30G X 1/2" 0.5 ML MISC USE 2x a day WHEN TAKING INSULIN 200 each 3  ? triamcinolone ointment (KENALOG) 0.1 % APPLY 1 APPLICATION TOPICALLY 2 (TWO) TIMES DAILY AS NEEDED. LIMIT USE DUE RISK OF SKIN DISCOLORATION. 454 g 1  ? ?No current facility-administered medications on file prior to visit.  ? ?No Known Allergies ?Family History  ?Problem Relation Age of Onset  ? Diabetes Father   ? Depression Paternal Grandmother   ? Depression Paternal Grandfather   ? Depression Cousin   ? Breast cancer Neg Hx   ? Arthritis/Rheumatoid Neg Hx   ? ?PE: ?BP 120/82 (BP Location: Right Arm, Patient Position: Sitting, Cuff Size: Normal)   Pulse 78   Ht 5' 2.94" (1.599 m)   Wt 118 lb 6.4 oz (53.7 kg)   SpO2 97%   BMI 21.01 kg/m?  ?Wt Readings from Last  3 Encounters:  ?04/21/22 118 lb 6.4 oz (53.7 kg)  ?12/16/21 120 lb 6.4 oz (54.6 kg)  ?07/29/21 123 lb 9.6 oz (56.1 kg)  ? ?Constitutional: normal weight, in NAD ?Eyes: PERRLA, EOMI, no exophthalmos ?ENT: mo

## 2022-04-21 NOTE — Patient Instructions (Addendum)
Please decrease: ?- Levemir 4 units in a.m. and 3-4 units at bedtime ? ?Continue: ?- Novolog - ICR 1:13 for starches (including crackers) but use: ?   1:15 otherwise ? ?Bolus 10-15 minutes before each meal. ?Bolus 1 unit for coffee. ? ?Try to switch to [place] 2 CGM or put skin tac between skin and sensor. ? ?Please come back for a follow-up appointment in 4 months. ?

## 2022-04-22 ENCOUNTER — Other Ambulatory Visit: Payer: Self-pay

## 2022-04-22 DIAGNOSIS — E1065 Type 1 diabetes mellitus with hyperglycemia: Secondary | ICD-10-CM

## 2022-04-22 MED ORDER — DEXCOM G6 RECEIVER DEVI: 0 refills | Status: DC

## 2022-04-22 MED ORDER — DEXCOM G6 SENSOR MISC: 3 refills | Status: DC

## 2022-04-22 MED ORDER — DEXCOM G6 TRANSMITTER MISC: 3 refills | Status: DC

## 2022-04-22 NOTE — Telephone Encounter (Signed)
Pt called to request Dexcom be sent to preferred pharmacy. Freestyle Elenor Legato was too expensive to purchase without insurance.  ?

## 2022-06-04 ENCOUNTER — Telehealth: Payer: Self-pay

## 2022-06-04 ENCOUNTER — Other Ambulatory Visit: Payer: Self-pay

## 2022-06-04 ENCOUNTER — Encounter: Payer: Self-pay | Admitting: Family

## 2022-06-04 ENCOUNTER — Ambulatory Visit (INDEPENDENT_AMBULATORY_CARE_PROVIDER_SITE_OTHER): Payer: 59 | Admitting: Family

## 2022-06-04 VITALS — BP 128/64 | HR 97 | Temp 97.7°F | Ht 64.0 in | Wt 118.6 lb

## 2022-06-04 DIAGNOSIS — Z1211 Encounter for screening for malignant neoplasm of colon: Secondary | ICD-10-CM | POA: Diagnosis not present

## 2022-06-04 DIAGNOSIS — E1065 Type 1 diabetes mellitus with hyperglycemia: Secondary | ICD-10-CM | POA: Diagnosis not present

## 2022-06-04 DIAGNOSIS — Z86010 Personal history of colonic polyps: Secondary | ICD-10-CM

## 2022-06-04 DIAGNOSIS — R1031 Right lower quadrant pain: Secondary | ICD-10-CM | POA: Insufficient documentation

## 2022-06-04 MED ORDER — NA SULFATE-K SULFATE-MG SULF 17.5-3.13-1.6 GM/177ML PO SOLN: 1.0000 | Freq: Once | ORAL | 0 refills | Status: AC

## 2022-06-04 NOTE — Patient Instructions (Addendum)
Please call the office when you can to schedule fasting labs, urine and also your right hip x-ray.  As discussed, I would like you to consider at your 51th birthday starting the CT chest lung cancer screening program.  You may simply call the office and I could place a referral to [facility] pulmonology who runs this program.  You may try ibuprofen as needed for your hip pain.  It is also reasonable to try over-the-counter Salonpas pain patch or even Voltaren gel which is an ibuprofen and a gel-like form  Referral to gastroenterology for colonoscopy Let us know if you dont hear back within a week in regards to an appointment being scheduled.   Please call  and schedule your 3D mammogram and /or bone density scan as we discussed.   [facility]  ( new location in 2023)  [address], [place], [place] [address]  [place], [place]

## 2022-06-04 NOTE — Progress Notes (Signed)
Subjective:    Patient ID: Mary Haney, female    DOB: 1971-03-30, 51 y.o.   MRN: [id number]  CC: Mary Haney is a 51 y.o. female who presents today for an acute visit.    HPI: Complains right groin pain and 'popping' x 2 weeks Episodic and has occurred approx  4-5 times. It is self limiting. Works at [employer] and stands most of day.  Symptom worse at end of day.   No trouble urinating, low back pain, weakness, abnormal vaginal discharge.   No falls or injury      HISTORY:  Past Medical History:  Diagnosis Date   Alcoholic pancreatitis    Asthma    Has albuterol inhaler   Diabetes mellitus without complication ([place])    Type 2 diagnosed 2013   Past Surgical History:  Procedure Laterality Date   COLONOSCOPY WITH PROPOFOL N/A 02/11/2018   Procedure: COLONOSCOPY WITH PROPOFOL;  Surgeon: Lin Landsman, MD;  Location: [facility] ENDOSCOPY;  Service: Gastroenterology;  Laterality: N/A;   NO PAST SURGERIES     Family History  Problem Relation Age of Onset   Diabetes Father    Depression Paternal Grandmother    Depression Paternal Grandfather    Depression Cousin    Breast cancer Neg Hx    Arthritis/Rheumatoid Neg Hx     Allergies: Patient has no known allergies. Current Outpatient Medications on File Prior to Visit  Medication Sig Dispense Refill   albuterol (PROAIR HFA) 108 (90 Base) MCG/ACT inhaler Inhale 2 puffs into the lungs every 6 (six) hours as needed for wheezing or shortness of breath. 1 Inhaler 1   B-D ULTRAFINE III SHORT PEN 31G X 8 MM MISC USE FIVE TIMES DAILY 300 each 3   cetirizine (ZYRTEC) 10 MG tablet Take 10 mg by mouth daily as needed for allergies.      Continuous Blood Gluc Receiver (DEXCOM G6 RECEIVER) DEVI Use as instructed to check blood sugar. 1 each 0   Continuous Blood Gluc Sensor (DEXCOM G6 SENSOR) MISC Use as instructed to check blood sugar. Change every 10 days 9 each 3   Continuous Blood Gluc Transmit (DEXCOM G6 TRANSMITTER) MISC  Use as instructed to check blood sugar. Change every 90 days. 1 each 3   Glucagon 3 MG/DOSE POWD Place 3 mg into the nose once as needed for up to 1 dose. 1 each 11   glucose blood (FREESTYLE PRECISION NEO TEST) test strip Use as instructed 4-6x a day 100 each 11   hydrocortisone (ANUSOL-HC) 25 MG suppository Place 1 suppository (25 mg total) rectally 2 (two) times daily. 12 suppository 0   insulin aspart (NOVOLOG FLEXPEN) 100 UNIT/ML FlexPen Inject 2-9 Units into the skin 3 (three) times daily with meals. 15 mL 3   insulin glargine-yfgn (SEMGLEE) 100 UNIT/ML Pen Inject 10-12 Units into the skin daily. 15 mL 2   Insulin Pen Needle (B-D UF III MINI PEN NEEDLES) 31G X 5 MM MISC USE 5x WHEN TAKING INSULIN. 300 each 3   Insulin Syringe-Needle U-100 (BD INSULIN SYRINGE U/F) 30G X 1/2" 0.5 ML MISC USE 2x a day WHEN TAKING INSULIN 200 each 3   triamcinolone ointment (KENALOG) 0.1 % APPLY 1 APPLICATION TOPICALLY 2 (TWO) TIMES DAILY AS NEEDED. LIMIT USE DUE RISK OF SKIN DISCOLORATION. 454 g 1   No current facility-administered medications on file prior to visit.    Social History   Tobacco Use   Smoking status: Every Day    Packs/day: 0.50  Years: 25.00    Total pack years: 12.50    Types: Cigarettes   Smokeless tobacco: Never   Tobacco comments:    Has Chantix  Substance Use Topics   Alcohol use: Yes    Alcohol/week: 3.0 standard drinks of alcohol    Types: 3 Cans of beer per week   Drug use: No    Review of Systems  Constitutional:  Negative for chills and fever.  Respiratory:  Negative for cough.   Cardiovascular:  Negative for chest pain and palpitations.  Gastrointestinal:  Negative for nausea and vomiting.  Musculoskeletal:  Negative for back pain.  Neurological:  Negative for numbness.      Objective:    BP 128/64 (BP Location: Left Arm, Patient Position: Sitting, Cuff Size: Small)   Pulse 97   Temp 97.7 F (36.5 C) (Oral)   Ht '5\' 4"'$  (1.626 m)   Wt 118 lb 9.6 oz (53.8  kg)   LMP 05/21/2022   SpO2 98%   BMI 20.36 kg/m    Physical Exam Vitals reviewed.  Constitutional:      Appearance: She is well-developed.  Eyes:     Conjunctiva/sclera: Conjunctivae normal.  Cardiovascular:     Rate and Rhythm: Normal rate and regular rhythm.     Pulses: Normal pulses.     Heart sounds: Normal heart sounds.  Pulmonary:     Effort: Pulmonary effort is normal.     Breath sounds: Normal breath sounds. No wheezing, rhonchi or rales.  Musculoskeletal:     Lumbar back: No swelling, edema, spasms, tenderness or bony tenderness. Normal range of motion.     Comments: Full range of motion with flexion, tension, lateral side bends. No bony tenderness. No pain, numbness, tingling elicited with single leg raise bilaterally.  Right Hip: No limp or waddling gait. Full ROM with flexion and hip rotation in flexion.    Pain of lateral hip with  (flexion-abduction-external rotation) test.   No pain with deep palpation of greater trochanter.     Skin:    General: Skin is warm and dry.  Neurological:     Mental Status: She is alert.     Sensory: No sensory deficit.     Deep Tendon Reflexes:     Reflex Scores:      Patellar reflexes are 2+ on the right side and 2+ on the left side.    Comments: Sensation and strength intact bilateral lower extremities.  Psychiatric:        Speech: Speech normal.        Behavior: Behavior normal.        Thought Content: Thought content normal.        Assessment & Plan:   Problem List Items Addressed This Visit       Endocrine   Uncontrolled type 1 diabetes mellitus with hyperglycemia, with long-term current use of insulin (HCC)   Relevant Orders   Microalbumin / creatinine urine ratio   Comprehensive metabolic panel   Lipid panel     Other   Right groin pain - Primary    Concern for hip etiology. Pending right hip XR. Advised trial of salon pas, voltaren gel, ibuprofen prn. Consider PT after results of xray.         Relevant Orders   DG Hip Unilat W OR W/O Pelvis 2-3 Views Right   Other Visit Diagnoses     Encounter for screening colonoscopy       Relevant Orders   Ambulatory referral  to Gastroenterology         I am having Mary Haney maintain her cetirizine, hydrocortisone, albuterol, B-D UF III MINI PEN NEEDLES, BD Insulin Syringe U/F, FreeStyle Precision Neo Test, Glucagon, triamcinolone ointment, NovoLOG FlexPen, insulin glargine-yfgn, B-D ULTRAFINE III SHORT PEN, Dexcom G6 Transmitter, Dexcom G6 Receiver, and Dexcom G6 Sensor.   No orders of the defined types were placed in this encounter.   Return precautions given.   Risks, benefits, and alternatives of the medications and treatment plan prescribed today were discussed, and patient expressed understanding.   Education regarding symptom management and diagnosis given to patient on AVS.  Continue to follow with Burnard Hawthorne, FNP for routine health maintenance.   Mary Haney and I agreed with plan.   Mable Paris, FNP

## 2022-06-04 NOTE — Telephone Encounter (Signed)
Gastroenterology Pre-Procedure Review  Request Date: 06/24/22 Requesting Physician: Dr. Marius Ditch  PATIENT REVIEW QUESTIONS: The patient responded to the following health history questions as indicated:    1. Are you having any GI issues? no 2. Do you have a personal history of Polyps? yes (Colonoscopy performed by Dr. Marius Ditch 02/11/18 polyps noted) 3. Do you have a family history of Colon Cancer or Polyps? no 4. Diabetes Mellitus? yes (type 2) 5. Joint replacements in the past 12 months?no 6. Major health problems in the past 3 months?no 7. Any artificial heart valves, MVP, or defibrillator?no    MEDICATIONS & ALLERGIES:    Patient reports the following regarding taking any anticoagulation/antiplatelet therapy:   Plavix, Coumadin, Eliquis, Xarelto, Lovenox, Pradaxa, Brilinta, or Effient? no Aspirin? no  Patient confirms/reports the following medications:  Current Outpatient Medications  Medication Sig Dispense Refill   albuterol (PROAIR HFA) 108 (90 Base) MCG/ACT inhaler Inhale 2 puffs into the lungs every 6 (six) hours as needed for wheezing or shortness of breath. 1 Inhaler 1   B-D ULTRAFINE III SHORT PEN 31G X 8 MM MISC USE FIVE TIMES DAILY 300 each 3   cetirizine (ZYRTEC) 10 MG tablet Take 10 mg by mouth daily as needed for allergies.      Continuous Blood Gluc Receiver (DEXCOM G6 RECEIVER) DEVI Use as instructed to check blood sugar. 1 each 0   Continuous Blood Gluc Sensor (DEXCOM G6 SENSOR) MISC Use as instructed to check blood sugar. Change every 10 days 9 each 3   Continuous Blood Gluc Transmit (DEXCOM G6 TRANSMITTER) MISC Use as instructed to check blood sugar. Change every 90 days. 1 each 3   Glucagon 3 MG/DOSE POWD Place 3 mg into the nose once as needed for up to 1 dose. 1 each 11   glucose blood (FREESTYLE PRECISION NEO TEST) test strip Use as instructed 4-6x a day 100 each 11   hydrocortisone (ANUSOL-HC) 25 MG suppository Place 1 suppository (25 mg total) rectally 2 (two)  times daily. 12 suppository 0   insulin aspart (NOVOLOG FLEXPEN) 100 UNIT/ML FlexPen Inject 2-9 Units into the skin 3 (three) times daily with meals. 15 mL 3   insulin glargine-yfgn (SEMGLEE) 100 UNIT/ML Pen Inject 10-12 Units into the skin daily. 15 mL 2   Insulin Pen Needle (B-D UF III MINI PEN NEEDLES) 31G X 5 MM MISC USE 5x WHEN TAKING INSULIN. 300 each 3   Insulin Syringe-Needle U-100 (BD INSULIN SYRINGE U/F) 30G X 1/2" 0.5 ML MISC USE 2x a day WHEN TAKING INSULIN 200 each 3   triamcinolone ointment (KENALOG) 0.1 % APPLY 1 APPLICATION TOPICALLY 2 (TWO) TIMES DAILY AS NEEDED. LIMIT USE DUE RISK OF SKIN DISCOLORATION. 454 g 1   No current facility-administered medications for this visit.    Patient confirms/reports the following allergies:  No Known Allergies  No orders of the defined types were placed in this encounter.   AUTHORIZATION INFORMATION Primary Insurance: 1D#: Group #:  Secondary Insurance: 1D#: Group #:  SCHEDULE INFORMATION: Date: 06/24/22 Time: Location: [facility]

## 2022-06-04 NOTE — Assessment & Plan Note (Addendum)
Concern for hip etiology. Pending right hip XR. Advised trial of salon pas, voltaren gel, ibuprofen prn. Consider PT after results of xray.

## 2022-06-04 NOTE — Telephone Encounter (Signed)
Gastroenterology Pre-Procedure Review  Request Date: 06/24/22 Requesting Physician: Dr. Marius Ditch  PATIENT REVIEW QUESTIONS: The patient responded to the following health history questions as indicated:    1. Are you having any GI issues? no 2. Do you have a personal history of Polyps? yes (02/11/2018 colonoscopy performed by Dr. Marius Ditch noted 71m sessile polyp) 3. Do you have a family history of Colon Cancer or Polyps? no 4. Diabetes Mellitus? yes (type 2) 5. Joint replacements in the past 12 months?no 6. Major health problems in the past 3 months?no 7. Any artificial heart valves, MVP, or defibrillator?no    MEDICATIONS & ALLERGIES:    Patient reports the following regarding taking any anticoagulation/antiplatelet therapy:   Plavix, Coumadin, Eliquis, Xarelto, Lovenox, Pradaxa, Brilinta, or Effient? no Aspirin? no  Patient confirms/reports the following medications:  Current Outpatient Medications  Medication Sig Dispense Refill   albuterol (PROAIR HFA) 108 (90 Base) MCG/ACT inhaler Inhale 2 puffs into the lungs every 6 (six) hours as needed for wheezing or shortness of breath. 1 Inhaler 1   B-D ULTRAFINE III SHORT PEN 31G X 8 MM MISC USE FIVE TIMES DAILY 300 each 3   cetirizine (ZYRTEC) 10 MG tablet Take 10 mg by mouth daily as needed for allergies.      Continuous Blood Gluc Receiver (DEXCOM G6 RECEIVER) DEVI Use as instructed to check blood sugar. 1 each 0   Continuous Blood Gluc Sensor (DEXCOM G6 SENSOR) MISC Use as instructed to check blood sugar. Change every 10 days 9 each 3   Continuous Blood Gluc Transmit (DEXCOM G6 TRANSMITTER) MISC Use as instructed to check blood sugar. Change every 90 days. 1 each 3   Glucagon 3 MG/DOSE POWD Place 3 mg into the nose once as needed for up to 1 dose. 1 each 11   glucose blood (FREESTYLE PRECISION NEO TEST) test strip Use as instructed 4-6x a day 100 each 11   hydrocortisone (ANUSOL-HC) 25 MG suppository Place 1 suppository (25 mg total) rectally  2 (two) times daily. 12 suppository 0   insulin aspart (NOVOLOG FLEXPEN) 100 UNIT/ML FlexPen Inject 2-9 Units into the skin 3 (three) times daily with meals. 15 mL 3   insulin glargine-yfgn (SEMGLEE) 100 UNIT/ML Pen Inject 10-12 Units into the skin daily. 15 mL 2   Insulin Pen Needle (B-D UF III MINI PEN NEEDLES) 31G X 5 MM MISC USE 5x WHEN TAKING INSULIN. 300 each 3   Insulin Syringe-Needle U-100 (BD INSULIN SYRINGE U/F) 30G X 1/2" 0.5 ML MISC USE 2x a day WHEN TAKING INSULIN 200 each 3   Na Sulfate-K Sulfate-Mg Sulf 17.5-3.13-1.6 GM/177ML SOLN Take 1 kit by mouth once for 1 dose. 354 mL 0   triamcinolone ointment (KENALOG) 0.1 % APPLY 1 APPLICATION TOPICALLY 2 (TWO) TIMES DAILY AS NEEDED. LIMIT USE DUE RISK OF SKIN DISCOLORATION. 454 g 1   No current facility-administered medications for this visit.    Patient confirms/reports the following allergies:  No Known Allergies  No orders of the defined types were placed in this encounter.   AUTHORIZATION INFORMATION Primary Insurance: 1D#: Group #:  Secondary Insurance: 1D#: Group #:  SCHEDULE INFORMATION: Date: 06/24/22 Time: Location: [facility]

## 2022-06-04 NOTE — Progress Notes (Signed)
AMBLast two weeks just started hurting. No falls, has not taking anything just tries to keep pressure off when she feels the pain. Needs colonoscopy and eye exam

## 2022-06-23 ENCOUNTER — Telehealth: Payer: Self-pay | Admitting: Gastroenterology

## 2022-06-23 ENCOUNTER — Telehealth: Payer: Self-pay

## 2022-06-23 NOTE — Telephone Encounter (Signed)
Cancel colonoscopy, patient will call on a later date to reschedule.

## 2022-06-23 NOTE — Telephone Encounter (Signed)
Returned patients call to let her know message was received to cancel tomorrows colonoscopy.  She said she will call back to reschedule later.  Canceling due to insurance.  Thanks, [place], [place]

## 2022-06-24 ENCOUNTER — Encounter: Admission: RE | Payer: Self-pay | Source: Home / Self Care

## 2022-06-24 ENCOUNTER — Ambulatory Visit: Admission: RE | Admit: 2022-06-24 | Payer: 59 | Source: Home / Self Care | Admitting: Gastroenterology

## 2022-06-24 SURGERY — COLONOSCOPY WITH PROPOFOL
Anesthesia: General

## 2022-07-01 ENCOUNTER — Ambulatory Visit
Admission: RE | Admit: 2022-07-01 | Discharge: 2022-07-01 | Disposition: A | Discharge status: Home / Self Care | Payer: 59 | Source: Ambulatory Visit | Attending: Family | Admitting: Family

## 2022-07-01 DIAGNOSIS — Z1231 Encounter for screening mammogram for malignant neoplasm of breast: Secondary | ICD-10-CM | POA: Insufficient documentation

## 2022-07-18 ENCOUNTER — Encounter: Payer: Self-pay | Admitting: Family

## 2022-08-14 ENCOUNTER — Other Ambulatory Visit: Payer: Self-pay | Admitting: Family

## 2022-08-14 DIAGNOSIS — L309 Dermatitis, unspecified: Secondary | ICD-10-CM

## 2022-08-18 ENCOUNTER — Encounter: Payer: Self-pay | Admitting: Internal Medicine

## 2022-08-18 ENCOUNTER — Ambulatory Visit (INDEPENDENT_AMBULATORY_CARE_PROVIDER_SITE_OTHER): Payer: 59 | Admitting: Internal Medicine

## 2022-08-18 VITALS — BP 120/78 | HR 105 | Ht 64.0 in | Wt 110.6 lb

## 2022-08-18 DIAGNOSIS — E0789 Other specified disorders of thyroid: Secondary | ICD-10-CM

## 2022-08-18 DIAGNOSIS — E1065 Type 1 diabetes mellitus with hyperglycemia: Secondary | ICD-10-CM | POA: Diagnosis not present

## 2022-08-18 LAB — POCT GLYCOSYLATED HEMOGLOBIN (HGB A1C): Hemoglobin A1C: 7.4 % — AB (ref 4.0–5.6)

## 2022-08-18 NOTE — Progress Notes (Signed)
Patient ID: Mary Haney, female   DOB: 1971/08/26, 51 y.o.   MRN: [id number]  HPI: Mary Haney is a 51 y.o.-year-old female, returning for f/u for DM, dx 2013, insulin-dependent, uncontrolled, without long term complications. Last visit was 4 months ago. PCP: Dr. Caryl Bis  Interim history: She continues to be very active at work.  She has skipped meals again since last visit. No increased urination, blurry vision, nausea, chest pain.  However, she lost 8 pounds since last visit. She stopped her Dexcom sensor 3 weeks ago due to skin irritation.  The freestyle libre sensor was not approved for her.  She noticed that sugars were higher lately.  Reviewed history: In 07/2012 she went to the ED because she was not feeling well. She was found to have a blood glucose level of 600 + and was admitted with DKA. She was followed by [name] immediately following initial diagnosis, then switched to [facility] endocrinology.   In 10/2013, due to lack of finances, she was stretching the insulin doses >> sugars high >> low CBG 25-26 >> ED.    She was admitted in 04/2016 for DKA.  At the end of 2017, she met the DM educator and nutritionist >> decided for Omnipod pump >> but nobody called her back and then she could not afford it anymore.  She decided against that insulin pump, afterwards, but she now has a CGM.  Reviewed HbA1c levels: Lab Results  Component Value Date   HGBA1C 6.3 (A) 04/21/2022   HGBA1C 6.3 (A) 12/16/2021   HGBA1C 6.1 (A) 07/29/2021   HGBA1C 6.5 (A) 03/18/2021   HGBA1C 7.1 (A) 12/17/2020   HGBA1C 7.9 (H) 07/25/2020   HGBA1C 8.8 (A) 04/02/2020   HGBA1C 8.7 (A) 12/27/2019   HGBA1C 6.9 (A) 09/20/2019   HGBA1C 8.5 (A) 06/14/2019   HGBA1C 9.1 (A) 02/15/2019   HGBA1C 7.4 (A) 06/22/2018   HGBA1C 7.7 03/18/2018   HGBA1C 7.9 12/17/2017   HGBA1C 7.9 09/15/2017   HGBA1C 8.1 04/02/2017   HGBA1C 9.0 11/04/2016   HGBA1C 10.8 (H) 08/07/2016   HGBA1C 10.3 (H) 05/07/2016   HGBA1C  7.8 (H) 04/03/2015   Pt is on a regimen of: - Lantus >> Levemir 5 units in a.m. and 5 units at bedtime >> 4 units in a.m. and 3 to 4 >> 4-5 units at bedtime - Novolog - ICR 1:13 for starches (including crackers) but use:    1:15 otherwise -ends up with approximately 2 units per meal.  She checks her sugars more than 4 times a day: - am: 210-270 - 2h after b'fast: n/c - lunch: 60-170s - 2h after lunch: n/c - dinner: 40, 60-170 - 2h after dinner: n/c - bedtime: <200  Previously:   Previously:   Initially:    Lowest sugar was  37!!! >> 40 >> 40 (skipping meals); she has hypoglycemia awareness in the 60s. Highest sugar was 400 >> ... 200s >> 340 (ate but did not take insulin).  Has ReliOn meter.  Pt's meals are: - coffee + cream + stevia right after she wakes up at 6:45 - Breakfast: oatmeal, yoghurt, sausage bisquit - grapes! - Lunch: salad, soup, sandwiches - Dinner: meat + starch + vegetables - Snacks: yoghurt, peanut crackers, nuts  She is very busy at work and does not have time allotted for lunch. She continues to grab lunch to go. She cannot inject insulin 15 minutes before lunch many times.  -No CKD: Lab Results  Component Value Date   BUN  7 02/25/2021   Lab Results  Component Value Date   CREATININE 0.70 02/25/2021   -She has a history of dyslipidemia: Lab Results  Component Value Date   CHOL 138 02/25/2021   HDL 60 02/25/2021   LDLCALC 39 02/25/2021   LDLDIRECT 59.0 06/14/2019   TRIG 372 (H) 02/25/2021   CHOLHDL 2.3 02/25/2021   - last eye exam was in 06/2020: No DR  - no numbness and tingling in her feet.  Last foot exam 04/2022.  Latest TSH was normal: Lab Results  Component Value Date   TSH 0.75 02/25/2021   Her thyroid remains palpable.  Pt denies: - feeling nodules in neck - hoarseness - dysphagia - choking  ROS: + see HPI  I reviewed pt's medications, allergies, PMH, social hx, family hx, and changes were documented in the  history of present illness. Otherwise, unchanged from my initial visit note.  Past Medical History:  Diagnosis Date   Alcoholic pancreatitis    Asthma    Has albuterol inhaler   Diabetes mellitus without complication ([place])    Type 2 diagnosed 2013   Past Surgical History:  Procedure Laterality Date   COLONOSCOPY WITH PROPOFOL N/A 02/11/2018   Procedure: COLONOSCOPY WITH PROPOFOL;  Surgeon: Lin Landsman, MD;  Location: [facility] ENDOSCOPY;  Service: Gastroenterology;  Laterality: N/A;   NO PAST SURGERIES     History   Social History   Marital Status: Single    Spouse Name: N/A    Number of Children: 0   Years of Education: 14   Occupational History   [employer]ist     [place]   Social History Main Topics   Smoking status: Current Every Day Smoker -- 0.50 packs/day for 25 years    Types: Cigarettes   Smokeless tobacco: Never Used   Alcohol Use: 1.8 oz/week    3 Cans of beer per week   Drug Use: No   Sexual Activity: Yes    Birth Control/ Protection: Condom   Social History Narrative   Mary Haney was born in [place], [place]. She moved with her family to [place] at age 19. She attended 2 years at [employer] and was majoring in [place]. Her goal is to become a Optometrist in the [employer]. 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 Mary Haney lives with her boyfriend, [name]. They are considering getting married soon. She enjoys sketching outdoor scenery, cartoons and loves to read.   Current Outpatient Medications on File Prior to Visit  Medication Sig Dispense Refill   albuterol (PROAIR HFA) 108 (90 Base) MCG/ACT inhaler Inhale 2 puffs into the lungs every 6 (six) hours as needed for wheezing or shortness of breath. 1 Inhaler 1   B-D ULTRAFINE III SHORT PEN 31G X 8 MM MISC USE FIVE TIMES DAILY 300 each 3   cetirizine (ZYRTEC) 10 MG tablet Take 10 mg by mouth daily as needed for allergies.      Continuous Blood Gluc Receiver (DEXCOM  G6 RECEIVER) DEVI Use as instructed to check blood sugar. 1 each 0   Continuous Blood Gluc Sensor (DEXCOM G6 SENSOR) MISC Use as instructed to check blood sugar. Change every 10 days 9 each 3   Continuous Blood Gluc Transmit (DEXCOM G6 TRANSMITTER) MISC Use as instructed to check blood sugar. Change every 90 days. 1 each 3   Glucagon 3 MG/DOSE POWD Place 3 mg into the nose once as needed for up to 1 dose.  1 each 11   glucose blood (FREESTYLE PRECISION NEO TEST) test strip Use as instructed 4-6x a day 100 each 11   hydrocortisone (ANUSOL-HC) 25 MG suppository Place 1 suppository (25 mg total) rectally 2 (two) times daily. 12 suppository 0   insulin aspart (NOVOLOG FLEXPEN) 100 UNIT/ML FlexPen Inject 2-9 Units into the skin 3 (three) times daily with meals. 15 mL 3   insulin glargine-yfgn (SEMGLEE) 100 UNIT/ML Pen Inject 10-12 Units into the skin daily. 15 mL 2   Insulin Pen Needle (B-D UF III MINI PEN NEEDLES) 31G X 5 MM MISC USE 5x WHEN TAKING INSULIN. 300 each 3   Insulin Syringe-Needle U-100 (BD INSULIN SYRINGE U/F) 30G X 1/2" 0.5 ML MISC USE 2x a day WHEN TAKING INSULIN 200 each 3   triamcinolone ointment (KENALOG) 0.1 % APPLY 1 APPLICATION TOPICALLY 2 TIMES DAILY AS NEEDED. LIMIT USE DUE RISK OF SKIN DISCOLORATION. 465 g 1   No current facility-administered medications on file prior to visit.   No Known Allergies Family History  Problem Relation Age of Onset   Diabetes Father    Depression Paternal Grandmother    Depression Paternal Grandfather    Depression Cousin    Breast cancer Neg Hx    Arthritis/Rheumatoid Neg Hx    PE: BP 120/78 (BP Location: Right Arm, Patient Position: Sitting, Cuff Size: Normal)   Pulse (!) 105   Ht _0  (1.626 m)   Wt 110 lb 9.6 oz (50.2 kg)   SpO2 99%   BMI 18.98 kg/m  Wt Readings from Last 3 Encounters:  08/18/22 110 lb 9.6 oz (50.2 kg)  06/04/22 118 lb 9.6 oz (53.8 kg)  04/21/22 118 lb 6.4 oz (53.7 kg)   Constitutional: thin, in NAD Eyes:  EOMI, no exophthalmos ENT: moist mucous membranes, + slight, symmetric thyromegaly, no cervical lymphadenopathy Cardiovascular: RRR, No MRG Respiratory: CTA B Musculoskeletal: no deformities Skin: moist, warm, no rashes Neurological: no tremor with outstretched hands  ASSESSMENT: 1. DM, insulin-dependent, uncontrolled, with complications - hyper- and hypo-glycemia - History of DKA  Component     Latest Ref Rng & Units 11/04/2016  Hemoglobin A1C      9.0  C-Peptide     0.80 - 3.85 ng/mL <0.10 (L)  Glucose, Fasting     65 - 99 mg/dL 40 (L)  Glutamic Acid Decarb Ab     <5 IU/mL >250 (H)  Pancreatic Islet Cell Antibody     <5 JDF Units <5   Glucose very low at the time of the blood draw, therefore, C-peptide is not interpretable. However, GAD antibodies are undetectably high, confirming type 1 diabetes.  2.  Palpable thyroid  PLAN:  1. Patient with history of uncontrolled type 1 diabetes, on basal bolus insulin regimen, with increasing insulin sensitivity, managed with low insulin doses.  Diabetes started to improve after she started the CGM.  At last visit, sugars are fluctuating mostly in the normal range, but they were higher right after meals.  She also had low blood sugars throughout the day and early in the morning.  This pattern was consistent with excessive basal insulin.  We decreased her Levemir dose both in the morning and at night.  She was having problems with the Dexcom G6 CGM-rash and I advised her to spray Flonase or to use anti-itch cortisone cream or to buy skin tac solid barrier to put between her skin and sensor.  I did also sent a prescription for the freestyle libre 2 CGM  to her pharmacy. -At today's visit, she does not have the CGM attached -she had to stop the Dexcom due to skin irritation.  Freestyle libre sensor was not covered.  She tried skin tac patches, but they did not protect her skin.  At this visit, we restarted several skin barrier methods and I  suggested to get the Tegaderm film and place this b/w the skin and the sensor.  She will try this.  I advised her to let me know if this does not work, in which case, will need a preauthorization form for the freestyle libre sensor. -In the meantime, her sugars are quite high in the morning (she now checks her with her ReliOn meter).  I did advise her to increase her evening Levemir and, if the sugars decreased during the day afterwards, to decrease the morning dose.  Other than that, I did advise her to try her best to inject insulin before each meal and leaving 15 minutes between the bolus and the meal, but otherwise, I did not advise her to change the rest of her regimen. -  I suggested to:  Patient Instructions  Please use: - Levemir (3-)4 units in a.m. and 5 units at bedtime - Novolog - ICR 1:13 for starches (including crackers) but use:    1:15 otherwise Bolus 10-15 minutes before each meal. Bolus 1 unit for coffee.  Try to put Tega derm between skin and sensor.  Please come back for a follow-up appointment in 4 months.  - we checked her HbA1c: 7.4% (higher) - advised to check sugars at different times of the day - 4x a day, rotating check times - advised for yearly eye exams >> she is not UTD -she will schedule this (now has a new insurance) - return to clinic in 4 months  2.  Palpable thyroid -It is symmetric and nonnodular on palpation -No neck compression symptoms -TSH was normal at last check Lab Results  Component Value Date   TSH 0.75 02/25/2021  -We are following her clinically  Philemon Kingdom, MD PhD [facility] Endocrinology

## 2022-08-18 NOTE — Patient Instructions (Addendum)
Please use: - Levemir (3-)4 units in a.m. and 5 units at bedtime - Novolog - ICR 1:13 for starches (including crackers) but use:    1:15 otherwise Bolus 10-15 minutes before each meal. Bolus 1 unit for coffee.  Try to put Tega derm between skin and sensor.  Please come back for a follow-up appointment in 4 months.

## 2022-08-19 LAB — COMPLETE METABOLIC PANEL WITH GFR
AG Ratio: 1.6 (calc) (ref 1.0–2.5)
ALT: 10 U/L (ref 6–29)
AST: 21 U/L (ref 10–35)
Albumin: 5.1 g/dL (ref 3.6–5.1)
Alkaline phosphatase (APISO): 51 U/L (ref 31–125)
BUN: 9 mg/dL (ref 7–25)
CO2: 27 mmol/L (ref 20–32)
Calcium: 10.1 mg/dL (ref 8.6–10.2)
Chloride: 99 mmol/L (ref 98–110)
Creat: 0.83 mg/dL (ref 0.50–0.99)
Globulin: 3.1 g/dL (calc) (ref 1.9–3.7)
Glucose, Bld: 124 mg/dL — ABNORMAL HIGH (ref 65–99)
Potassium: 4.7 mmol/L (ref 3.5–5.3)
Sodium: 138 mmol/L (ref 135–146)
Total Bilirubin: 0.4 mg/dL (ref 0.2–1.2)
Total Protein: 8.2 g/dL — ABNORMAL HIGH (ref 6.1–8.1)
eGFR: 86 mL/min/{1.73_m2} (ref >=60)

## 2022-08-19 LAB — LIPID PANEL
Cholesterol: 180 mg/dL (ref 0–200)
HDL: 118.9 mg/dL (ref >39.00)
LDL Cholesterol: 49 mg/dL (ref 0–99)
NonHDL: 61.17
Total CHOL/HDL Ratio: 2
Triglycerides: 61 mg/dL (ref 0.0–149.0)
VLDL: 12.2 mg/dL (ref 0.0–40.0)

## 2022-08-19 LAB — MICROALBUMIN / CREATININE URINE RATIO
Creatinine,U: 101.1 mg/dL
Microalb Creat Ratio: 3.8 mg/g (ref 0.0–30.0)
Microalb, Ur: 3.8 mg/dL — ABNORMAL HIGH (ref 0.0–1.9)

## 2022-08-19 LAB — TSH: TSH: 1.62 u[IU]/mL (ref 0.35–5.50)

## 2022-10-01 ENCOUNTER — Other Ambulatory Visit: Payer: Self-pay

## 2022-10-01 DIAGNOSIS — E1065 Type 1 diabetes mellitus with hyperglycemia: Secondary | ICD-10-CM

## 2022-10-01 MED ORDER — LEVEMIR FLEXPEN 100 UNIT/ML ~~LOC~~ SOPN: 12.0000 [IU] | PEN_INJECTOR | Freq: Every day | SUBCUTANEOUS | 1 refills | Status: DC

## 2022-10-01 NOTE — Telephone Encounter (Signed)
Pt called requesting a refill for Levemir. Rx sent to preferred pharmacy.

## 2022-10-05 ENCOUNTER — Encounter: Payer: Self-pay | Admitting: Family

## 2022-10-08 ENCOUNTER — Encounter: Payer: Self-pay | Admitting: Family

## 2022-10-08 ENCOUNTER — Ambulatory Visit: Payer: 59 | Admitting: Family

## 2022-10-08 ENCOUNTER — Other Ambulatory Visit: Payer: Self-pay | Admitting: Internal Medicine

## 2022-10-08 VITALS — BP 138/86 | HR 99 | Temp 98.5°F | Ht 64.0 in | Wt 111.2 lb

## 2022-10-08 DIAGNOSIS — M542 Cervicalgia: Secondary | ICD-10-CM

## 2022-10-08 MED ORDER — MELOXICAM 7.5 MG PO TABS: 7.5000 mg | ORAL_TABLET | Freq: Every day | ORAL | 1 refills | Status: DC | PRN

## 2022-10-08 MED ORDER — CYCLOBENZAPRINE HCL 5 MG PO TABS: 5.0000 mg | ORAL_TABLET | Freq: Every evening | ORAL | 0 refills | Status: DC | PRN

## 2022-10-08 NOTE — Progress Notes (Signed)
Subjective:    Patient ID: Mary Haney, female    DOB: Jun 25, 1971, 51 y.o.   MRN: [id number]  CC: Mary Haney is a 51 y.o. female who presents today for an acute visit.    HPI: Complains of neck pain x2 weeks Woke up one morning and 'it was a crick in her neck' She have been taking aleve, massage without relief.  Pain radiated to left posterior shoulder.   No  fever, rash, numbness, vision changes, HA.     Palpable thyroid noted by Dr. Thea Alken, endocrine at last visit.  She is following with endocrine for this.    HISTORY:  Past Medical History:  Diagnosis Date  . Alcoholic pancreatitis   . Asthma    Has albuterol inhaler  . Diabetes mellitus without complication ([place])    Type 2 diagnosed 2013   Past Surgical History:  Procedure Laterality Date  . COLONOSCOPY WITH PROPOFOL N/A 02/11/2018   Procedure: COLONOSCOPY WITH PROPOFOL;  Surgeon: Lin Landsman, MD;  Location: [facility] ENDOSCOPY;  Service: Gastroenterology;  Laterality: N/A;  . NO PAST SURGERIES     Family History  Problem Relation Age of Onset  . Diabetes Father   . Depression Paternal Grandmother   . Depression Paternal Grandfather   . Depression Cousin   . Breast cancer Neg Hx   . Arthritis/Rheumatoid Neg Hx     Allergies: Patient has no known allergies. Current Outpatient Medications on File Prior to Visit  Medication Sig Dispense Refill  . albuterol (PROAIR HFA) 108 (90 Base) MCG/ACT inhaler Inhale 2 puffs into the lungs every 6 (six) hours as needed for wheezing or shortness of breath. 1 Inhaler 1  . B-D ULTRAFINE III SHORT PEN 31G X 8 MM MISC USE FIVE TIMES DAILY 300 each 3  . cetirizine (ZYRTEC) 10 MG tablet Take 10 mg by mouth daily as needed for allergies.     . Continuous Blood Gluc Receiver (DEXCOM G6 RECEIVER) DEVI Use as instructed to check blood sugar. 1 each 0  . Continuous Blood Gluc Sensor (DEXCOM G6 SENSOR) MISC Use as instructed to check blood sugar. Change every 10 days 9 each 3   . Continuous Blood Gluc Transmit (DEXCOM G6 TRANSMITTER) MISC Use as instructed to check blood sugar. Change every 90 days. 1 each 3  . Glucagon 3 MG/DOSE POWD Place 3 mg into the nose once as needed for up to 1 dose. 1 each 11  . glucose blood (FREESTYLE PRECISION NEO TEST) test strip Use as instructed 4-6x a day 100 each 11  . hydrocortisone (ANUSOL-HC) 25 MG suppository Place 1 suppository (25 mg total) rectally 2 (two) times daily. 12 suppository 0  . insulin aspart (NOVOLOG FLEXPEN) 100 UNIT/ML FlexPen Inject 2-9 Units into the skin 3 (three) times daily with meals. 15 mL 3  . insulin detemir (LEVEMIR FLEXPEN) 100 UNIT/ML FlexPen Inject 12 Units into the skin daily. 15 mL 1  . insulin glargine-yfgn (SEMGLEE) 100 UNIT/ML Pen Inject 10-12 Units into the skin daily. 15 mL 2  . Insulin Pen Needle (B-D UF III MINI PEN NEEDLES) 31G X 5 MM MISC USE 5x WHEN TAKING INSULIN. 300 each 3  . Insulin Syringe-Needle U-100 (BD INSULIN SYRINGE U/F) 30G X 1/2" 0.5 ML MISC USE 2x a day WHEN TAKING INSULIN 200 each 3  . triamcinolone ointment (KENALOG) 0.1 % APPLY 1 APPLICATION TOPICALLY 2 TIMES DAILY AS NEEDED. LIMIT USE DUE RISK OF SKIN DISCOLORATION. 465 g 1   No current facility-administered  medications on file prior to visit.    Social History   Tobacco Use  . Smoking status: Every Day    Packs/day: 0.50    Years: 25.00    Total pack years: 12.50    Types: Cigarettes  . Smokeless tobacco: Never  . Tobacco comments:    Has Chantix  Substance Use Topics  . Alcohol use: Yes    Alcohol/week: 3.0 standard drinks of alcohol    Types: 3 Cans of beer per week  . Drug use: No    Review of Systems    Objective:    BP 138/86 (BP Location: Left Arm, Patient Position: Sitting, Cuff Size: Normal)   Pulse 99   Temp 98.5 F (36.9 C) (Oral)   Ht '5\' 4"'$  (1.626 m)   Wt 111 lb 3.2 oz (50.4 kg)   SpO2 98%   BMI 19.09 kg/m    Physical Exam     Assessment & Plan:      I am having Mary Haney maintain her cetirizine, hydrocortisone, albuterol, B-D UF III MINI PEN NEEDLES, BD Insulin Syringe U/F, FreeStyle Precision Neo Test, Glucagon, NovoLOG FlexPen, insulin glargine-yfgn, B-D ULTRAFINE III SHORT PEN, Dexcom G6 Transmitter, Dexcom G6 Receiver, Dexcom G6 Sensor, triamcinolone ointment, and Levemir FlexPen.   No orders of the defined types were placed in this encounter.   Return precautions given.   Risks, benefits, and alternatives of the medications and treatment plan prescribed today were discussed, and patient expressed understanding.   Education regarding symptom management and diagnosis given to patient on AVS.  Continue to follow with Burnard Hawthorne, FNP for routine health maintenance.   Mary Haney and I agreed with plan.   Mable Paris, FNP

## 2022-10-08 NOTE — Patient Instructions (Addendum)
   As discussed, if your symptom do not improve, you may go to walk in orthopedic clinic. Information below:  [facility]  Monday-Friday 8am-9pm Saturday and Sunday 9am- 9pm   [phone]

## 2022-10-09 NOTE — Assessment & Plan Note (Signed)
Presentation consistent with muscle spasm.  Shoulder exam reassuring.  start meloxicam, Flexeril as needed.  We suspect likely degenerative arthritic changes present as well.  We opted to defer dedicated imaging of the neck , left shoulder at this time and trial conservative management first.  If no improvement of symptoms, I advised imaging and PT consult.  Also provided with information regarding [facility] if pain were to escalate and she wanted a same-day appointment with an orthopedic.

## 2022-10-14 ENCOUNTER — Encounter: Payer: Self-pay | Admitting: Family

## 2022-10-14 ENCOUNTER — Telehealth: Payer: Self-pay

## 2022-10-14 DIAGNOSIS — J45909 Unspecified asthma, uncomplicated: Secondary | ICD-10-CM

## 2022-10-14 DIAGNOSIS — M542 Cervicalgia: Secondary | ICD-10-CM

## 2022-10-14 MED ORDER — ALBUTEROL SULFATE HFA 108 (90 BASE) MCG/ACT IN AERS: 2.0000 | INHALATION_SPRAY | Freq: Four times a day (QID) | RESPIRATORY_TRACT | 1 refills | Status: AC | PRN

## 2022-10-14 MED ORDER — MELOXICAM 7.5 MG PO TABS: 15.0000 mg | ORAL_TABLET | Freq: Every day | ORAL | 1 refills | Status: DC | PRN

## 2022-10-14 MED ORDER — CYCLOBENZAPRINE HCL 5 MG PO TABS: 5.0000 mg | ORAL_TABLET | Freq: Three times a day (TID) | ORAL | 0 refills | Status: DC | PRN

## 2022-10-14 NOTE — Telephone Encounter (Signed)
Spoke with pt   She derives temporary relief from mobic, flexeril. She is not drowsy on flexeril. She would like xray done.  Plan.   Increase flexeril from '5mg'$  qhs to '5mg'$  TID Increase mobic from 7.'5mg'$  QD to '15mg'$  daily She will call to sch xray

## 2022-10-14 NOTE — Addendum Note (Signed)
Addended by: Burnard Hawthorne on: 10/14/2022 12:09 PM   Modules accepted: Orders

## 2022-10-14 NOTE — Telephone Encounter (Signed)
Spoke to pt in regards to her message and she stated that she would like to get something else called into pharmacy as [place] . But she will make f/up appt upon returning. Also needs refill on her inhaler as well!

## 2022-10-14 NOTE — Telephone Encounter (Signed)
Neck pain is still bothering her she states the medication only helps momentarily then it comes right back.  Pt will also like a Rx refill for her Albuterol inhaler.

## 2022-10-17 ENCOUNTER — Other Ambulatory Visit: Payer: Self-pay

## 2022-10-17 ENCOUNTER — Other Ambulatory Visit: Payer: Self-pay | Admitting: Family

## 2022-10-17 DIAGNOSIS — M542 Cervicalgia: Secondary | ICD-10-CM

## 2022-10-17 MED ORDER — MELOXICAM 7.5 MG PO TABS: 15.0000 mg | ORAL_TABLET | Freq: Every day | ORAL | 1 refills | Status: DC | PRN

## 2022-10-17 MED ORDER — CYCLOBENZAPRINE HCL 5 MG PO TABS: 5.0000 mg | ORAL_TABLET | Freq: Three times a day (TID) | ORAL | 1 refills | Status: DC | PRN

## 2022-10-17 NOTE — Telephone Encounter (Signed)
Pt need refill meloxicam and cyclobenzaprine sent to [facility]

## 2022-10-27 ENCOUNTER — Telehealth: Payer: Self-pay | Admitting: Family

## 2022-10-27 DIAGNOSIS — M542 Cervicalgia: Secondary | ICD-10-CM

## 2022-10-27 NOTE — Telephone Encounter (Signed)
LVM for pt to call back to make appt to get her Xrays done. Orders are in already!

## 2022-10-27 NOTE — Telephone Encounter (Signed)
Call pt and sch xrays  I ordered dg cervical spine, and left shoulder It is two xrays  Advise her to have toya make her a cd if she would like; these would be used if she was referred to orthopedic

## 2022-10-27 NOTE — Telephone Encounter (Signed)
Patient is back and said she is ready to have her rt shoulder xray. No order in Chart

## 2022-10-28 ENCOUNTER — Other Ambulatory Visit: Payer: 59

## 2022-10-28 ENCOUNTER — Ambulatory Visit (INDEPENDENT_AMBULATORY_CARE_PROVIDER_SITE_OTHER): Payer: 59

## 2022-10-28 DIAGNOSIS — M542 Cervicalgia: Secondary | ICD-10-CM

## 2022-10-28 DIAGNOSIS — M25512 Pain in left shoulder: Secondary | ICD-10-CM | POA: Diagnosis not present

## 2022-10-29 ENCOUNTER — Other Ambulatory Visit: Payer: Self-pay | Admitting: Family

## 2022-10-29 DIAGNOSIS — M542 Cervicalgia: Secondary | ICD-10-CM

## 2022-10-31 DIAGNOSIS — H109 Unspecified conjunctivitis: Secondary | ICD-10-CM | POA: Diagnosis not present

## 2022-11-04 NOTE — Telephone Encounter (Signed)
Spoke to patient and she has had xrays done and has cd as well . Appt scheduled with rehab on 11/06/22.

## 2022-11-06 ENCOUNTER — Ambulatory Visit: Payer: 59 | Attending: Family

## 2022-11-06 ENCOUNTER — Telehealth: Payer: Self-pay

## 2022-11-06 DIAGNOSIS — M542 Cervicalgia: Secondary | ICD-10-CM | POA: Insufficient documentation

## 2022-11-06 NOTE — Telephone Encounter (Signed)
Patient states she needs a paper referral today in order to keep appointment with Sports Medicine at 4:30pm today.  Patient states she would like to speak with Jenate [place], [place].

## 2022-11-06 NOTE — Therapy (Signed)
[facility] [address], [place], [address] Phone: [phone]   Fax:  [phone]  Physical Therapy Evaluation  Patient Details  Name: Mary Haney MRN: [id number] Date of Birth: December 17, 1970 Referring Provider (PT): Burnard Hawthorne, FNP   Encounter Date: 11/06/2022   PT End of Session - 11/06/22 1635     Visit Number 1    Number of Visits 13    Date for PT Re-Evaluation 12/18/22    Authorization - Visit Number 1    Authorization - Number of Visits 35    PT Start Time 1093    PT Stop Time 1732    PT Time Calculation (min) 57 min    Activity Tolerance Patient tolerated treatment well    Behavior During Therapy [facility] for tasks assessed/performed             Past Medical History:  Diagnosis Date   Alcoholic pancreatitis    Asthma    Has albuterol inhaler   Diabetes mellitus without complication ([place])    Type 2 diagnosed 2013    Past Surgical History:  Procedure Laterality Date   COLONOSCOPY WITH PROPOFOL N/A 02/11/2018   Procedure: COLONOSCOPY WITH PROPOFOL;  Surgeon: Lin Landsman, MD;  Location: [facility] ENDOSCOPY;  Service: Gastroenterology;  Laterality: N/A;   NO PAST SURGERIES      There were no vitals filed for this visit.    Subjective Assessment - 11/06/22 1638     Subjective Neck : Pain located L lateral neck and upper trap area: 7/10 currently, 10/10 at worst for the past 3 weeks.    Pertinent History Neck pain. Pain located L lateral neck and upper trap area. Pain began a month ago, waking up one morning. Massage provided momentary relief. Got a new pillow which did not help. Pain worsend since onset. Feels more swelling L lateral neck, MD aware. R hand dominant.    Patient Stated Goals No pain. 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 Yes    Pain Score 7     Pain Location Neck    Pain Orientation Left    Pain Descriptors /  Indicators Sore;Burning    Pain Type Acute pain    Pain Onset More than a month ago    Pain Frequency Constant    Aggravating Factors  Looking up, slouching, turning her head to the L, looking down.    Pain Relieving Factors Hempvana cream, heating pad, tylenol.                [facility] PT Assessment - 11/06/22 1646       Assessment   Medical Diagnosis M54.2 (ICD-10-CM) - Neck pain    Referring Provider (PT) Burnard Hawthorne, FNP    Onset Date/Surgical Date 10/29/22    Hand Dominance Right    Prior Therapy No known PT for current condition      Precautions   Precaution Comments No known precautions      Restrictions   Other Position/Activity Restrictions No known restrictions      Balance Screen   Has the patient fallen in the past 6 months No    Has the patient had a decrease in activity level because of a fear of falling?  No    Is the patient reluctant to leave their home because of a fear of falling?  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     Posture/Postural Control   Posture Comments Slight R cervical lateral shift, B protracted shoulders, Movement crease around C5/C6 area      AROM   Overall AROM Comments B UE WFL all planes. L lateral neck pain with L functional ER (Able to reach R scapula)   B shoulder shrug: reproduced L lateral neck pain.   Cervical Flexion WFL with L lateral neck pain reproduction    Cervical Extension WFL with L lateral neck pain    Cervical - Right Side Bend WFL with L lateral neck stretch    Cervical - Left Side Bend WFL with slight L lateral neck symptoms    Cervical - Right Rotation 48    Cervical - Left Rotation 42   with L upper trap/C4 dermatome pain     Strength   Overall Strength Comments Lower trap: 4/5 R, 4/5 L; middle trap: 4-/5 R, 4-/5 L    Right Shoulder Flexion 4+/5    Right Shoulder ABduction 4+/5    Left Shoulder Flexion 5/5    Left Shoulder ABduction 4/5    Right  Elbow Flexion 4+/5    Right Elbow Extension 5/5    Left Elbow Flexion 4/5    Left Elbow Extension 5/5    Right Wrist Extension 4/5    Left Wrist Extension 4/5      Palpation   Palpation comment TTP L upper trap and distal L scalene muscles with reproduction of symptom.                        Objective measurements completed on examination: See above findings.           No latex allergies per pt.   Cyst like texture found when palpating at around the C6/C7 spinous processes. No pain, no TTP.  Pt was recommended to contact her PCP about it. Pt verbalized understanding.   R hand dominant  No L UE radiating symptoms, no unexplained changes in weight.  Pt sleeps mainly on her R side. Lately sleeps on her back propped up on 2 pillows (slight reclined) due to neck pain.     Therapeutic exercise L first rib stretch with strap 30 seconds x 3 with contralateral side bend Then with R and L cervical rotation 10x3 each with strap on L side  L levator scapula stretch 30 seconds 3x sitting.   No improvement.    Improved exercise technique, movement at target joints, use of target muscles after mod verbal, visual, tactile cues.     Response to treatment Decreased L lateral neck pain with treatment to decrease L scalene muscle tension.    Clinical impression Pt is a 51 year old female who came to physical therapy secondary to acute L lateral neck pain after waking up one morning. She also presents with altered posture, TTP L upper trap and L distal scalenes, B scapular weakness, reproduction of symptoms with shoulder shrug, and cervical AROM and difficulty performing tasks which involve looking up or turning her head. Pt will benefit from skilled physical therapy services to address the aforementioned deficits. Cyst like texture found when palpating at around the C6/C7 spinous processes. No pain, no TTP.  Pt was recommended to contact her PCP about it. Pt verbalized  understanding.            Access Code: [email or web address] URL: [email or web address] Date: 11/06/2022 Prepared  by: Joneen Boers  Exercises - First Rib Mobilization with Strap  - 3 x daily - 7 x weekly - 1 sets - 3 reps - 30 seconds hold    Then with R and L cervical rotation 10x3 each with strap on L side 3x per day            PT Education - 11/06/22 1805     Education Details ther-ex, HEP    Person(s) Educated Patient    Methods Explanation;Demonstration;Tactile cues;Verbal cues;Handout    Comprehension Verbalized understanding;Returned demonstration              PT Short Term Goals - 11/06/22 1757       PT SHORT TERM GOAL #1   Title Pt will be independent with her initial HEP to decrease pain, improve strength, function, and ability to turn her head as well as look up more comfortably.    Baseline Pt has started her initial HEP (11/06/2022)    Time 3    Period Weeks    Status New    Target Date 11/27/22               PT Long Term Goals - 11/06/22 1758       PT LONG TERM GOAL #1   Title Pt will have a decrease in neck pain to 3/10 or less at worst to promote ability to look up, as well as look around more comfortably.    Baseline 10/10 neck pain at worst for the past 3 weeks (11/06/2022)    Time 6    Period Weeks    Status New    Target Date 12/18/22      PT LONG TERM GOAL #2   Title Pt will improve L cervical rotation AROM to 55 degrees without reports of pain to promote ability to look around more comfortably.    Baseline 42 degrees L cervical rotation with reproduction of symptoms (11/06/2022)    Time 6    Period Weeks    Status New    Target Date 12/18/22      PT LONG TERM GOAL #3   Title Pt will improve B lower trap strength by at least 1/2 MMT grade to decrease B upper trap muscle tension and improve ability to turn her head more comfortably.    Baseline Lower trap: 4/5 R, 4/5 L (11/06/2022).    Time 6    Period Weeks     Status New    Target Date 12/18/22      PT LONG TERM GOAL #4   Title Pt will improve her neck FOTO score by at least 10 points as a demonstration of improved function.    Baseline Neck FOTO 44 (11/06/2022)    Time 6    Period Weeks    Status New    Target Date 12/18/22                    Plan - 11/06/22 1807     Clinical Impression Statement Pt is a 51 year old female who came to physical therapy secondary to acute L lateral neck pain after waking up one morning. She also presents with altered posture, TTP L upper trap and L distal scalenes, B scapular weakness, reproduction of symptoms with shoulder shrug, and cervical AROM and difficulty performing tasks which involve looking up or turning her head. Pt will benefit from skilled physical therapy services to address the aforementioned deficits. A cyst like texture was  found when palpating around the C6/C7 spinous process area. No pain, no TTP. Pt was recommended to contact her PCP about it. Pt verbalized understanding.    Personal Factors and Comorbidities Comorbidity 3+    Comorbidities Alcoholic pancreatitis, DM, asthma    Examination-Activity Limitations Other   looking up, and turning her head   Stability/Clinical Decision Making Evolving/Moderate complexity   Worsening pain per pt.   Clinical Decision Making Moderate    Rehab Potential Fair    PT Frequency 2x / week    PT Duration 6 weeks    PT Treatment/Interventions Therapeutic exercise;Therapeutic activities;Neuromuscular re-education;Patient/family education;Manual techniques;Dry needling;Electrical Stimulation;Iontophoresis '4mg'$ /ml Dexamethasone    PT Next Visit Plan anterior cervical, B lower trap strengthening, decrasing upper trap and scalene muscle tension, manual techniques, modalities PRN    PT Home Exercise Plan Medbridge Access Code: [email or web address]    Consulted and Agree with Plan of Care Patient             Patient will benefit from skilled therapeutic  intervention in order to improve the following deficits and impairments:  Pain, Postural dysfunction, Improper body mechanics, Decreased strength, Decreased range of motion  Visit Diagnosis: Cervicalgia - Plan: PT plan of care cert/re-cert     Problem List Patient Active Problem List   Diagnosis Date Noted   Right groin pain 06/04/2022   Proteinuria 07/10/2020   Abnormal mammogram 12/16/2019   Missed menses 09/14/2019   Left breast mass 05/16/2019   Acute wrist pain 05/16/2019   Acute pain of right shoulder 11/08/2018   Yeast infection 05/07/2018   Palpable thyroid 12/17/2017   BRBPR (bright red blood per rectum) 12/11/2017   Toenail deformity 12/11/2017   Vaginal bleeding 08/18/2017   Rectal bleeding 08/18/2017   Asthma 04/29/2016   Nicotine dependence 04/09/2015   Eczema 04/03/2015   Uncontrolled type 1 diabetes mellitus with hyperglycemia, with long-term current use of insulin ([place]) 07/20/2014   Neck pain 02/23/2014   Routine general medical examination at a health care facility 07/14/2013   Screening for breast cancer 07/14/2013   Joneen Boers PT, DPT  11/06/2022, 6:28 PM  [facility] [address] [place], [address] Phone: [phone]   Fax:  [phone]  Name: Mary Haney MRN: [id number] Date of Birth: [birth year]

## 2022-11-06 NOTE — Telephone Encounter (Signed)
Thanks Rasheedah! 

## 2022-11-07 DIAGNOSIS — H109 Unspecified conjunctivitis: Secondary | ICD-10-CM | POA: Diagnosis not present

## 2022-11-10 ENCOUNTER — Ambulatory Visit: Payer: 59

## 2022-11-26 ENCOUNTER — Other Ambulatory Visit: Payer: Self-pay | Admitting: Internal Medicine

## 2022-11-26 ENCOUNTER — Ambulatory Visit: Payer: 59 | Attending: Family

## 2022-11-26 ENCOUNTER — Other Ambulatory Visit: Payer: Self-pay | Admitting: Family

## 2022-11-26 DIAGNOSIS — M542 Cervicalgia: Secondary | ICD-10-CM | POA: Insufficient documentation

## 2022-11-26 DIAGNOSIS — E1065 Type 1 diabetes mellitus with hyperglycemia: Secondary | ICD-10-CM

## 2022-11-26 NOTE — Therapy (Signed)
OUTPATIENT PHYSICAL THERAPY TREATMENT NOTE   Patient Name: Mary Haney MRN: [id number] DOB:September 28, 1971, 51 y.o., female Today's Date: 11/26/2022  PCP: Burnard Hawthorne, FNP  REFERRING PROVIDER: Burnard Hawthorne, FNP   END OF SESSION:  PT End of Session - 11/26/22 1721     Visit Number 2    Number of Visits 13    Date for PT Re-Evaluation 12/18/22    Authorization - Number of Visits 35    PT Start Time 1721    PT Stop Time 1803    PT Time Calculation (min) 42 min    Activity Tolerance Patient tolerated treatment well    Behavior During Therapy [facility] for tasks assessed/performed             Past Medical History:  Diagnosis Date   Alcoholic pancreatitis    Asthma    Has albuterol inhaler   Diabetes mellitus without complication ([place])    Type 2 diagnosed 2013   Past Surgical History:  Procedure Laterality Date   COLONOSCOPY WITH PROPOFOL N/A 02/11/2018   Procedure: COLONOSCOPY WITH PROPOFOL;  Surgeon: Lin Landsman, MD;  Location: [facility] ENDOSCOPY;  Service: Gastroenterology;  Laterality: N/A;   NO PAST SURGERIES     Patient Active Problem List   Diagnosis Date Noted   Right groin pain 06/04/2022   Proteinuria 07/10/2020   Abnormal mammogram 12/16/2019   Missed menses 09/14/2019   Left breast mass 05/16/2019   Acute wrist pain 05/16/2019   Acute pain of right shoulder 11/08/2018   Yeast infection 05/07/2018   Palpable thyroid 12/17/2017   BRBPR (bright red blood per rectum) 12/11/2017   Toenail deformity 12/11/2017   Vaginal bleeding 08/18/2017   Rectal bleeding 08/18/2017   Asthma 04/29/2016   Nicotine dependence 04/09/2015   Eczema 04/03/2015   Uncontrolled type 1 diabetes mellitus with hyperglycemia, with long-term current use of insulin ([place]) 07/20/2014   Neck pain 02/23/2014   Routine general medical examination at a health care facility 07/14/2013   Screening for breast cancer 07/14/2013    REFERRING DIAG: M54.2 (ICD-10-CM) - Neck pain    THERAPY DIAG:  Cervicalgia  Rationale for Evaluation and Treatment Rehabilitation  PERTINENT HISTORY: Neck pain. Pain located L lateral neck and upper trap area. Pain began a month ago, waking up one morning. Massage provided momentary relief. Got a new pillow which did not help. Pain worsend since onset. Feels more swelling L lateral neck, MD aware. R hand dominant.   PRECAUTIONS: no known precautions  SUBJECTIVE:   SUBJECTIVE STATEMENT: L neck feels much better but it still hurts. The stretches help. Will schedule and appointment with her provider soon pertaining to the cyst like texture in the back of her neck.     PAIN:  Are you having pain? No pain but stiffness. Did not take any pain medicines.    TODAY'S TREATMENT:  DATE: 11/26/2022   No latex allergies per pt.     Therapeutic exercise  Standing B scapular retraction red band 10x3 with 5 second holds  Seated scapular depression isometrics, forearm on arm rest  L 10x5 seconds  R 10x5 seconds   Seated chin tucks 10x5 seconds for 2 sets  Seated R and L cervical rotation AAROM 5x each   Supine cervical rotation 10x2 each direction      Improved exercise technique, movement at target joints, use of target muscles after mod verbal, visual, tactile cues.     Manual therapy  Seated gentle STM lateral neck with contralateral cervical side bending to decrease fascial restrictions 10x3 R and L    Seated STM L upper trap      Response to treatment Pt tolerated session well without aggravation of symptoms. Decreased neck stiffness reported after session.      Clinical impression  Improved L lateral neck pain based on subjective reports. Decreased neck stiffness with cervical rotation with activation of anterior cervical muscles (supine cervical rotation with cervical nod  position) to help control movement. Pt will benefit from continued skilled physical therapy services to decrease pain, improve ROM, strength, and function.        PATIENT EDUCATION: Education details: there-ex, HEP Person educated: Patient Education method: Explanation, Demonstration, Tactile cues, Verbal cues, and Handouts Education comprehension: verbalized understanding and returned demonstration  HOME EXERCISE PROGRAM: Access Code: [email or web address] URL: [email or web address] Date: 11/06/2022 Prepared by: Joneen Boers   Exercises - First Rib Mobilization with Strap  - 3 x daily - 7 x weekly - 1 sets - 3 reps - 30 seconds hold                          Then with R and L cervical rotation 10x3 each with strap on L side 3x per day - Supine Cervical Rotation AROM on Pillow  - 1 x daily - 7 x weekly - 3 sets - 10 reps     PT Short Term Goals - 11/06/22 1757       PT SHORT TERM GOAL #1   Title Pt will be independent with her initial HEP to decrease pain, improve strength, function, and ability to turn her head as well as look up more comfortably.    Baseline Pt has started her initial HEP (11/06/2022)    Time 3    Period Weeks    Status New    Target Date 11/27/22              PT Long Term Goals - 11/06/22 1758       PT LONG TERM GOAL #1   Title Pt will have a decrease in neck pain to 3/10 or less at worst to promote ability to look up, as well as look around more comfortably.    Baseline 10/10 neck pain at worst for the past 3 weeks (11/06/2022)    Time 6    Period Weeks    Status New    Target Date 12/18/22      PT LONG TERM GOAL #2   Title Pt will improve L cervical rotation AROM to 55 degrees without reports of pain to promote ability to look around more comfortably.    Baseline 42 degrees L cervical rotation with reproduction of symptoms (11/06/2022)    Time 6    Period Weeks    Status New    Target Date 12/18/22  PT LONG TERM GOAL #3   Title  Pt will improve B lower trap strength by at least 1/2 MMT grade to decrease B upper trap muscle tension and improve ability to turn her head more comfortably.    Baseline Lower trap: 4/5 R, 4/5 L (11/06/2022).    Time 6    Period Weeks    Status New    Target Date 12/18/22      PT LONG TERM GOAL #4   Title Pt will improve her neck FOTO score by at least 10 points as a demonstration of improved function.    Baseline Neck FOTO 44 (11/06/2022)    Time 6    Period Weeks    Status New    Target Date 12/18/22              Plan - 11/26/22 1720     Clinical Impression Statement Improved L lateral neck pain based on subjective reports. Decreased neck stiffness with cervical rotation with activation of anterior cervical muscles (supine cervical rotation with cervical nod position) to help control movement. Pt will benefit from continued skilled physical therapy services to decrease pain, improve ROM, strength, and function.    Personal Factors and Comorbidities Comorbidity 3+    Comorbidities Alcoholic pancreatitis, DM, asthma    Examination-Activity Limitations Other   looking up, and turning her head   Stability/Clinical Decision Making Evolving/Moderate complexity   Worsening pain per pt.   Rehab Potential Fair    PT Frequency 2x / week    PT Duration 6 weeks    PT Treatment/Interventions Therapeutic exercise;Therapeutic activities;Neuromuscular re-education;Patient/family education;Manual techniques;Dry needling;Electrical Stimulation;Iontophoresis '4mg'$ /ml Dexamethasone    PT Next Visit Plan anterior cervical, B lower trap strengthening, decrasing upper trap and scalene muscle tension, manual techniques, modalities PRN    PT Home Exercise Plan Medbridge Access Code: [email or web address]    Consulted and Agree with Plan of Care Patient              Joneen Boers PT, DPT  11/26/2022, 6:20 PM

## 2022-12-03 ENCOUNTER — Ambulatory Visit: Payer: 59

## 2022-12-10 ENCOUNTER — Ambulatory Visit: Payer: 59 | Attending: Family

## 2022-12-10 DIAGNOSIS — M542 Cervicalgia: Secondary | ICD-10-CM | POA: Diagnosis not present

## 2022-12-10 NOTE — Therapy (Signed)
OUTPATIENT PHYSICAL THERAPY TREATMENT NOTE   Patient Name: Mary Haney MRN: [id number] DOB:13-May-1971, 52 y.o., female Today's Date: 12/10/2022  PCP: Burnard Hawthorne, FNP  REFERRING PROVIDER: Burnard Hawthorne, FNP   END OF SESSION:  PT End of Session - 12/10/22 1632     Visit Number 3    Number of Visits 13    Date for PT Re-Evaluation 12/18/22    Authorization - Number of Visits 35    PT Start Time 1632    PT Stop Time 1716    PT Time Calculation (min) 44 min    Activity Tolerance Patient tolerated treatment well    Behavior During Therapy [facility] for tasks assessed/performed              Past Medical History:  Diagnosis Date   Alcoholic pancreatitis    Asthma    Has albuterol inhaler   Diabetes mellitus without complication ([place])    Type 2 diagnosed 2013   Past Surgical History:  Procedure Laterality Date   COLONOSCOPY WITH PROPOFOL N/A 02/11/2018   Procedure: COLONOSCOPY WITH PROPOFOL;  Surgeon: Lin Landsman, MD;  Location: [facility] ENDOSCOPY;  Service: Gastroenterology;  Laterality: N/A;   NO PAST SURGERIES     Patient Active Problem List   Diagnosis Date Noted   Right groin pain 06/04/2022   Proteinuria 07/10/2020   Abnormal mammogram 12/16/2019   Missed menses 09/14/2019   Left breast mass 05/16/2019   Acute wrist pain 05/16/2019   Acute pain of right shoulder 11/08/2018   Yeast infection 05/07/2018   Palpable thyroid 12/17/2017   BRBPR (bright red blood per rectum) 12/11/2017   Toenail deformity 12/11/2017   Vaginal bleeding 08/18/2017   Rectal bleeding 08/18/2017   Asthma 04/29/2016   Nicotine dependence 04/09/2015   Eczema 04/03/2015   Uncontrolled type 1 diabetes mellitus with hyperglycemia, with long-term current use of insulin ([place]) 07/20/2014   Neck pain 02/23/2014   Routine general medical examination at a health care facility 07/14/2013   Screening for breast cancer 07/14/2013    REFERRING DIAG: M54.2 (ICD-10-CM) - Neck pain    THERAPY DIAG:  Cervicalgia  Rationale for Evaluation and Treatment Rehabilitation  PERTINENT HISTORY: Neck pain. Pain located L lateral neck and upper trap area. Pain began a month ago, waking up one morning. Massage provided momentary relief. Got a new pillow which did not help. Pain worsend since onset. Feels more swelling L lateral neck, MD aware. R hand dominant.   PRECAUTIONS: no known precautions  SUBJECTIVE:   SUBJECTIVE STATEMENT: L neck is a little better. No pain currently.   PAIN:  Are you having pain? No pain but stiffness. Did not take any pain medicines.    TODAY'S TREATMENT:  DATE: 12/10/2022   No latex allergies per pt.     Therapeutic exercise  Supine cervical rotation (with cervical nod position) 10x2 each direction  Supine B cervical nod 10x3 with 5 second holds  Seated scapular depression isometrics, forearm on arm rest  L 10x5 seconds  R 10x5 seconds    L lateral neck soreness afterwards   Seated chin tucks 10x5 seconds for 2 sets  Standing B scapular retraction red band 10x3 with 5 second holds  L SCM muscle stretch 30 seconds x 3    Improved exercise technique, movement at target joints, use of target muscles after mod verbal, visual, tactile cues.     Manual therapy   Seated gentle STM lateral neck with contralateral cervical side bending to decrease fascial restrictions 10x2 R and L    Seated STM L upper trap to decrease muscle tension        Response to treatment  Fair tolerance to today's session     Clinical impression  Continued working on anterior cervical, as well as scapular muscle strengthening to promote better mechanics with cervical rotation. Fair tolerance to today's session. L lateral neck soreness along the SCM muscle after L scapular depression isometrics exercise. Pt will  benefit from continued skilled physical therapy services to decrease pain, improve ROM, strength, and function.        PATIENT EDUCATION: Education details: there-ex, HEP Person educated: Patient Education method: Explanation, Demonstration, Tactile cues, Verbal cues, and Handouts Education comprehension: verbalized understanding and returned demonstration  HOME EXERCISE PROGRAM: Access Code: [email or web address] URL: [email or web address] Date: 11/06/2022 Prepared by: Joneen Boers   Exercises - First Rib Mobilization with Strap  - 3 x daily - 7 x weekly - 1 sets - 3 reps - 30 seconds hold                          Then with R and L cervical rotation 10x3 each with strap on L side 3x per day - Supine Cervical Rotation AROM on Pillow  - 1 x daily - 7 x weekly - 3 sets - 10 reps     PT Short Term Goals - 11/06/22 1757       PT SHORT TERM GOAL #1   Title Pt will be independent with her initial HEP to decrease pain, improve strength, function, and ability to turn her head as well as look up more comfortably.    Baseline Pt has started her initial HEP (11/06/2022)    Time 3    Period Weeks    Status New    Target Date 11/27/22              PT Long Term Goals - 11/06/22 1758       PT LONG TERM GOAL #1   Title Pt will have a decrease in neck pain to 3/10 or less at worst to promote ability to look up, as well as look around more comfortably.    Baseline 10/10 neck pain at worst for the past 3 weeks (11/06/2022)    Time 6    Period Weeks    Status New    Target Date 12/18/22      PT LONG TERM GOAL #2   Title Pt will improve L cervical rotation AROM to 55 degrees without reports of pain to promote ability to look around more comfortably.    Baseline 42 degrees L cervical rotation with reproduction of symptoms (11/06/2022)  Time 6    Period Weeks    Status New    Target Date 12/18/22      PT LONG TERM GOAL #3   Title Pt will improve B lower trap strength by at  least 1/2 MMT grade to decrease B upper trap muscle tension and improve ability to turn her head more comfortably.    Baseline Lower trap: 4/5 R, 4/5 L (11/06/2022).    Time 6    Period Weeks    Status New    Target Date 12/18/22      PT LONG TERM GOAL #4   Title Pt will improve her neck FOTO score by at least 10 points as a demonstration of improved function.    Baseline Neck FOTO 44 (11/06/2022)    Time 6    Period Weeks    Status New    Target Date 12/18/22              Plan - 12/10/22 1632     Clinical Impression Statement Continued working on anterior cervical, as well as scapular muscle strengthening to promote better mechanics with cervical rotation. Fair tolerance to today's session. L lateral neck soreness along the SCM muscle after L scapular depression isometrics exercise. Pt will benefit from continued skilled physical therapy services to decrease pain, improve ROM, strength, and function.    Personal Factors and Comorbidities Comorbidity 3+    Comorbidities Alcoholic pancreatitis, DM, asthma    Examination-Activity Limitations Other   looking up, and turning her head   Stability/Clinical Decision Making Evolving/Moderate complexity   Worsening pain per pt.   Rehab Potential Fair    PT Frequency 2x / week    PT Duration 6 weeks    PT Treatment/Interventions Therapeutic exercise;Therapeutic activities;Neuromuscular re-education;Patient/family education;Manual techniques;Dry needling;Electrical Stimulation;Iontophoresis '4mg'$ /ml Dexamethasone    PT Next Visit Plan anterior cervical, B lower trap strengthening, decrasing upper trap and scalene muscle tension, manual techniques, modalities PRN    PT Home Exercise Plan Medbridge Access Code: [email or web address]    Consulted and Agree with Plan of Care Patient               Joneen Boers PT, DPT  12/10/2022, 6:26 PM

## 2022-12-11 ENCOUNTER — Other Ambulatory Visit: Payer: Self-pay | Admitting: Internal Medicine

## 2022-12-11 DIAGNOSIS — E1065 Type 1 diabetes mellitus with hyperglycemia: Secondary | ICD-10-CM

## 2022-12-15 ENCOUNTER — Ambulatory Visit: Payer: 59

## 2022-12-15 DIAGNOSIS — M542 Cervicalgia: Secondary | ICD-10-CM

## 2022-12-15 NOTE — Therapy (Signed)
OUTPATIENT PHYSICAL THERAPY TREATMENT NOTE   Patient Name: Mary Haney MRN: [id number] DOB:1971/05/05, 52 y.o., female Today's Date: 12/15/2022  PCP: Burnard Hawthorne, FNP  REFERRING PROVIDER: Burnard Hawthorne, FNP   END OF SESSION:  PT End of Session - 12/15/22 1715     Visit Number 4    Number of Visits 13    Date for PT Re-Evaluation 12/18/22    Authorization - Number of Visits 35    PT Start Time 1715    PT Stop Time 1756    PT Time Calculation (min) 41 min    Activity Tolerance Patient tolerated treatment well    Behavior During Therapy [facility] for tasks assessed/performed               Past Medical History:  Diagnosis Date   Alcoholic pancreatitis    Asthma    Has albuterol inhaler   Diabetes mellitus without complication ([place])    Type 2 diagnosed 2013   Past Surgical History:  Procedure Laterality Date   COLONOSCOPY WITH PROPOFOL N/A 02/11/2018   Procedure: COLONOSCOPY WITH PROPOFOL;  Surgeon: Lin Landsman, MD;  Location: [facility] ENDOSCOPY;  Service: Gastroenterology;  Laterality: N/A;   NO PAST SURGERIES     Patient Active Problem List   Diagnosis Date Noted   Right groin pain 06/04/2022   Proteinuria 07/10/2020   Abnormal mammogram 12/16/2019   Missed menses 09/14/2019   Left breast mass 05/16/2019   Acute wrist pain 05/16/2019   Acute pain of right shoulder 11/08/2018   Yeast infection 05/07/2018   Palpable thyroid 12/17/2017   BRBPR (bright red blood per rectum) 12/11/2017   Toenail deformity 12/11/2017   Vaginal bleeding 08/18/2017   Rectal bleeding 08/18/2017   Asthma 04/29/2016   Nicotine dependence 04/09/2015   Eczema 04/03/2015   Uncontrolled type 1 diabetes mellitus with hyperglycemia, with long-term current use of insulin ([place]) 07/20/2014   Neck pain 02/23/2014   Routine general medical examination at a health care facility 07/14/2013   Screening for breast cancer 07/14/2013    REFERRING DIAG: M54.2 (ICD-10-CM) - Neck pain    THERAPY DIAG:  Cervicalgia  Rationale for Evaluation and Treatment Rehabilitation  PERTINENT HISTORY: Neck pain. Pain located L lateral neck and upper trap area. Pain began a month ago, waking up one morning. Massage provided momentary relief. Got a new pillow which did not help. Pain worsend since onset. Feels more swelling L lateral neck, MD aware. R hand dominant.   PRECAUTIONS: no known precautions  SUBJECTIVE:   SUBJECTIVE STATEMENT: L neck is a little sore. Thinking about getting a new mattress. No neck pain currently, just tight.   PAIN:  Are you having pain?     TODAY'S TREATMENT:  DATE: 12/15/2022   No latex allergies per pt.   Therapeutic exercise  Standing B shoulder extension yellow band 10x5 seconds for 2 sets  Seated manually resisted scapular retraction targeting lower trap muscle  L 10x5 seconds   Decreased L upper trap muscle soreness afterwards  Seated manually resisted cervical flexion isometrics in neutral, thumbs under chin 10x5 seconds  Increased L posterior lateral neck tightness afterwards.   L SCM muscle stretch 30 seconds x 3  Standing L shoulder flexion with proper scapular position 10x    Improved exercise technique, movement at target joints, use of target muscles after mod verbal, visual, tactile cues.       Manual therapy  Seated gentle STM L > R  posterior neck and upper trap muscles to decrease tension   Seated caudal glide L first rib grade 3 to promote mobility  Decreased neck stiffness reported afterwards.       Response to treatment Decreased L neck tightness after caudal glide to L first rib and treatment to decrease L upper trap muscle tension      Clinical impression  Decreased L neck tightness after caudal glide to L first rib and treatment to decrease L upper trap muscle  tension. Also worked on L shoulder flexion with proper scapular positioning so as to decrease over activation of L scalene and upper trap muscles. Pt will benefit from continued skilled physical therapy services to decrease pain, improve ROM, strength, and function.        PATIENT EDUCATION: Education details: there-ex, HEP Person educated: Patient Education method: Explanation, Demonstration, Tactile cues, Verbal cues, and Handouts Education comprehension: verbalized understanding and returned demonstration  HOME EXERCISE PROGRAM: Access Code: [email or web address] URL: [email or web address] Date: 11/06/2022 Prepared by: Joneen Boers   Exercises - First Rib Mobilization with Strap  - 3 x daily - 7 x weekly - 1 sets - 3 reps - 30 seconds hold                          Then with R and L cervical rotation 10x3 each with strap on L side 3x per day - Supine Cervical Rotation AROM on Pillow  - 1 x daily - 7 x weekly - 3 sets - 10 reps     PT Short Term Goals - 11/06/22 1757       PT SHORT TERM GOAL #1   Title Pt will be independent with her initial HEP to decrease pain, improve strength, function, and ability to turn her head as well as look up more comfortably.    Baseline Pt has started her initial HEP (11/06/2022)    Time 3    Period Weeks    Status New    Target Date 11/27/22              PT Long Term Goals - 11/06/22 1758       PT LONG TERM GOAL #1   Title Pt will have a decrease in neck pain to 3/10 or less at worst to promote ability to look up, as well as look around more comfortably.    Baseline 10/10 neck pain at worst for the past 3 weeks (11/06/2022)    Time 6    Period Weeks    Status New    Target Date 12/18/22      PT LONG TERM GOAL #2   Title Pt will improve L cervical rotation AROM to 55 degrees without reports of pain  to promote ability to look around more comfortably.    Baseline 42 degrees L cervical rotation with reproduction of symptoms  (11/06/2022)    Time 6    Period Weeks    Status New    Target Date 12/18/22      PT LONG TERM GOAL #3   Title Pt will improve B lower trap strength by at least 1/2 MMT grade to decrease B upper trap muscle tension and improve ability to turn her head more comfortably.    Baseline Lower trap: 4/5 R, 4/5 L (11/06/2022).    Time 6    Period Weeks    Status New    Target Date 12/18/22      PT LONG TERM GOAL #4   Title Pt will improve her neck FOTO score by at least 10 points as a demonstration of improved function.    Baseline Neck FOTO 44 (11/06/2022)    Time 6    Period Weeks    Status New    Target Date 12/18/22              Plan - 12/15/22 1713     Clinical Impression Statement Decreased L neck tightness after caudal glide to L first rib and treatment to decrease L upper trap muscle tension. Also worked on L shoulder flexion with proper scapular positioning so as to decrease over activation of L scalene and upper trap muscles. Pt will benefit from continued skilled physical therapy services to decrease pain, improve ROM, strength, and function.    Personal Factors and Comorbidities Comorbidity 3+    Comorbidities Alcoholic pancreatitis, DM, asthma    Examination-Activity Limitations Other   looking up, and turning her head   Stability/Clinical Decision Making Evolving/Moderate complexity   Worsening pain per pt.   Clinical Decision Making Low    Rehab Potential Fair    PT Frequency 2x / week    PT Duration 6 weeks    PT Treatment/Interventions Therapeutic exercise;Therapeutic activities;Neuromuscular re-education;Patient/family education;Manual techniques;Dry needling;Electrical Stimulation;Iontophoresis '4mg'$ /ml Dexamethasone    PT Next Visit Plan anterior cervical, B lower trap strengthening, decrasing upper trap and scalene muscle tension, manual techniques, modalities PRN    PT Home Exercise Plan Medbridge Access Code: [email or web address]    Consulted and Agree with Plan of Care  Patient               Joneen Boers PT, DPT  12/15/2022, 6:03 PM

## 2022-12-17 ENCOUNTER — Other Ambulatory Visit: Payer: Self-pay | Admitting: Family

## 2022-12-17 ENCOUNTER — Ambulatory Visit: Payer: 59

## 2022-12-17 DIAGNOSIS — M542 Cervicalgia: Secondary | ICD-10-CM

## 2022-12-17 NOTE — Therapy (Signed)
OUTPATIENT PHYSICAL THERAPY TREATMENT NOTE   Patient Name: Mary Haney MRN: [id number] DOB:11-24-1971, 52 y.o., female Today's Date: 12/17/2022  PCP: Burnard Hawthorne, FNP  REFERRING PROVIDER: Burnard Hawthorne, FNP   END OF SESSION:  PT End of Session - 12/17/22 1635     Visit Number 5    Number of Visits 19    Date for PT Re-Evaluation 01/08/23    Authorization - Number of Visits 35    PT Start Time 1635    PT Stop Time 1716    PT Time Calculation (min) 41 min    Activity Tolerance Patient tolerated treatment well    Behavior During Therapy [facility] for tasks assessed/performed                Past Medical History:  Diagnosis Date   Alcoholic pancreatitis    Asthma    Has albuterol inhaler   Diabetes mellitus without complication ([place])    Type 2 diagnosed 2013   Past Surgical History:  Procedure Laterality Date   COLONOSCOPY WITH PROPOFOL N/A 02/11/2018   Procedure: COLONOSCOPY WITH PROPOFOL;  Surgeon: Lin Landsman, MD;  Location: [facility] ENDOSCOPY;  Service: Gastroenterology;  Laterality: N/A;   NO PAST SURGERIES     Patient Active Problem List   Diagnosis Date Noted   Right groin pain 06/04/2022   Proteinuria 07/10/2020   Abnormal mammogram 12/16/2019   Missed menses 09/14/2019   Left breast mass 05/16/2019   Acute wrist pain 05/16/2019   Acute pain of right shoulder 11/08/2018   Yeast infection 05/07/2018   Palpable thyroid 12/17/2017   BRBPR (bright red blood per rectum) 12/11/2017   Toenail deformity 12/11/2017   Vaginal bleeding 08/18/2017   Rectal bleeding 08/18/2017   Asthma 04/29/2016   Nicotine dependence 04/09/2015   Eczema 04/03/2015   Uncontrolled type 1 diabetes mellitus with hyperglycemia, with long-term current use of insulin ([place]) 07/20/2014   Neck pain 02/23/2014   Routine general medical examination at a health care facility 07/14/2013   Screening for breast cancer 07/14/2013    REFERRING DIAG: M54.2 (ICD-10-CM) - Neck  pain   THERAPY DIAG:  Cervicalgia - Plan: PT plan of care cert/re-cert  Rationale for Evaluation and Treatment Rehabilitation  PERTINENT HISTORY: Neck pain. Pain located L lateral neck and upper trap area. Pain began a month ago, waking up one morning. Massage provided momentary relief. Got a new pillow which did not help. Pain worsend since onset. Feels more swelling L lateral neck, MD aware. R hand dominant.   PRECAUTIONS: no known precautions  SUBJECTIVE:   SUBJECTIVE STATEMENT: L neck is ok. Better today but could not sleep after last session. Had to prop her head up. Neck was stiff. No pain currently but stiff.   PAIN:  Are you having pain?     TODAY'S TREATMENT:  DATE: 12/17/2022   No latex allergies per pt.   Therapeutic exercise  Seated cervical AROM to the L and R 1x each  Prone lower trap raise   R 10x2  L 10x2  Standing first rib stretch  With contralateral cervical side bend   L  30 seconds x 3   With cervical rotation    L 10x3   R 10x3    Cervical rotation AROM  afterwards   L 50 degrees   R 50 degrees    Improved exercise technique, movement at target joints, use of target muscles after mod verbal, visual, tactile cues.       Manual therapy  Seated gentle STM L and R  posterior neck and upper trap muscles to decrease tension      Response to treatment Pt tolerated session well without aggravation of symptoms.     Clinical impression   Pt demonstrates a 3 point decrease in worst neck pain levels as well as improved function since initial evaluation. Cervical rotation AROM measured at the beginning of the session is similar to initial eval range. Cervical AROM however improves after treatment to decrease L scalene muscle tension. Pt making progress with PT towards goals. Pt will benefit from continued  skilled physical therapy services to continue to decrease pain, muscle tension as well as improve cervical AROM and function.        PATIENT EDUCATION: Education details: there-ex, HEP Person educated: Patient Education method: Explanation, Demonstration, Tactile cues, Verbal cues, and Handouts Education comprehension: verbalized understanding and returned demonstration  HOME EXERCISE PROGRAM: Access Code: [email or web address] URL: [email or web address] Date: 11/06/2022 Prepared by: Joneen Boers   Exercises - First Rib Mobilization with Strap  - 3 x daily - 7 x weekly - 1 sets - 3 reps - 30 seconds hold                          Then with R and L cervical rotation 10x3 each with strap on L side 3x per day - Supine Cervical Rotation AROM on Pillow  - 1 x daily - 7 x weekly - 3 sets - 10 reps - Prone Single Arm Shoulder Y  - 1 x daily - 7 x weekly - 2 sets - 10 reps    PT Short Term Goals - 12/17/22 1637       PT SHORT TERM GOAL #1   Title Pt will be independent with her initial HEP to decrease pain, improve strength, function, and ability to turn her head as well as look up more comfortably.    Baseline Pt has started her initial HEP (11/06/2022); Pt performing her HEP (12/17/2021)    Time 3    Period Weeks    Status Partially Met    Target Date 01/07/23              PT Long Term Goals - 12/17/22 1638       PT LONG TERM GOAL #1   Title Pt will have a decrease in neck pain to 3/10 or less at worst to promote ability to look up, as well as look around more comfortably.    Baseline 10/10 neck pain at worst for the past 3 weeks (11/06/2022); 7/10 at most for the past 7 days (12/17/2022)    Time 3    Period Weeks    Status Partially Met    Target Date 01/08/23      PT LONG TERM  GOAL #2   Title Pt will improve L cervical rotation AROM to 55 degrees without reports of pain to promote ability to look around more comfortably.    Baseline 42 degrees L cervical rotation  with reproduction of symptoms (11/06/2022); 48 degrees to the L, 45 degrees to the R (12/17/2022)    Time 3    Period Weeks    Status Partially Met    Target Date 01/08/23      PT LONG TERM GOAL #3   Title Pt will improve B lower trap strength by at least 1/2 MMT grade to decrease B upper trap muscle tension and improve ability to turn her head more comfortably.    Baseline Lower trap: 4/5 R, 4/5 L (11/06/2022).; 4/5 R and L  (12/17/2022)    Time 3    Period Weeks    Status On-going    Target Date 01/08/23      PT LONG TERM GOAL #4   Title Pt will improve her neck FOTO score by at least 10 points as a demonstration of improved function.    Baseline Neck FOTO 44 (11/06/2022); 59 (12/17/2022)    Time 6    Period Weeks    Status Achieved    Target Date 12/18/22              Plan - 12/17/22 1635     Clinical Impression Statement Pt demonstrates a 3 point decrease in worst neck pain levels as well as improved function since initial evaluation. Cervical rotation AROM measured at the beginning of the session is similar to initial eval range. Cervical AROM however improves after treatment to decrease L scalene muscle tension. Pt making progress with PT towards goals. Pt will benefit from continued skilled physical therapy services to continue to decrease pain, muscle tension as well as improve cervical AROM and function.    Personal Factors and Comorbidities Comorbidity 3+    Comorbidities Alcoholic pancreatitis, DM, asthma    Examination-Activity Limitations Other   looking up, and turning her head   Stability/Clinical Decision Making Stable/Uncomplicated   Worsening pain per pt.   Clinical Decision Making Low    Rehab Potential Fair    PT Frequency 2x / week    PT Duration 3 weeks    PT Treatment/Interventions Therapeutic exercise;Therapeutic activities;Neuromuscular re-education;Patient/family education;Manual techniques;Dry needling;Electrical Stimulation;Iontophoresis '4mg'$ /ml  Dexamethasone    PT Next Visit Plan anterior cervical, B lower trap strengthening, decrasing upper trap and scalene muscle tension, manual techniques, modalities PRN    PT Home Exercise Plan Medbridge Access Code: [email or web address]    Consulted and Agree with Plan of Care Patient               Joneen Boers PT, DPT  12/17/2022, 6:54 PM

## 2022-12-22 ENCOUNTER — Ambulatory Visit: Payer: 59

## 2022-12-22 ENCOUNTER — Ambulatory Visit: Payer: Self-pay | Admitting: Internal Medicine

## 2022-12-24 ENCOUNTER — Ambulatory Visit: Payer: 59

## 2022-12-29 ENCOUNTER — Ambulatory Visit: Payer: 59

## 2022-12-29 DIAGNOSIS — M542 Cervicalgia: Secondary | ICD-10-CM

## 2022-12-29 NOTE — Therapy (Signed)
OUTPATIENT PHYSICAL THERAPY TREATMENT NOTE   Patient Name: Mary Haney MRN: [id number] DOB:[birth year], 52 y.o., female Today's Date: 12/29/2022  PCP: Burnard Hawthorne, FNP  REFERRING PROVIDER: Burnard Hawthorne, FNP   END OF SESSION:  PT End of Session - 12/29/22 1639     Visit Number 6    Number of Visits 19    Date for PT Re-Evaluation 01/08/23    Authorization - Number of Visits 35    PT Start Time 1639    PT Stop Time 1720    PT Time Calculation (min) 41 min    Activity Tolerance Patient tolerated treatment well    Behavior During Therapy [facility] for tasks assessed/performed                 Past Medical History:  Diagnosis Date   Alcoholic pancreatitis    Asthma    Has albuterol inhaler   Diabetes mellitus without complication ([place])    Type 2 diagnosed 2013   Past Surgical History:  Procedure Laterality Date   COLONOSCOPY WITH PROPOFOL N/A 02/11/2018   Procedure: COLONOSCOPY WITH PROPOFOL;  Surgeon: Lin Landsman, MD;  Location: [facility] ENDOSCOPY;  Service: Gastroenterology;  Laterality: N/A;   NO PAST SURGERIES     Patient Active Problem List   Diagnosis Date Noted   Right groin pain 06/04/2022   Proteinuria 07/10/2020   Abnormal mammogram 12/16/2019   Missed menses 09/14/2019   Left breast mass 05/16/2019   Acute wrist pain 05/16/2019   Acute pain of right shoulder 11/08/2018   Yeast infection 05/07/2018   Palpable thyroid 12/17/2017   BRBPR (bright red blood per rectum) 12/11/2017   Toenail deformity 12/11/2017   Vaginal bleeding 08/18/2017   Rectal bleeding 08/18/2017   Asthma 04/29/2016   Nicotine dependence 04/09/2015   Eczema 04/03/2015   Uncontrolled type 1 diabetes mellitus with hyperglycemia, with long-term current use of insulin ([place]) 07/20/2014   Neck pain 02/23/2014   Routine general medical examination at a health care facility 07/14/2013   Screening for breast cancer 07/14/2013    REFERRING DIAG: M54.2 (ICD-10-CM) - Neck  pain   THERAPY DIAG:  Cervicalgia  Rationale for Evaluation and Treatment Rehabilitation  PERTINENT HISTORY: Neck pain. Pain located L lateral neck and upper trap area. Pain began a month ago, waking up one morning. Massage provided momentary relief. Got a new pillow which did not help. Pain worsend since onset. Feels more swelling L lateral neck, MD aware. R hand dominant.   PRECAUTIONS: no known precautions  SUBJECTIVE:   SUBJECTIVE STATEMENT: L neck is good. Stiffness but no pain. Has not taken pain medicine for the past few days.     PAIN:  Are you having pain?     TODAY'S TREATMENT:  DATE: 12/29/2022   No latex allergies per pt.   Therapeutic exercise  Quadruped cervical rotation with chin tuck position 10x each direction  Uncomfortable reported afterwards while performing.   Prone lower trap raise   R 10x2   L 10x2  Quadruped chin tucks 10x5 seconds for 3 sets  Lat pull down with B scapular retraction 10x5 seconds for 3 sets at plate 10  Standing chin tucks with head on folded towel at wall 10x5 seconds for 3 sets     Improved exercise technique, movement at target joints, use of target muscles after mod verbal, visual, tactile cues.         Response to treatment Pt tolerated session well without aggravation of symptoms.     Clinical impression  Worked on improving cervical and scapular posture and lower trap strength to help decrease B upper trap and scalene muscle tension. Pt tolerated session well without aggravation of symptoms. Pt will benefit from continued skilled physical therapy services to continue to decrease pain, muscle tension as well as improve cervical AROM and function.        PATIENT EDUCATION: Education details: there-ex, HEP Person educated: Patient Education method: Explanation,  Demonstration, Tactile cues, Verbal cues, and Handouts Education comprehension: verbalized understanding and returned demonstration  HOME EXERCISE PROGRAM: Access Code: [email or web address] URL: [email or web address] Date: 11/06/2022 Prepared by: Joneen Boers   Exercises - First Rib Mobilization with Strap  - 3 x daily - 7 x weekly - 1 sets - 3 reps - 30 seconds hold                          Then with R and L cervical rotation 10x3 each with strap on L side 3x per day - Supine Cervical Rotation AROM on Pillow  - 1 x daily - 7 x weekly - 3 sets - 10 reps - Prone Single Arm Shoulder Y  - 1 x daily - 7 x weekly - 2 sets - 10 reps    PT Short Term Goals - 12/17/22 1637       PT SHORT TERM GOAL #1   Title Pt will be independent with her initial HEP to decrease pain, improve strength, function, and ability to turn her head as well as look up more comfortably.    Baseline Pt has started her initial HEP (11/06/2022); Pt performing her HEP (12/17/2021)    Time 3    Period Weeks    Status Partially Met    Target Date 01/07/23              PT Long Term Goals - 12/17/22 1638       PT LONG TERM GOAL #1   Title Pt will have a decrease in neck pain to 3/10 or less at worst to promote ability to look up, as well as look around more comfortably.    Baseline 10/10 neck pain at worst for the past 3 weeks (11/06/2022); 7/10 at most for the past 7 days (12/17/2022)    Time 3    Period Weeks    Status Partially Met    Target Date 01/08/23      PT LONG TERM GOAL #2   Title Pt will improve L cervical rotation AROM to 55 degrees without reports of pain to promote ability to look around more comfortably.    Baseline 42 degrees L cervical rotation with reproduction of symptoms (11/06/2022); 48 degrees to the L, 45 degrees to  the R (12/17/2022)    Time 3    Period Weeks    Status Partially Met    Target Date 01/08/23      PT LONG TERM GOAL #3   Title Pt will improve B lower trap strength by  at least 1/2 MMT grade to decrease B upper trap muscle tension and improve ability to turn her head more comfortably.    Baseline Lower trap: 4/5 R, 4/5 L (11/06/2022).; 4/5 R and L  (12/17/2022)    Time 3    Period Weeks    Status On-going    Target Date 01/08/23      PT LONG TERM GOAL #4   Title Pt will improve her neck FOTO score by at least 10 points as a demonstration of improved function.    Baseline Neck FOTO 44 (11/06/2022); 59 (12/17/2022)    Time 6    Period Weeks    Status Achieved    Target Date 12/18/22              Plan - 12/29/22 1639     Clinical Impression Statement Worked on improving cervical and scapular posture and lower trap strength to help decrease B upper trap and scalene muscle tension. Pt tolerated session well without aggravation of symptoms. Pt will benefit from continued skilled physical therapy services to continue to decrease pain, muscle tension as well as improve cervical AROM and function.    Personal Factors and Comorbidities Comorbidity 3+    Comorbidities Alcoholic pancreatitis, DM, asthma    Examination-Activity Limitations Other   looking up, and turning her head   Stability/Clinical Decision Making Stable/Uncomplicated   Worsening pain per pt.   Clinical Decision Making Low    Rehab Potential Fair    PT Frequency 2x / week    PT Duration 3 weeks    PT Treatment/Interventions Therapeutic exercise;Therapeutic activities;Neuromuscular re-education;Patient/family education;Manual techniques;Dry needling;Electrical Stimulation;Iontophoresis '4mg'$ /ml Dexamethasone    PT Next Visit Plan anterior cervical, B lower trap strengthening, decrasing upper trap and scalene muscle tension, manual techniques, modalities PRN    PT Home Exercise Plan Medbridge Access Code: [email or web address]    Consulted and Agree with Plan of Care Patient               Joneen Boers PT, DPT  12/29/2022, 5:26 PM

## 2022-12-31 ENCOUNTER — Ambulatory Visit: Payer: 59

## 2022-12-31 DIAGNOSIS — M542 Cervicalgia: Secondary | ICD-10-CM | POA: Diagnosis not present

## 2022-12-31 NOTE — Therapy (Signed)
OUTPATIENT PHYSICAL THERAPY TREATMENT NOTE   Patient Name: Mary Haney MRN: [id number] DOB:09-05-1971, 52 y.o., female Today's Date: 12/31/2022  PCP: Burnard Hawthorne, FNP  REFERRING PROVIDER: Burnard Hawthorne, FNP   END OF SESSION:  PT End of Session - 12/31/22 1717     Visit Number 7    Number of Visits 19    Date for PT Re-Evaluation 01/08/23    Authorization - Number of Visits 35    PT Start Time 1717    PT Stop Time 1801    PT Time Calculation (min) 44 min    Activity Tolerance Patient tolerated treatment well    Behavior During Therapy [facility] for tasks assessed/performed                  Past Medical History:  Diagnosis Date   Alcoholic pancreatitis    Asthma    Has albuterol inhaler   Diabetes mellitus without complication ([place])    Type 2 diagnosed 2013   Past Surgical History:  Procedure Laterality Date   COLONOSCOPY WITH PROPOFOL N/A 02/11/2018   Procedure: COLONOSCOPY WITH PROPOFOL;  Surgeon: Lin Landsman, MD;  Location: [facility] ENDOSCOPY;  Service: Gastroenterology;  Laterality: N/A;   NO PAST SURGERIES     Patient Active Problem List   Diagnosis Date Noted   Right groin pain 06/04/2022   Proteinuria 07/10/2020   Abnormal mammogram 12/16/2019   Missed menses 09/14/2019   Left breast mass 05/16/2019   Acute wrist pain 05/16/2019   Acute pain of right shoulder 11/08/2018   Yeast infection 05/07/2018   Palpable thyroid 12/17/2017   BRBPR (bright red blood per rectum) 12/11/2017   Toenail deformity 12/11/2017   Vaginal bleeding 08/18/2017   Rectal bleeding 08/18/2017   Asthma 04/29/2016   Nicotine dependence 04/09/2015   Eczema 04/03/2015   Uncontrolled type 1 diabetes mellitus with hyperglycemia, with long-term current use of insulin ([place]) 07/20/2014   Neck pain 02/23/2014   Routine general medical examination at a health care facility 07/14/2013   Screening for breast cancer 07/14/2013    REFERRING DIAG: M54.2 (ICD-10-CM) -  Neck pain   THERAPY DIAG:  Cervicalgia  Rationale for Evaluation and Treatment Rehabilitation  PERTINENT HISTORY: Neck pain. Pain located L lateral neck and upper trap area. Pain began a month ago, waking up one morning. Massage provided momentary relief. Got a new pillow which did not help. Pain worsend since onset. Feels more swelling L lateral neck, MD aware. R hand dominant.   PRECAUTIONS: no known precautions  SUBJECTIVE:   SUBJECTIVE STATEMENT: L neck is good. No pain, just a little stiffness.  4/10 neck pain at most for the past 7 days.     PAIN:  Are you having pain?     TODAY'S TREATMENT:  DATE: 12/31/2022   No latex allergies per pt.   Therapeutic exercise   Recommended for pt to hold her purse with her hand instead of her shoulders so as to not activate scalene muscles   Seated B shoulder flexion with B scapular position 10x  Standing chin tucks with head on folded towel at wall 10x5 seconds for 3 sets  Seated manually resisted scapular retraction targeting the lower trap muscles    R 10x5 seconds for 3 sets   L 10x5 seconds for 3 sets  Seated manually resisted scapular depression isometrics    R 10x5 seconds for 2 sets    L 10x5 seconds for 2 sets    Improved exercise technique, movement at target joints, use of target muscles after mod verbal, visual, tactile cues.     Manual therapy  Seated STM R upper trap muscles to decrease tension    Slight decrease in neck stiffness reported afterwards.        Response to treatment Pt tolerated session well without aggravation of symptoms.     Clinical impression  Worked on improving cervical posture, lower trap strength, and decreasing upper trap muscle tension to decrease stress to neck and promote better movement and mechanics. Pt tolerated session well without  aggravation of symptoms. Decreased neck stiffness reported after manual therapy. Pt will benefit from continued skilled physical therapy services to continue to decrease pain, muscle tension as well as improve cervical AROM and function.        PATIENT EDUCATION: Education details: there-ex, HEP Person educated: Patient Education method: Explanation, Demonstration, Tactile cues, Verbal cues, and Handouts Education comprehension: verbalized understanding and returned demonstration  HOME EXERCISE PROGRAM: Access Code: [email or web address] URL: [email or web address] Date: 11/06/2022 Prepared by: Joneen Boers   Exercises - First Rib Mobilization with Strap  - 3 x daily - 7 x weekly - 1 sets - 3 reps - 30 seconds hold                          Then with R and L cervical rotation 10x3 each with strap on L side 3x per day - Supine Cervical Rotation AROM on Pillow  - 1 x daily - 7 x weekly - 3 sets - 10 reps - Prone Single Arm Shoulder Y  - 1 x daily - 7 x weekly - 2 sets - 10 reps    PT Short Term Goals - 12/17/22 1637       PT SHORT TERM GOAL #1   Title Pt will be independent with her initial HEP to decrease pain, improve strength, function, and ability to turn her head as well as look up more comfortably.    Baseline Pt has started her initial HEP (11/06/2022); Pt performing her HEP (12/17/2021)    Time 3    Period Weeks    Status Partially Met    Target Date 01/07/23              PT Long Term Goals - 12/17/22 1638       PT LONG TERM GOAL #1   Title Pt will have a decrease in neck pain to 3/10 or less at worst to promote ability to look up, as well as look around more comfortably.    Baseline 10/10 neck pain at worst for the past 3 weeks (11/06/2022); 7/10 at most for the past 7 days (12/17/2022)    Time 3    Period Weeks  Status Partially Met    Target Date 01/08/23      PT LONG TERM GOAL #2   Title Pt will improve L cervical rotation AROM to 55 degrees without  reports of pain to promote ability to look around more comfortably.    Baseline 42 degrees L cervical rotation with reproduction of symptoms (11/06/2022); 48 degrees to the L, 45 degrees to the R (12/17/2022)    Time 3    Period Weeks    Status Partially Met    Target Date 01/08/23      PT LONG TERM GOAL #3   Title Pt will improve B lower trap strength by at least 1/2 MMT grade to decrease B upper trap muscle tension and improve ability to turn her head more comfortably.    Baseline Lower trap: 4/5 R, 4/5 L (11/06/2022).; 4/5 R and L  (12/17/2022)    Time 3    Period Weeks    Status On-going    Target Date 01/08/23      PT LONG TERM GOAL #4   Title Pt will improve her neck FOTO score by at least 10 points as a demonstration of improved function.    Baseline Neck FOTO 44 (11/06/2022); 59 (12/17/2022)    Time 6    Period Weeks    Status Achieved    Target Date 12/18/22              Plan - 12/31/22 1716     Clinical Impression Statement Worked on improving cervical posture, lower trap strength, and decreasing upper trap muscle tension to decrease stress to neck and promote better movement and mechanics. Pt tolerated session well without aggravation of symptoms. Decreased neck stiffness reported after manual therapy. Pt will benefit from continued skilled physical therapy services to continue to decrease pain, muscle tension as well as improve cervical AROM and function.    Personal Factors and Comorbidities Comorbidity 3+    Comorbidities Alcoholic pancreatitis, DM, asthma    Examination-Activity Limitations Other   looking up, and turning her head   Stability/Clinical Decision Making Stable/Uncomplicated   Worsening pain per pt.   Rehab Potential Fair    PT Frequency 2x / week    PT Duration 3 weeks    PT Treatment/Interventions Therapeutic exercise;Therapeutic activities;Neuromuscular re-education;Patient/family education;Manual techniques;Dry needling;Electrical  Stimulation;Iontophoresis '4mg'$ /ml Dexamethasone    PT Next Visit Plan anterior cervical, B lower trap strengthening, decrasing upper trap and scalene muscle tension, manual techniques, modalities PRN    PT Home Exercise Plan Medbridge Access Code: [email or web address]    Consulted and Agree with Plan of Care Patient               Joneen Boers PT, DPT  12/31/2022, 6:08 PM

## 2023-01-05 ENCOUNTER — Ambulatory Visit: Payer: 59

## 2023-01-06 DIAGNOSIS — E119 Type 2 diabetes mellitus without complications: Secondary | ICD-10-CM | POA: Diagnosis not present

## 2023-01-06 DIAGNOSIS — H2513 Age-related nuclear cataract, bilateral: Secondary | ICD-10-CM | POA: Diagnosis not present

## 2023-01-06 DIAGNOSIS — H40003 Preglaucoma, unspecified, bilateral: Secondary | ICD-10-CM | POA: Diagnosis not present

## 2023-01-06 LAB — HM DIABETES EYE EXAM

## 2023-01-07 ENCOUNTER — Ambulatory Visit: Payer: 59

## 2023-01-08 ENCOUNTER — Encounter: Payer: Self-pay | Admitting: Internal Medicine

## 2023-01-08 ENCOUNTER — Ambulatory Visit: Payer: 59

## 2023-01-09 ENCOUNTER — Encounter: Payer: Self-pay | Admitting: Internal Medicine

## 2023-01-09 ENCOUNTER — Ambulatory Visit: Payer: 59 | Admitting: Internal Medicine

## 2023-01-09 VITALS — BP 144/96 | HR 95 | Ht 64.0 in | Wt 110.0 lb

## 2023-01-09 DIAGNOSIS — E049 Nontoxic goiter, unspecified: Secondary | ICD-10-CM | POA: Diagnosis not present

## 2023-01-09 DIAGNOSIS — E1065 Type 1 diabetes mellitus with hyperglycemia: Secondary | ICD-10-CM | POA: Diagnosis not present

## 2023-01-09 LAB — POCT GLYCOSYLATED HEMOGLOBIN (HGB A1C): Hemoglobin A1C: 8.9 % — AB (ref 4.0–5.6)

## 2023-01-09 NOTE — Progress Notes (Signed)
Patient ID: Mary Haney, female   DOB: 17-Mar-1971, 52 y.o.   MRN: [id number]  HPI: Mary Haney is a 52 y.o.-year-old female, returning for f/u for DM, dx 2013, insulin-dependent, uncontrolled, without long term complications. Last visit was 4 months ago. PCP: Dr. Caryl [facility]  Interim history: She continues to be very active at work.  She occasionally skips meals. No increased urination, blurry vision, nausea, chest pain.  Before last visit she lost 8 pounds. Before last visit, stopped her Dexcom G6 sensor 3 weeks ago due to skin irritation.  The freestyle libre sensor was not approved for her.  She tried the Skin Tac but did not tolerate it.  She was not able to use the sensor since last visit.  Reviewed history: In 07/2012 she went to the ED because she was not feeling well. She was found to have a blood glucose level of 600 + and was admitted with DKA. She was followed by Dr. Gabriel H[facility] immediately following initial diagnosis, then switched to [facility].   In 10/2013, due to lack of finances, she was stretching the insulin doses >> sugars high >> low CBG 25-26 >> ED.    She was admitted in 04/2016 for DKA.  At the end of 2017, she met the DM educator and nutritionist >> decided for Omnipod pump >> but nobody called her back and then she could not afford it anymore.  She decided against that insulin pump, afterwards.  Reviewed HbA1c levels: Lab Results  Component Value Date   HGBA1C 7.4 (A) 08/18/2022   HGBA1C 6.3 (A) 04/21/2022   HGBA1C 6.3 (A) 12/16/2021   HGBA1C 6.1 (A) 07/29/2021   HGBA1C 6.5 (A) 03/18/2021   HGBA1C 7.1 (A) 12/17/2020   HGBA1C 7.9 (H) 07/25/2020   HGBA1C 8.8 (A) 04/02/2020   HGBA1C 8.7 (A) 12/27/2019   HGBA1C 6.9 (A) 09/20/2019   HGBA1C 8.5 (A) 06/14/2019   HGBA1C 9.1 (A) 02/15/2019   HGBA1C 7.4 (A) 06/22/2018   HGBA1C 7.7 03/18/2018   HGBA1C 7.9 12/17/2017   HGBA1C 7.9 09/15/2017   HGBA1C 8.1 04/02/2017   HGBA1C 9.0 11/04/2016   HGBA1C  10.8 (H) 08/07/2016   HGBA1C 10.3 (H) 05/07/2016   Pt is on a regimen of: - Lantus >> Levemir ... >> 4-5 units at bedtime >> 3 units in a.m. and 5 units at bedtime - Novolog - ICR 1:13 for starches (including crackers) but use:    1:15 otherwise Bolus 10-15 minutes before each meal. Bolus 1 unit for coffee.  She checks her sugars more than 4 times a day: - am: 210-270 >> 180-250, 300s - 2h after b'fast: n/c - lunch: 60-170s >> 170-180 - 2h after lunch: n/c - dinner: 40, 60-170 >> 90-130 - 2h after dinner: n/c - bedtime: <200 >> 180-200  Previously:   Previously:   Initially:    Lowest sugar was  37!!! >> 40 >> 40 (skipping meals) >> 47; she has hypoglycemia awareness in the 60s. Highest sugar was 400 >> ... 200s >> 340 (ate but did not take insulin) >> 300.  Has ReliOn meter.  Pt's meals are: - coffee + cream + stevia right after she wakes up at 6:45 - Breakfast: oatmeal, yoghurt, sausage bisquit - grapes! - Lunch: salad, soup, sandwiches - Dinner: meat + starch + vegetables - Snacks: yoghurt, peanut crackers, nuts  She is very busy at work and does not have time allotted for lunch. She continues to grab lunch to go. She cannot inject insulin 15 minutes  before lunch many times.  -No CKD: Lab Results  Component Value Date   BUN 9 08/18/2022   Lab Results  Component Value Date   CREATININE 0.83 08/18/2022   -She has a history of dyslipidemia: Lab Results  Component Value Date   CHOL 180 08/18/2022   HDL 118.90 08/18/2022   LDLCALC 49 08/18/2022   LDLDIRECT 59.0 06/14/2019   TRIG 61.0 08/18/2022   CHOLHDL 2 08/18/2022   - last eye exam was in 01/06/2023: No DR  - no numbness and tingling in her feet.  Last foot exam 04/2022.  Latest TSH was normal: Lab Results  Component Value Date   TSH 1.62 08/18/2022   Her thyroid remains palpable.  Pt denies: - feeling nodules in neck - hoarseness - dysphagia - choking  ROS: + see HPI  I reviewed pt's  medications, allergies, PMH, social hx, family hx, and changes were documented in the history of present illness. Otherwise, unchanged from my initial visit note.  Past Medical History:  Diagnosis Date   Alcoholic pancreatitis    Asthma    Has albuterol inhaler   Diabetes mellitus without complication ([place])    Type 2 diagnosed 2013   Past Surgical History:  Procedure Laterality Date   COLONOSCOPY WITH PROPOFOL N/A 02/11/2018   Procedure: COLONOSCOPY WITH PROPOFOL;  Surgeon: Mary Landsman, MD;  Location[facility];  Service: Gastroenterology;  Laterality: N/A;   NO PAST SURGERIES     History   Social History   Marital Status: Single    Spouse Name: N/A    Number of Children: 0   Years of Education: 14   Occupational History   Take Out Specialist     [employer]   Social History Main Topics   Smoking status: Current Every Day Smoker -- 0.50 packs/day for 25 years    Types: Cigarettes   Smokeless tobacco: Never Used   Alcohol Use: 1.8 oz/week    3 Cans of beer per week   Drug Use: No   Sexual Activity: Yes    Birth Control/ Protection: Condom   Social History Narrative   Mary Haney was born in [place], [place]. She moved with her family to [place] at age 75. She attended 2 years at [employer] and was majoring in [place]. Her goal is to become a Optometrist in the [employer]. 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 Mary Haney lives with her boyfriend, Mary Haney. They are considering getting married soon. She enjoys sketching outdoor scenery, cartoons and loves to read.   Current Outpatient Medications on File Prior to Visit  Medication Sig Dispense Refill   albuterol (PROAIR HFA) 108 (90 Base) MCG/ACT inhaler Inhale 2 puffs into the lungs every 6 (six) hours as needed for wheezing or shortness of breath. 1 each 1   B-D ULTRAFINE III SHORT PEN 31G X 8 MM MISC USE FIVE TIMES DAILY 300 each 3   cetirizine (ZYRTEC) 10 MG tablet Take 10 mg by  mouth daily as needed for allergies.      cyclobenzaprine (FLEXERIL) 5 MG tablet TAKE 1 TABLET BY MOUTH THREE TIMES A DAY AS NEEDED FOR MUSCLE SPASMS 15 tablet 1   Glucagon 3 MG/DOSE POWD Place 3 mg into the nose once as needed for up to 1 dose. 1 each 11   hydrocortisone (ANUSOL-HC) 25 MG suppository Place 1 suppository (25 mg total) rectally 2 (two) times daily. 12 suppository 0   insulin aspart (NOVOLOG  FLEXPEN) 100 UNIT/ML FlexPen INJECT 2-9 UNITS INTO THE SKIN 3 (THREE) TIMES DAILY WITH MEALS. 15 mL 1   insulin detemir (LEVEMIR FLEXPEN) 100 UNIT/ML FlexPen INJECT 12 UNITS INTO THE SKIN DAILY. 15 mL 1   insulin glargine-yfgn (SEMGLEE) 100 UNIT/ML Pen Inject 10-12 Units into the skin daily. 15 mL 2   Insulin Pen Needle (B-D UF III MINI PEN NEEDLES) 31G X 5 MM MISC USE 5x WHEN TAKING INSULIN. 300 each 3   Insulin Syringe-Needle U-100 (BD INSULIN SYRINGE U/F) 30G X 1/2" 0.5 ML MISC USE 2x a day WHEN TAKING INSULIN 200 each 3   meloxicam (MOBIC) 7.5 MG tablet TAKE 1 TABLET BY MOUTH DAILY AS NEEDED FOR PAIN 30 tablet 1   triamcinolone ointment (KENALOG) 0.1 % APPLY 1 APPLICATION TOPICALLY 2 TIMES DAILY AS NEEDED. LIMIT USE DUE RISK OF SKIN DISCOLORATION. 465 g 1   No current facility-administered medications on file prior to visit.   No Known Allergies Family History  Problem Relation Age of Onset   Diabetes Father    Depression Paternal Grandmother    Depression Paternal Grandfather    Depression Cousin    Breast cancer Neg Hx    Arthritis/Rheumatoid Neg Hx    PE: BP (!) 144/96 (BP Location: Left Arm, Patient Position: Sitting, Cuff Size: Small)   Pulse 95   Ht '5\' 4"'$  (1.626 m)   Wt 110 lb (49.9 kg)   SpO2 100%   BMI 18.88 kg/m  Wt Readings from Last 3 Encounters:  01/09/23 110 lb (49.9 kg)  10/08/22 111 lb 3.2 oz (50.4 kg)  08/18/22 110 lb 9.6 oz (50.2 kg)   Constitutional: thin, in NAD Eyes: EOMI, no exophthalmos ENT: + slight, symmetric thyromegaly, no cervical  lymphadenopathy Cardiovascular: Tachycardia, RR, No MRG Respiratory: CTA B Musculoskeletal: no deformities Skin: no rashes Neurological: no tremor with outstretched hands  ASSESSMENT: 1. DM, insulin-dependent, uncontrolled, with complications - hyper- and hypo-glycemia - History of DKA  Component     Latest Ref Rng & Units 11/04/2016  Hemoglobin A1C      9.0  C-Peptide     0.80 - 3.85 ng/mL <0.10 (L)  Glucose, Fasting     65 - 99 mg/dL 40 (L)  Glutamic Acid Decarb Ab     <5 IU/mL >250 (H)  Pancreatic Islet Cell Antibody     <5 JDF Units <5   Glucose very low at the time of the blood draw, therefore, C-peptide is not interpretable. However, GAD antibodies are undetectably high, confirming type 1 diabetes.  2.  Palpable thyroid  PLAN:  1. Patient with history of uncontrolled type 1 diabetes, on basal/bolus insulin regimen with increased insulin sensitivity, managed with low insulin doses.  Her diabetes started to improve after she started the CGM but unfortunately she was off the Dexcom at last visit due to skin irritation.  I recommended Tegaderm between the sensor and her skin.  At that time, sugars were high in the morning and we increased her evening Levemir.  Since sugars were decreasing during the day after her morning dose, we also decreased this.  I advised her to inject her mealtime insulin 15 minutes prior to the meals, but otherwise we did not change her regimen. -At today's visit, sugars are higher.  He even has values in the 300s in the morning.  We discussed about increasing her Levemir dose at night.  Also, since sugars after dinner are high, I advised her to use a stricter insulin to  carb ratio with this meal. -We absolutely need to get her back on a CGM.  She had irritation with the Dexcom G6 sensor and at today's visit we gave her a sample of the Dexcom G7.  I advised her to let me know if we need to send a prescription for this to her pharmacy.  Otherwise, I would  like to apply for a PA for the freestyle libre 3 CGM. -  I suggested to:  Patient Instructions  Please use: - Levemir 3 units in a.m. and 5 >> 6 units at bedtime - Novolog - ICR 1:13 for starches (including crackers) but use:    1:15 otherwise  - use 1:13  with dinner Bolus 10-15 minutes before each meal. Bolus 1 unit for coffee.  Please come back for a follow-up appointment in 3-4 months.  - we checked her HbA1c: 8.9% (higher) - advised to check sugars at different times of the day - 4x a day, rotating check times - advised for yearly eye exams >> she is UTD - return to clinic in 4 months  2.  Palpable thyroid -Thyroid is symmetric and nonnodular on palpation -No neck symptoms -TSH was normal at last check Lab Results  Component Value Date   TSH 1.62 08/18/2022  -We are following her clinically  Philemon Kingdom, MD PhD [facility]

## 2023-01-09 NOTE — Patient Instructions (Addendum)
Please use: - Levemir 3 units in a.m. and 5 >> 6 units at bedtime - Novolog - ICR 1:13 for starches (including crackers) but use:    1:15 otherwise  - use 1:13  with dinner Bolus 10-15 minutes before each meal. Bolus 1 unit for coffee.  Please come back for a follow-up appointment in 3-4 months.

## 2023-01-31 ENCOUNTER — Other Ambulatory Visit: Payer: Self-pay | Admitting: Family

## 2023-01-31 DIAGNOSIS — M542 Cervicalgia: Secondary | ICD-10-CM

## 2023-02-01 ENCOUNTER — Other Ambulatory Visit: Payer: Self-pay | Admitting: Internal Medicine

## 2023-02-01 DIAGNOSIS — E1065 Type 1 diabetes mellitus with hyperglycemia: Secondary | ICD-10-CM

## 2023-02-27 ENCOUNTER — Telehealth: Payer: Self-pay | Admitting: Pharmacist

## 2023-02-27 NOTE — Telephone Encounter (Signed)
Outreached patient to discuss upcoming Levemir discontinuation. Left voicemail for patient to return my call at her convenience.   Catie Hedwig Morton, PharmD, [place], [facility] [phone]

## 2023-03-04 ENCOUNTER — Telehealth: Payer: Self-pay | Admitting: Family

## 2023-03-04 NOTE — Telephone Encounter (Signed)
Call pt  Mammogram due Please sch for her

## 2023-03-05 ENCOUNTER — Other Ambulatory Visit: Payer: Self-pay | Admitting: Family

## 2023-03-05 DIAGNOSIS — Z1231 Encounter for screening mammogram for malignant neoplasm of breast: Secondary | ICD-10-CM

## 2023-03-05 NOTE — Telephone Encounter (Signed)
Called [facility] and scheduled Mammogram for pt on Mon  03/30/23 @ 7:40 am. Pt is aware

## 2023-03-30 ENCOUNTER — Ambulatory Visit
Admission: RE | Admit: 2023-03-30 | Discharge: 2023-03-30 | Disposition: A | Discharge status: Home / Self Care | Payer: 59 | Source: Ambulatory Visit | Attending: Family | Admitting: Family

## 2023-03-30 DIAGNOSIS — Z1231 Encounter for screening mammogram for malignant neoplasm of breast: Secondary | ICD-10-CM | POA: Insufficient documentation

## 2023-04-12 ENCOUNTER — Other Ambulatory Visit: Payer: Self-pay | Admitting: Internal Medicine

## 2023-06-01 ENCOUNTER — Ambulatory Visit: Payer: 59 | Admitting: Internal Medicine

## 2023-06-01 NOTE — Progress Notes (Deleted)
Patient ID: Mary [facility], female   DOB: [birth year], 52 y.o.   MRN: [id number]  HPI: Mary Haney is a 52 y.o.-year-old female, returning for f/u for DM, dx 2013, insulin-dependent, uncontrolled, without long term complications. Last visit was 4 months ago. PCP: Dr. Birdie [facility]  Interim history: She continues to be very active at work.  She occasionally skips meals. No increased urination, blurry vision, nausea, chest pain.   Last year, she stopped her Dexcom G6 sensor due to skin irritation.  The freestyle libre sensor was not approved for her.  She tried the Skin Tac but did not tolerate it.  She remains off the sensor.  Reviewed history: In 07/2012 she went to the ED because she was not feeling well. She was found to have a blood glucose level of 600 + and was admitted with DKA. She was followed by Dr. Tedd H[facility] immediately following initial diagnosis, then switched to [facility] endocrinology.   In 10/2013, due to lack of finances, she was stretching the insulin doses >> sugars high >> low CBG 25-26 >> ED.    She was admitted in 04/2016 for DKA.  At the end of 2017, she met the DM educator and nutritionist >> decided for Omnipod pump >> but nobody called her back and then she could not afford it anymore.  She decided against that insulin pump, afterwards.  Reviewed HbA1c levels: Lab Results  Component Value Date   HGBA1C 8.9 (A) 01/09/2023   HGBA1C 7.4 (A) 08/18/2022   HGBA1C 6.3 (A) 04/21/2022   HGBA1C 6.3 (A) 12/16/2021   HGBA1C 6.1 (A) 07/29/2021   HGBA1C 6.5 (A) 03/18/2021   HGBA1C 7.1 (A) 12/17/2020   HGBA1C 7.9 (H) 07/25/2020   HGBA1C 8.8 (A) 04/02/2020   HGBA1C 8.7 (A) 12/27/2019   HGBA1C 6.9 (A) 09/20/2019   HGBA1C 8.5 (A) 06/14/2019   HGBA1C 9.1 (A) 02/15/2019   HGBA1C 7.4 (A) 06/22/2018   HGBA1C 7.7 03/18/2018   HGBA1C 7.9 12/17/2017   HGBA1C 7.9 09/15/2017   HGBA1C 8.1 04/02/2017   HGBA1C 9.0 11/04/2016   HGBA1C 10.8 (H) 08/07/2016   Pt is on a regimen of: -  Levemir 3 units in a.m. and 5 >> 6 units at bedtime - Novolog - ICR 1:13 for starches (including crackers) but use:    1:15 otherwise  - use 1:13  with dinner Bolus 10-15 minutes before each meal. Bolus 1 unit for coffee.  She checks her sugars more than 4 times a day: - am: 210-270 >> 180-250, 300s - 2h after b'fast: n/c - lunch: 60-170s >> 170-180 - 2h after lunch: n/c - dinner: 40, 60-170 >> 90-130 - 2h after dinner: n/c - bedtime: <200 >> 180-200  Previously:   Initially:    Lowest sugar was  37!!! >> 40 >> 40 (skipping meals) >> 47; she has hypoglycemia awareness in the 60s. Highest sugar was 400 >> ... 200s >> 340 (ate but did not take insulin) >> 300.  Has ReliOn meter.  Pt's meals are: - coffee + cream + stevia right after she wakes up at 6:45 - Breakfast: oatmeal, yoghurt, sausage bisquit - grapes! - Lunch: salad, soup, sandwiches - Dinner: meat + starch + vegetables - Snacks: yoghurt, peanut crackers, nuts  She is very busy at work and does not have time allotted for lunch. She continues to grab lunch to go. She cannot inject insulin 15 minutes before lunch many times.  -No CKD: Lab Results  Component Value Date   BUN 9 08/18/2022  Lab Results  Component Value Date   CREATININE 0.83 08/18/2022   -She has a history of dyslipidemia: Lab Results  Component Value Date   CHOL 180 08/18/2022   HDL 118.90 08/18/2022   LDLCALC 49 08/18/2022   LDLDIRECT 59.0 06/14/2019   TRIG 61.0 08/18/2022   CHOLHDL 2 08/18/2022   - last eye exam was in 01/06/2023: No DR  - no numbness and tingling in her feet.  Last foot exam 04/2022.  Latest TSH was normal: Lab Results  Component Value Date   TSH 1.62 08/18/2022   Her thyroid remains palpable.  Pt denies: - feeling nodules in neck - hoarseness - dysphagia - choking  ROS: + see HPI  I reviewed pt's medications, allergies, PMH, social hx, family hx, and changes were documented in the history of present  illness. Otherwise, unchanged from my initial visit note.  Past Medical History:  Diagnosis Date   Alcoholic pancreatitis    Asthma    Has albuterol inhaler   Diabetes mellitus without complication ([facility])    Type 2 diagnosed 2013   Past Surgical History:  Procedure Laterality Date   COLONOSCOPY WITH PROPOFOL N/A 02/11/2018   Procedure: COLONOSCOPY WITH PROPOFOL;  Surgeon: Toney Reil, MD;  Location: [facility] ENDOSCOPY;  Service: Gastroenterology;  Laterality: N/A;   NO PAST SURGERIES     History   Social History   Marital Status: Single    Spouse Name: N/A    Number of Children: 0   Years of Education: 14   Occupational History   Take Out Specialist     [employer]   Social History Main Topics   Smoking status: Current Every Day Smoker -- 0.50 packs/day for 25 years    Types: Cigarettes   Smokeless tobacco: Never Used   Alcohol Use: 1.8 oz/week    3 Cans of beer per week   Drug Use: No   Sexual Activity: Yes    Birth Control/ Protection: Condom   Social History Narrative   Mary Haney was born in [place], [place]. She moved with her family to [place] at age 64. She attended 2 years at [employer] and was majoring in [place] Studies. Her goal is to become a Nurse, learning disability in the [employer]. 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 Mary Haney lives with her boyfriend, Mary Haney. They are considering getting married soon. She enjoys sketching outdoor scenery, cartoons and loves to read.   Current Outpatient Medications on File Prior to Visit  Medication Sig Dispense Refill   albuterol (PROAIR HFA) 108 (90 Base) MCG/ACT inhaler Inhale 2 puffs into the lungs every 6 (six) hours as needed for wheezing or shortness of breath. 1 each 1   B-D ULTRAFINE III SHORT PEN 31G X 8 MM MISC USE FIVE TIMES DAILY 300 each 3   cetirizine (ZYRTEC) 10 MG tablet Take 10 mg by mouth daily as needed for allergies.      cyclobenzaprine (FLEXERIL) 5 MG tablet TAKE 1 TABLET BY MOUTH  THREE TIMES A DAY AS NEEDED FOR MUSCLE SPASMS 15 tablet 1   Glucagon 3 MG/DOSE POWD Place 3 mg into the nose once as needed for up to 1 dose. 1 each 11   hydrocortisone (ANUSOL-HC) 25 MG suppository Place 1 suppository (25 mg total) rectally 2 (two) times daily. 12 suppository 0   insulin aspart (NOVOLOG FLEXPEN) 100 UNIT/ML FlexPen INJECT 2-9 UNITS INTO THE SKIN 3 (THREE) TIMES DAILY WITH MEALS. 15 mL 1  insulin detemir (LEVEMIR FLEXPEN) 100 UNIT/ML FlexPen INJECT 12 UNITS INTO THE SKIN DAILY. 15 mL 1   insulin glargine-yfgn (SEMGLEE) 100 UNIT/ML Pen Inject 10-12 Units into the skin daily. 15 mL 2   Insulin Pen Needle (B-D UF III MINI PEN NEEDLES) 31G X 5 MM MISC USE 5x WHEN TAKING INSULIN. 300 each 3   Insulin Syringe-Needle U-100 (BD INSULIN SYRINGE U/F) 30G X 1/2" 0.5 ML MISC USE 2x a day WHEN TAKING INSULIN 200 each 3   meloxicam (MOBIC) 7.5 MG tablet TAKE 1 TABLET BY MOUTH EVERY DAY AS NEEDED FOR PAIN 30 tablet 1   triamcinolone ointment (KENALOG) 0.1 % APPLY 1 APPLICATION TOPICALLY 2 TIMES DAILY AS NEEDED. LIMIT USE DUE RISK OF SKIN DISCOLORATION. 465 g 1   No current facility-administered medications on file prior to visit.   No Known Allergies Family History  Problem Relation Age of Onset   Diabetes Father    Depression Paternal Grandmother    Depression Paternal Grandfather    Depression Cousin    Breast cancer Neg Hx    Arthritis/Rheumatoid Neg Hx    PE: There were no vitals taken for this visit. Wt Readings from Last 3 Encounters:  01/09/23 110 lb (49.9 kg)  10/08/22 111 lb 3.2 oz (50.4 kg)  08/18/22 110 lb 9.6 oz (50.2 kg)   Constitutional: thin, in NAD Eyes: EOMI, no exophthalmos ENT: + slight, symmetric thyromegaly, no cervical lymphadenopathy Cardiovascular: Tachycardia, RR, No MRG Respiratory: CTA B Musculoskeletal: no deformities Skin: no rashes Neurological: no tremor with outstretched hands  ASSESSMENT: 1. DM, insulin-dependent, uncontrolled, with  complications - hyper- and hypo-glycemia - History of DKA  Component     Latest Ref Rng & Units 11/04/2016  Hemoglobin A1C      9.0  C-Peptide     0.80 - 3.85 ng/mL <0.10 (L)  Glucose, Fasting     65 - 99 mg/dL 40 (L)  Glutamic Acid Decarb Ab     <5 IU/mL >250 (H)  Pancreatic Islet Cell Antibody     <5 JDF Units <5   Glucose very low at the time of the blood draw, therefore, C-peptide is not interpretable. However, GAD antibodies are undetectably high, confirming type 1 diabetes.  2.  Palpable thyroid  PLAN:  1. Patient with history of uncontrolled type 1 diabetes, on basal/bolus insulin regimen with increased insulin sensitivity, managed with low insulin doses.  Her diabetes is starting to improve after she started the CGM, but unfortunately, she came off of the Dexcom G6 due to skin irritation.  I recommended Tegaderm between the sensor and the skin.  At last visit I gave her a sample of the Dexcom G7 CGM and advised her to let me know if she can tolerate this so I can send a prescription to the pharmacy.  Otherwise, we discussed about getting a PA for the freestyle libre 3 CGM. -Sugars worsened after coming off the sensor and at last visit she even had values in the 300s in the morning.  We increased her Levemir at night and since sugars after dinner were also high, I advised her to strengthen her insulin to carb ratio with this meal.  -  I suggested to:  Patient Instructions  Please use: - Levemir 3 units in a.m. and 6 units at bedtime - Novolog - ICR 1:13 for starches (including crackers) but use:    1:15 otherwise  Bolus 10-15 minutes before each meal. Bolus 1 unit for coffee.  Please come  back for a follow-up appointment in 3-4 months.  - we checked her HbA1c: 7%  - advised to check sugars at different times of the day - 4x a day, rotating check times - advised for yearly eye exams >> she is UTD - return to clinic in 3-4 months  2.  Palpable thyroid -Thyroid is  symmetric and nonnodular on palpation -She denies neck compression symptoms -TSH was normal at last check: Lab Results  Component Value Date   TSH 1.62 08/18/2022  -Following her clinically  Carlus Pavlov, MD PhD [facility] Endocrinology

## 2023-06-15 ENCOUNTER — Ambulatory Visit: Payer: 59 | Admitting: Internal Medicine

## 2023-06-15 ENCOUNTER — Encounter: Payer: Self-pay | Admitting: Internal Medicine

## 2023-06-15 VITALS — BP 138/80 | HR 114 | Ht 64.0 in | Wt 110.4 lb

## 2023-06-15 DIAGNOSIS — E1065 Type 1 diabetes mellitus with hyperglycemia: Secondary | ICD-10-CM | POA: Diagnosis not present

## 2023-06-15 DIAGNOSIS — Z794 Long term (current) use of insulin: Secondary | ICD-10-CM | POA: Diagnosis not present

## 2023-06-15 DIAGNOSIS — E049 Nontoxic goiter, unspecified: Secondary | ICD-10-CM

## 2023-06-15 LAB — HEMOGLOBIN A1C: Hemoglobin A1C: 6.5

## 2023-06-15 NOTE — Patient Instructions (Addendum)
Please decrease: - Levemir 3-4 units in a.m. and 6 units at bedtime - Novolog - ICR 1:13 for starches (including crackers) but use:    1:15 otherwise except 1:13 for dinner Bolus 10-15 minutes before each meal. Bolus 1 unit for coffee.  Try to stop sweets in the afternoon.  Let me know if we can use the G7 sensor.  Please come back for a follow-up appointment in 4 months.

## 2023-06-15 NOTE — Progress Notes (Signed)
Patient ID: Mary Haney, female   DOB: 1971-11-04, 52 y.o.   MRN: [id number]  HPI: Mary Haney is a 52 y.o.-year-old female, returning for f/u for DM, dx 2013, insulin-dependent, uncontrolled, without long term complications. Last visit was 5 months ago. PCP: Dr. Birdie Haney  Interim history: She continues to be very active at work.  She occasionally skips meals. No increased urination, blurry vision, nausea, chest pain.   Last year, she stopped her Dexcom G6 sensor due to skin irritation.  The freestyle libre sensor was not approved for her.  She tried the Skin Tac/Tegaderm but did not tolerate it. At last OV we gave her a Dexcom G7 sensor to try, but she lost it. Mary Haney remains off the sensor.  Reviewed history: In 07/2012 she went to the ED because she was not feeling well. She was found to have a blood glucose level of 600 + and was admitted with DKA. She was followed by Dr. Tedd Haney immediately following initial diagnosis, then switched to [facility] endocrinology.   In 10/2013, due to lack of finances, she was stretching the insulin doses >> sugars high >> low CBG 25-26 >> ED.    She was admitted in 04/2016 for DKA.  At the end of 2017, she met the DM educator and nutritionist >> decided for Omnipod pump >> but nobody called her back and then she could not afford it anymore.  She decided against that insulin pump, afterwards.  Reviewed HbA1c levels: Lab Results  Component Value Date   HGBA1C 8.9 (A) 01/09/2023   HGBA1C 7.4 (A) 08/18/2022   HGBA1C 6.3 (A) 04/21/2022   HGBA1C 6.3 (A) 12/16/2021   HGBA1C 6.1 (A) 07/29/2021   HGBA1C 6.5 (A) 03/18/2021   HGBA1C 7.1 (A) 12/17/2020   HGBA1C 7.9 (H) 07/25/2020   HGBA1C 8.8 (A) 04/02/2020   HGBA1C 8.7 (A) 12/27/2019   HGBA1C 6.9 (A) 09/20/2019   HGBA1C 8.5 (A) 06/14/2019   HGBA1C 9.1 (A) 02/15/2019   HGBA1C 7.4 (A) 06/22/2018   HGBA1C 7.7 03/18/2018   HGBA1C 7.9 12/17/2017   HGBA1C 7.9 09/15/2017   HGBA1C 8.1 04/02/2017   HGBA1C  9.0 11/04/2016   HGBA1C 10.8 (H) 08/07/2016   Pt is on a regimen of: - Levemir 3 units in a.m. and 5 >> 6 units at bedtime >> 5 units in am and 6 units at night - Novolog - ICR 1:13 for starches (including crackers) but use:    1:15 otherwise  - use 1:13  with dinner Bolus 10-15 minutes before each meal. Bolus 1 unit for coffee.  She checks her sugars more than 4 times a day: - am: 210-270 >> 180-250, 300s >> 108-210  - 2h after b'fast: n/c (2 units for b'fast) - lunch: 60-170s >> 170-180 >> 40, 68-80 - 2h after lunch: n/c (2-3 units for lunch) - dinner: 40, 60-170 >> 90-130 >> 180-200 (fig newtons) - 2h after dinner: n/c - bedtime: <200 >> 180-200 >> <250  Previously:   Initially:    Lowest sugar was  37!!! >> 40 >> 40 (skipping meals) >> 47; she has hypoglycemia awareness in the 60s. Highest sugar was 400 >> ... 200s >> 340 (ate but did not take insulin) >> 300 >> 350.  Has ReliOn meter.  Pt's meals are: - coffee + cream + stevia right after she wakes up at 6:45 - Breakfast: oatmeal, yoghurt, sausage bisquit - - Lunch: salad, soup, sandwiches  fig newtons! - Dinner: meat + starch + vegetables - Snacks: yoghurt,  peanut crackers, nuts  She is very busy at work and does not have time allotted for lunch. She continues to grab lunch to go. She cannot inject insulin 15 minutes before lunch many times.  -No CKD: Lab Results  Component Value Date   BUN 9 08/18/2022   Lab Results  Component Value Date   CREATININE 0.83 08/18/2022   -She has a history of dyslipidemia: Lab Results  Component Value Date   CHOL 180 08/18/2022   HDL 118.90 08/18/2022   LDLCALC 49 08/18/2022   LDLDIRECT 59.0 06/14/2019   TRIG 61.0 08/18/2022   CHOLHDL 2 08/18/2022   - last eye exam was in 01/06/2023: No DR  - no numbness and tingling in her feet.  Last foot exam 04/2022.  Latest TSH was normal: Lab Results  Component Value Date   TSH 1.62 08/18/2022   Her thyroid remains  palpable.  Pt denies: - feeling nodules in neck - hoarseness - dysphagia - choking  ROS: + see HPI  I reviewed pt's medications, allergies, PMH, social hx, family hx, and changes were documented in the history of present illness. Otherwise, unchanged from my initial visit note.  Past Medical History:  Diagnosis Date   Alcoholic pancreatitis    Asthma    Has albuterol inhaler   Diabetes mellitus without complication (HCC)    Type 2 diagnosed 2013   Past Surgical History:  Procedure Laterality Date   COLONOSCOPY WITH PROPOFOL N/A 02/11/2018   Procedure: COLONOSCOPY WITH PROPOFOL;  Surgeon: Toney Reil, MD;  Location: [facility] ENDOSCOPY;  Service: Gastroenterology;  Laterality: N/A;   NO PAST SURGERIES     History   Social History   Marital Status: Single    Spouse Name: N/A    Number of Children: 0   Years of Education: 14   Occupational History   Take Out Specialist     [employer]   Social History Main Topics   Smoking status: Current Every Day Smoker -- 0.50 packs/day for 25 years    Types: Cigarettes   Smokeless tobacco: Never Used   Alcohol Use: 1.8 oz/week    3 Cans of beer per week   Drug Use: No   Sexual Activity: Yes    Birth Control/ Protection: Condom   Social History Narrative   Mary Haney was born in [place], [place]. She moved with her family to [place] at age 64. She attended 2 years at [employer] and was majoring in [place] Studies. Her goal is to become a Nurse, learning disability in the [employer]. 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 Mary Haney lives with her boyfriend, Mary Haney. They are considering getting married soon. She enjoys sketching outdoor scenery, cartoons and loves to read.   Current Outpatient Medications on File Prior to Visit  Medication Sig Dispense Refill   albuterol (PROAIR HFA) 108 (90 Base) MCG/ACT inhaler Inhale 2 puffs into the lungs every 6 (six) hours as needed for wheezing or shortness of breath. 1 each 1    B-D ULTRAFINE III SHORT PEN 31G X 8 MM MISC USE FIVE TIMES DAILY 300 each 3   cetirizine (ZYRTEC) 10 MG tablet Take 10 mg by mouth daily as needed for allergies.      cyclobenzaprine (FLEXERIL) 5 MG tablet TAKE 1 TABLET BY MOUTH THREE TIMES A DAY AS NEEDED FOR MUSCLE SPASMS 15 tablet 1   Glucagon 3 MG/DOSE POWD Place 3 mg into the nose once as needed for up to  1 dose. 1 each 11   hydrocortisone (ANUSOL-HC) 25 MG suppository Place 1 suppository (25 mg total) rectally 2 (two) times daily. 12 suppository 0   insulin aspart (NOVOLOG FLEXPEN) 100 UNIT/ML FlexPen INJECT 2-9 UNITS INTO THE SKIN 3 (THREE) TIMES DAILY WITH MEALS. 15 mL 1   insulin detemir (LEVEMIR FLEXPEN) 100 UNIT/ML FlexPen INJECT 12 UNITS INTO THE SKIN DAILY. 15 mL 1   insulin glargine-yfgn (SEMGLEE) 100 UNIT/ML Pen Inject 10-12 Units into the skin daily. 15 mL 2   Insulin Pen Needle (B-D UF III MINI PEN NEEDLES) 31G X 5 MM MISC USE 5x WHEN TAKING INSULIN. 300 each 3   Insulin Syringe-Needle U-100 (BD INSULIN SYRINGE U/F) 30G X 1/2" 0.5 ML MISC USE 2x a day WHEN TAKING INSULIN 200 each 3   meloxicam (MOBIC) 7.5 MG tablet TAKE 1 TABLET BY MOUTH EVERY DAY AS NEEDED FOR PAIN 30 tablet 1   triamcinolone ointment (KENALOG) 0.1 % APPLY 1 APPLICATION TOPICALLY 2 TIMES DAILY AS NEEDED. LIMIT USE DUE RISK OF SKIN DISCOLORATION. 465 g 1   No current facility-administered medications on file prior to visit.   No Known Allergies Family History  Problem Relation Age of Onset   Diabetes Father    Depression Paternal Grandmother    Depression Paternal Grandfather    Depression Cousin    Breast cancer Neg Hx    Arthritis/Rheumatoid Neg Hx    PE: BP 138/80   Pulse (!) 114   Ht 5\' 4"  (1.626 m)   Wt 110 lb 6.4 oz (50.1 kg)   SpO2 98%   BMI 18.95 kg/m  Wt Readings from Last 3 Encounters:  06/15/23 110 lb 6.4 oz (50.1 kg)  01/09/23 110 lb (49.9 kg)  10/08/22 111 lb 3.2 oz (50.4 kg)   Constitutional: thin, in NAD Eyes: EOMI, no  exophthalmos ENT: + slight, symmetric thyromegaly, no cervical lymphadenopathy Cardiovascular: Tachycardia, RR, No MRG Respiratory: CTA B Musculoskeletal: no deformities Skin: no rashes Neurological: no tremor with outstretched hands Diabetic Foot Exam - Simple   Simple Foot Form Diabetic Foot exam was performed with the following findings: Yes 06/15/2023  4:56 PM  Visual Inspection No deformities, no ulcerations, no other skin breakdown bilaterally: Yes Sensation Testing Intact to touch and monofilament testing bilaterally: Yes Pulse Check Posterior Tibialis and Dorsalis pulse intact bilaterally: Yes Comments    ASSESSMENT: 1. DM, insulin-dependent, uncontrolled, with complications - hyper- and hypo-glycemia - History of DKA  Component     Latest Ref Rng & Units 11/04/2016  Hemoglobin A1C      9.0  C-Peptide     0.80 - 3.85 ng/mL <0.10 (L)  Glucose, Fasting     65 - 99 mg/dL 40 (L)  Glutamic Acid Decarb Ab     <5 IU/mL >250 (H)  Pancreatic Islet Cell Antibody     <5 JDF Units <5   Glucose very low at the time of the blood draw, therefore, C-peptide is not interpretable. However, GAD antibodies are undetectably high, confirming type 1 diabetes.  2.  Palpable thyroid  PLAN:  1. Patient with history of uncontrolled type 1 diabetes, on basal/bolus insulin regimen with increased insulin sensitivity, managed with low insulin doses.  Her diabetes improved after she started the CGM, but unfortunately she had to come off the CGM due to skin irritation.  At that time, she was using the Dexcom G6.  I recommended Tegaderm between the sensor and the skin.  We also gave her samples of the  Dexcom G7 CGM to see if she could tolerate this and we discussed that if she could not, to try to get a PA for the freestyle libre 3 CGM.  Sugars after coming off the sensor were elevated, and at last visit she even had values in the 300s in the morning.  We increased her Levemir at night and, since her  sugars after dinner were also high, I advised her to strengthen the insulin to carb ratio with this meal. -At today's visit, she remains off the sensor as she lost the sample that I gave her at last visit.  She did not let me know about this so I could give her another sensor.  At today's visit, we gave her a G7 sensor and I advised her to let me know if she can tolerate this so I can send a prescription to her pharmacy. -Reviewing her blood sugars at home, they are slightly better in the morning but they remain fluctuating, which sugars up to 200s.  I believe that this is due to high blood sugars at bedtime, which are related to high blood sugars before dinner.  Upon questioning, she has fig newtons before dinner and sugars increase drastically afterwards.  I strongly advised her to stop these and, if she has to snack, to either snack on fruit or low-carb foods like peanuts. -She is dropping her sugars too low midday, especially if she delays a meal.  Upon questioning, she is not on 3 units of Lantus in the morning but 5 units, which I feel is too much for her.  We discussed about reducing this by 1 to 2 units. -  I suggested to:  Patient Instructions  Please decrease: - Levemir 3-4 units in a.m. and 6 units at bedtime - Novolog - ICR 1:13 for starches (including crackers) but use:    1:15 otherwise except 1:13 for dinner Bolus 10-15 minutes before each meal. Bolus 1 unit for coffee.  Try to stop sweets in the afternoon.  Let me know if we can use the G7 sensor.  Please come back for a follow-up appointment in 4 months.  - we checked her HbA1c: 6.5% (lower) - advised to check sugars at different times of the day - 4x a day, rotating check times - advised for yearly eye exams >> she is UTD - return to clinic in 4 months  2.  Palpable thyroid -Thyroid is symmetric and nonnodular on palpation -She denies neck compression symptoms -TSH was normal at last check: Lab Results  Component Value  Date   TSH 1.62 08/18/2022  -We will continue to follow her clinically  Carlus Pavlov, MD PhD [facility] Endocrinology

## 2023-06-19 ENCOUNTER — Ambulatory Visit: Payer: 59 | Admitting: Internal Medicine

## 2023-07-14 ENCOUNTER — Ambulatory Visit: Payer: 59 | Admitting: Nurse Practitioner

## 2023-07-14 ENCOUNTER — Encounter: Payer: Self-pay | Admitting: Nurse Practitioner

## 2023-07-14 VITALS — BP 138/84 | HR 100 | Temp 98.2°F | Resp 16 | Ht 63.0 in | Wt 109.0 lb

## 2023-07-14 DIAGNOSIS — L739 Follicular disorder, unspecified: Secondary | ICD-10-CM | POA: Diagnosis not present

## 2023-07-14 HISTORY — DX: Follicular disorder, unspecified: L73.9

## 2023-07-14 MED ORDER — CEPHALEXIN 500 MG PO CAPS: 500.0000 mg | ORAL_CAPSULE | Freq: Two times a day (BID) | ORAL | 0 refills | Status: AC

## 2023-07-14 NOTE — Progress Notes (Unsigned)
Established Patient Office Visit  Subjective:  Patient ID: Mary Haney, female    DOB: November 29, 1971  Age: 52 y.o. MRN: [id number]  CC:  Chief Complaint  Patient presents with   Gynecologic Exam    Bump in vaginal area    HPI  Mary Haney presents for:  Gynecologic Exam The patient's primary symptoms include a genital rash. This is a recurrent problem. Episode onset: 2 days ago. The problem has been unchanged. The problem affects the left side.     Past Medical History:  Diagnosis Date   Alcoholic pancreatitis    Asthma    Has albuterol inhaler   Diabetes mellitus without complication (HCC)    Type 2 diagnosed 2013    Past Surgical History:  Procedure Laterality Date   COLONOSCOPY WITH PROPOFOL N/A 02/11/2018   Procedure: COLONOSCOPY WITH PROPOFOL;  Surgeon: Toney Reil, MD;  Location: [facility] ENDOSCOPY;  Service: Gastroenterology;  Laterality: N/A;   NO PAST SURGERIES      Family History  Problem Relation Age of Onset   Diabetes Father    Depression Paternal Grandmother    Depression Paternal Grandfather    Depression Cousin    Breast cancer Neg Hx    Arthritis/Rheumatoid Neg Hx     Social History   Socioeconomic History   Marital status: Single    Spouse name: Not on file   Number of children: 0   Years of education: 14   Highest education level: Not on file  Occupational History   Occupation: Lawyer: [employer]    Comment: [employer]  Tobacco Use   Smoking status: Every Day    Current packs/day: 0.50    Average packs/day: 0.5 packs/day for 25.0 years (12.5 ttl pk-yrs)    Types: Cigarettes   Smokeless tobacco: Never   Tobacco comments:    Has Chantix  Substance and Sexual Activity   Alcohol use: Yes    Alcohol/week: 3.0 standard drinks of alcohol    Types: 3 Cans of beer per week   Drug use: No   Sexual activity: Yes    Birth control/protection: Condom  Other Topics Concern   Not on file  Social  History Narrative   Mary Haney was born in [place], [place]. She moved with her family to [place] at age 26. She attended 2 years at [employer] and was majoring in [place] Studies. Her goal is to become a Nurse, learning disability in the [employer]. 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 [name] lives with her boyfriend, Mary Haney. They are considering getting married soon. She enjoys sketching outdoor scenery, cartoons and loves to read.   Social Determinants of Health   Financial Resource Strain: Not on file  Food Insecurity: Not on file  Transportation Needs: Not on file  Physical Activity: Not on file  Stress: Not on file  Social Connections: Not on file  Intimate Partner Violence: Not on file     Outpatient Medications Prior to Visit  Medication Sig Dispense Refill   albuterol (PROAIR HFA) 108 (90 Base) MCG/ACT inhaler Inhale 2 puffs into the lungs every 6 (six) hours as needed for wheezing or shortness of breath. 1 each 1   B-D ULTRAFINE III SHORT PEN 31G X 8 MM MISC USE FIVE TIMES DAILY 300 each 3   cetirizine (ZYRTEC) 10 MG tablet Take 10 mg by mouth daily as needed for allergies.      cyclobenzaprine (FLEXERIL) 5  MG tablet TAKE 1 TABLET BY MOUTH THREE TIMES A DAY AS NEEDED FOR MUSCLE SPASMS 15 tablet 1   Glucagon 3 MG/DOSE POWD Place 3 mg into the nose once as needed for up to 1 dose. 1 each 11   hydrocortisone (ANUSOL-HC) 25 MG suppository Place 1 suppository (25 mg total) rectally 2 (two) times daily. 12 suppository 0   insulin aspart (NOVOLOG FLEXPEN) 100 UNIT/ML FlexPen INJECT 2-9 UNITS INTO THE SKIN 3 (THREE) TIMES DAILY WITH MEALS. 15 mL 1   insulin detemir (LEVEMIR FLEXPEN) 100 UNIT/ML FlexPen INJECT 12 UNITS INTO THE SKIN DAILY. 15 mL 1   insulin glargine-yfgn (SEMGLEE) 100 UNIT/ML Pen Inject 10-12 Units into the skin daily. 15 mL 2   Insulin Pen Needle (B-D UF III MINI PEN NEEDLES) 31G X 5 MM MISC USE 5x WHEN TAKING INSULIN. 300 each 3   Insulin Syringe-Needle  U-100 (BD INSULIN SYRINGE U/F) 30G X 1/2" 0.5 ML MISC USE 2x a day WHEN TAKING INSULIN 200 each 3   meloxicam (MOBIC) 7.5 MG tablet TAKE 1 TABLET BY MOUTH EVERY DAY AS NEEDED FOR PAIN 30 tablet 1   triamcinolone ointment (KENALOG) 0.1 % APPLY 1 APPLICATION TOPICALLY 2 TIMES DAILY AS NEEDED. LIMIT USE DUE RISK OF SKIN DISCOLORATION. 465 g 1   No facility-administered medications prior to visit.    No Known Allergies  ROS Review of Systems Negative unless indicated in HPI.    Objective:    Physical Exam Constitutional:      Appearance: Normal appearance.  Cardiovascular:     Rate and Rhythm: Normal rate and regular rhythm.     Pulses: Normal pulses.     Heart sounds: Normal heart sounds.  Pulmonary:     Effort: Pulmonary effort is normal.     Breath sounds: Normal breath sounds. No stridor. No wheezing.  Genitourinary:      Comments: Small red bump, tender to touch  Neurological:     Mental Status: She is alert.     BP 138/84   Pulse 100   Temp 98.2 F (36.8 C)   Resp 16   Ht 5\' 3"  (1.6 m)   Wt 109 lb (49.4 kg)   SpO2 99%   BMI 19.31 kg/m  Wt Readings from Last 3 Encounters:  07/14/23 109 lb (49.4 kg)  06/15/23 110 lb 6.4 oz (50.1 kg)  01/09/23 110 lb (49.9 kg)     Health Maintenance  Topic Date Due   COVID-19 Vaccine (3 - 2023-24 season) 08/08/2022   Zoster Vaccines- Shingrix (1 of 2) Never done   HEMOGLOBIN A1C  07/10/2023   Colonoscopy  10/09/2023 (Originally 02/11/2021)   INFLUENZA VACCINE  03/07/2024 (Originally 07/09/2023)   Diabetic kidney evaluation - eGFR measurement  08/19/2023   Diabetic kidney evaluation - Urine ACR  08/19/2023   OPHTHALMOLOGY EXAM  01/07/2024   PAP SMEAR-Modifier  02/09/2024   FOOT EXAM  06/14/2024   DTaP/Tdap/Td (2 - Tdap) 07/20/2024   MAMMOGRAM  03/29/2025   Hepatitis C Screening  Completed   HPV VACCINES  Aged Out    There are no preventive care reminders to display for this patient.  Lab Results  Component Value Date    TSH 1.62 08/18/2022   Lab Results  Component Value Date   WBC 4.6 02/25/2021   HGB 13.2 02/25/2021   HCT 38.7 02/25/2021   MCV 90.6 02/25/2021   PLT 383 02/25/2021   Lab Results  Component Value Date   NA 138 08/18/2022  K 4.7 08/18/2022   CO2 27 08/18/2022   GLUCOSE 124 (H) 08/18/2022   BUN 9 08/18/2022   CREATININE 0.83 08/18/2022   BILITOT 0.4 08/18/2022   ALKPHOS 54 07/25/2020   AST 21 08/18/2022   ALT 10 08/18/2022   PROT 8.2 (H) 08/18/2022   ALBUMIN 4.5 07/25/2020   CALCIUM 10.1 08/18/2022   ANIONGAP 9 04/30/2016   EGFR 86 08/18/2022   GFR 101.45 07/25/2020   Lab Results  Component Value Date   CHOL 180 08/18/2022   Lab Results  Component Value Date   HDL 118.90 08/18/2022   Lab Results  Component Value Date   LDLCALC 49 08/18/2022   Lab Results  Component Value Date   TRIG 61.0 08/18/2022   Lab Results  Component Value Date   CHOLHDL 2 08/18/2022   Lab Results  Component Value Date   HGBA1C 8.9 (A) 01/09/2023      Assessment & Plan:  There are no diagnoses linked to this encounter.  Follow-up: No follow-ups on file.   Kara Dies, NP

## 2023-07-15 NOTE — Patient Instructions (Signed)
Will treat with cephalexin. Use warm compress or sitz bath Do not shave the area until the bump heals.

## 2023-07-15 NOTE — Assessment & Plan Note (Signed)
Erythematous small bump and tender to touch Due to recurrent episode will treat with cephalexin twice a day for 7 days. Advised patient to use warm compress or sitz bath. Refrain for shaving until the area heals.

## 2023-07-16 DIAGNOSIS — H40003 Preglaucoma, unspecified, bilateral: Secondary | ICD-10-CM | POA: Diagnosis not present

## 2023-07-16 DIAGNOSIS — E119 Type 2 diabetes mellitus without complications: Secondary | ICD-10-CM | POA: Diagnosis not present

## 2023-10-01 ENCOUNTER — Other Ambulatory Visit: Payer: Self-pay | Admitting: Family

## 2023-10-01 DIAGNOSIS — L309 Dermatitis, unspecified: Secondary | ICD-10-CM

## 2023-10-29 ENCOUNTER — Ambulatory Visit: Payer: 59 | Admitting: Internal Medicine

## 2023-11-05 DIAGNOSIS — R55 Syncope and collapse: Secondary | ICD-10-CM | POA: Diagnosis not present

## 2023-11-05 DIAGNOSIS — R000 Tachycardia, unspecified: Secondary | ICD-10-CM | POA: Diagnosis not present

## 2023-11-05 DIAGNOSIS — R030 Elevated blood-pressure reading, without diagnosis of hypertension: Secondary | ICD-10-CM | POA: Diagnosis not present

## 2023-11-05 DIAGNOSIS — E162 Hypoglycemia, unspecified: Secondary | ICD-10-CM | POA: Diagnosis not present

## 2023-11-05 DIAGNOSIS — E11649 Type 2 diabetes mellitus with hypoglycemia without coma: Secondary | ICD-10-CM | POA: Diagnosis not present

## 2023-11-05 DIAGNOSIS — Z794 Long term (current) use of insulin: Secondary | ICD-10-CM | POA: Diagnosis not present

## 2023-11-05 DIAGNOSIS — Z5329 Procedure and treatment not carried out because of patient's decision for other reasons: Secondary | ICD-10-CM | POA: Diagnosis not present

## 2023-11-05 DIAGNOSIS — R569 Unspecified convulsions: Secondary | ICD-10-CM | POA: Diagnosis not present

## 2023-11-05 DIAGNOSIS — F17210 Nicotine dependence, cigarettes, uncomplicated: Secondary | ICD-10-CM | POA: Diagnosis not present

## 2023-11-05 DIAGNOSIS — R4182 Altered mental status, unspecified: Secondary | ICD-10-CM | POA: Diagnosis not present

## 2023-11-10 ENCOUNTER — Emergency Department
Admission: EM | Admit: 2023-11-10 | Discharge: 2023-11-10 | Disposition: A | Discharge status: Home / Self Care | Payer: 59 | Attending: Emergency Medicine | Admitting: Emergency Medicine

## 2023-11-10 ENCOUNTER — Emergency Department: Payer: 59

## 2023-11-10 ENCOUNTER — Ambulatory Visit: Payer: 59 | Admitting: Family

## 2023-11-10 ENCOUNTER — Encounter: Payer: Self-pay | Admitting: Family

## 2023-11-10 ENCOUNTER — Other Ambulatory Visit: Payer: Self-pay

## 2023-11-10 VITALS — BP 130/80 | HR 99 | Temp 98.3°F | Resp 17 | Ht 63.0 in | Wt 108.2 lb

## 2023-11-10 DIAGNOSIS — R000 Tachycardia, unspecified: Secondary | ICD-10-CM | POA: Diagnosis not present

## 2023-11-10 DIAGNOSIS — R9431 Abnormal electrocardiogram [ECG] [EKG]: Secondary | ICD-10-CM

## 2023-11-10 DIAGNOSIS — I10 Essential (primary) hypertension: Secondary | ICD-10-CM | POA: Diagnosis not present

## 2023-11-10 DIAGNOSIS — E1065 Type 1 diabetes mellitus with hyperglycemia: Secondary | ICD-10-CM

## 2023-11-10 DIAGNOSIS — F17210 Nicotine dependence, cigarettes, uncomplicated: Secondary | ICD-10-CM | POA: Diagnosis not present

## 2023-11-10 DIAGNOSIS — G40909 Epilepsy, unspecified, not intractable, without status epilepticus: Secondary | ICD-10-CM | POA: Insufficient documentation

## 2023-11-10 DIAGNOSIS — E119 Type 2 diabetes mellitus without complications: Secondary | ICD-10-CM | POA: Insufficient documentation

## 2023-11-10 DIAGNOSIS — R7989 Other specified abnormal findings of blood chemistry: Secondary | ICD-10-CM | POA: Insufficient documentation

## 2023-11-10 LAB — CBC
HCT: 35 % — ABNORMAL LOW (ref 36.0–46.0)
Hemoglobin: 12.1 g/dL (ref 12.0–15.0)
MCH: 32.6 pg (ref 26.0–34.0)
MCHC: 34.6 g/dL (ref 30.0–36.0)
MCV: 94.3 fL (ref 80.0–100.0)
Platelets: 296 10*3/uL (ref 150–400)
RBC: 3.71 MIL/uL — ABNORMAL LOW (ref 3.87–5.11)
RDW: 14.5 % (ref 11.5–15.5)
WBC: 4.7 10*3/uL (ref 4.0–10.5)
nRBC: 0 % (ref 0.0–0.2)

## 2023-11-10 LAB — BASIC METABOLIC PANEL
Anion gap: 13 (ref 5–15)
BUN: <5 mg/dL — ABNORMAL LOW (ref 6–20)
CO2: 30 mmol/L (ref 22–32)
Calcium: 9.4 mg/dL (ref 8.9–10.3)
Chloride: 92 mmol/L — ABNORMAL LOW (ref 98–111)
Creatinine, Ser: 0.67 mg/dL (ref 0.44–1.00)
GFR, Estimated: >60 mL/min (ref >60)
Glucose, Bld: 225 mg/dL — ABNORMAL HIGH (ref 70–99)
Potassium: 3.3 mmol/L — ABNORMAL LOW (ref 3.5–5.1)
Sodium: 135 mmol/L (ref 135–145)

## 2023-11-10 LAB — TROPONIN I (HIGH SENSITIVITY): Troponin I (High Sensitivity): 35 ng/L — ABNORMAL HIGH (ref <18)

## 2023-11-10 NOTE — Assessment & Plan Note (Signed)
Reviewed emergency room visit at [facility] health for suspected seizure.  Repeated EKG today as unable to see [facility] EKG in epic system.  Patient is asymptomatic.  Concerned as EKG reveals inferior diffuse ST segment depression.  This is new when compared to previous EKG 04/29/2016 and 06/12/2012.  She has risk factors for MI including diabetes, smoking.  Strongly advised patient to go to [facility] ED via EMS transport.  She politely and adamantly declines.  She understands the risk to her life by driving a personal vehicle. she is comfortable with her partner driving her to [facility].    I have given report to nurse triage.  Of note, advised patient to continue not driving until she is seen by neurology.  Referral has been placed.

## 2023-11-10 NOTE — Progress Notes (Signed)
Assessment & Plan:  Inferior ST segment depression Assessment & Plan: Reviewed emergency room visit at [facility] health for suspected seizure.  Repeated EKG today as unable to see [facility] EKG in epic system.  Patient is asymptomatic.  Concerned as EKG reveals inferior diffuse ST segment depression.  This is new when compared to previous EKG 04/29/2016 and 06/12/2012.  She has risk factors for MI including diabetes, smoking.  Strongly advised patient to go to [facility] ED via EMS transport.  She politely and adamantly declines.  She understands the risk to her life by driving a personal vehicle. she is comfortable with her partner driving her to [facility].    I have given report to nurse triage.  Of note, advised patient to continue not driving until she is seen by neurology.  Referral has been placed.    Tachycardia -     EKG 12-Lead  Uncontrolled type 1 diabetes mellitus with hyperglycemia, with long-term current use of insulin (HCC)  Seizure disorder (HCC) -     Ambulatory referral to Neurology     Return precautions given.   Risks, benefits, and alternatives of the medications and treatment plan prescribed today were discussed, and patient expressed understanding.   Education regarding symptom management and diagnosis given to patient on AVS either electronically or printed.  Return in about 2 weeks (around 11/24/2023).  Rennie Plowman, FNP  Subjective:    Patient ID: Mary Haney, female    DOB: 08/16/1971, 52 y.o.   MRN: [id number]  CC: Mary Haney is a 52 y.o. female who presents today for follow up.   HPI: Accompanied by significant other, Kacey.   Prior to syncopal episode 5 days ago, she had felt 'woozy' and she had a half chicken wing. Next memory, she was in the ambulance.   Urinated on herself and bit her tongue.   She has some fatigue the past couple of days.   Tongue pain is improving and she was able to eat solid food.   FBG have been averaging 200- 213 which is  at baseline for her.   Denies CP, palpitations, SOB, left arm numbness, jaw pain, nausea.   Denies drug use, alcohol use.    ED follow-up Patient states that family witnessed her passing out with generalized shaking.  She bit her tongue. Blood sugar 39.   She left AMA.  Attending was planning hospital admission.   Seen 11/05/2023 for [facility] for syncopal episode while eating.  Family reports possible seizure activity   Liver enzymes elevated, glucose 38.  Potassium 3.2 .  Urine protein elevated.  Given IV fluids in the emergency room   CT head obtained without abnormalities.  EKG sinus tachycardia  Advised not to drive until evaluated by neurology  Type 1 diabetes, continues to follow Dr. Hal Morales  No previous cardiology evaluation   Allergies: Patient has no known allergies. Current Outpatient Medications on File Prior to Visit  Medication Sig Dispense Refill   albuterol (PROAIR HFA) 108 (90 Base) MCG/ACT inhaler Inhale 2 puffs into the lungs every 6 (six) hours as needed for wheezing or shortness of breath. 1 each 1   B-D ULTRAFINE III SHORT PEN 31G X 8 MM MISC USE FIVE TIMES DAILY 300 each 3   cetirizine (ZYRTEC) 10 MG tablet Take 10 mg by mouth daily as needed for allergies.      Glucagon 3 MG/DOSE POWD Place 3 mg into the nose once as needed for up to 1 dose. 1 each 11  hydrocortisone (ANUSOL-HC) 25 MG suppository Place 1 suppository (25 mg total) rectally 2 (two) times daily. 12 suppository 0   insulin aspart (NOVOLOG FLEXPEN) 100 UNIT/ML FlexPen INJECT 2-9 UNITS INTO THE SKIN 3 (THREE) TIMES DAILY WITH MEALS. 15 mL 1   insulin detemir (LEVEMIR FLEXPEN) 100 UNIT/ML FlexPen INJECT 12 UNITS INTO THE SKIN DAILY. 15 mL 1   Insulin Pen Needle (B-D UF III MINI PEN NEEDLES) 31G X 5 MM MISC USE 5x WHEN TAKING INSULIN. 300 each 3   Insulin Syringe-Needle U-100 (BD INSULIN SYRINGE U/F) 30G X 1/2" 0.5 ML MISC USE 2x a day WHEN TAKING INSULIN 200 each 3   triamcinolone ointment  (KENALOG) 0.1 % APPLY 1 APPLICATION TOPICALLY 2 TIMES DAILY AS NEEDED. LIMIT USE DUE RISK OF SKIN DISCOLORATION. 80 g 11   No current facility-administered medications on file prior to visit.    Review of Systems  Constitutional:  Positive for fatigue. Negative for chills and fever.  Respiratory:  Negative for cough.   Cardiovascular:  Negative for chest pain and palpitations.  Gastrointestinal:  Negative for nausea and vomiting.      Objective:    BP 130/80   Pulse 99   Temp 98.3 F (36.8 C) (Oral)   Resp 17   Ht 5\' 3"  (1.6 m)   Wt 108 lb 4 oz (49.1 kg)   SpO2 100%   BMI 19.18 kg/m  BP Readings from Last 3 Encounters:  11/10/23 130/80  07/14/23 138/84  06/15/23 138/80   Wt Readings from Last 3 Encounters:  11/10/23 108 lb 4 oz (49.1 kg)  07/14/23 109 lb (49.4 kg)  06/15/23 110 lb 6.4 oz (50.1 kg)    Physical Exam Vitals reviewed.  Constitutional:      Appearance: She is well-developed.  Eyes:     Conjunctiva/sclera: Conjunctivae normal.  Cardiovascular:     Rate and Rhythm: Normal rate and regular rhythm.     Pulses: Normal pulses.     Heart sounds: Normal heart sounds.  Pulmonary:     Effort: Pulmonary effort is normal.     Breath sounds: Normal breath sounds. No wheezing, rhonchi or rales.  Skin:    General: Skin is warm and dry.  Neurological:     Mental Status: She is alert.  Psychiatric:        Speech: Speech normal.        Behavior: Behavior normal.        Thought Content: Thought content normal.

## 2023-11-10 NOTE — ED Provider Notes (Signed)
[facility] Provider Note    Event Date/Time   First MD Initiated Contact with Patient 11/10/23 1135     (approximate)   History   Abnormal ECG   HPI  Kalei Hawksley is a 52 y.o. female who presents from her PCP with reports of abnormal EKG.  Patient reports she was following up with PCP after having first-time seizure seen in the emergency department at outside hospital last week.  No further seizure activity.  No chest pain or shortness of breath, feels well overall and has no complaints.  No history of CAD, she does smoke cigarettes.  She is diabetic and has high blood pressure     Physical Exam   Triage Vital Signs: ED Triage Vitals [11/10/23 1134]  Encounter Vitals Group     BP (!) 144/88     Systolic BP Percentile      Diastolic BP Percentile      Pulse Rate (!) 101     Resp 18     Temp 98.1 F (36.7 C)     Temp Source Oral     SpO2 99 %     Weight 49.1 kg (108 lb 4 oz)     Height 1.6 m (5\' 3" )     Head Circumference      Peak Flow      Pain Score 0     Pain Loc      Pain Education      Exclude from Growth Chart     Most recent vital signs: Vitals:   11/10/23 1134  BP: (!) 144/88  Pulse: (!) 101  Resp: 18  Temp: 98.1 F (36.7 C)  SpO2: 99%     General: Awake, no distress.  CV:  Good peripheral perfusion.  Regular rate and rhythm Resp:  Normal effort.  Clear to auscultation bilaterally Abd:  No distention.  Other:  No calf pain or swelling   ED Results / Procedures / Treatments   Labs (all labs ordered are listed, but only abnormal results are displayed) Labs Reviewed  BASIC METABOLIC PANEL - Abnormal; Notable for the following components:      Result Value   Potassium 3.3 (*)    Chloride 92 (*)    Glucose, Bld 225 (*)    BUN <5 (*)    All other components within normal limits  CBC - Abnormal; Notable for the following components:   RBC 3.71 (*)    HCT 35.0 (*)    All other components within normal limits   TROPONIN I (HIGH SENSITIVITY) - Abnormal; Notable for the following components:   Troponin I (High Sensitivity) 35 (*)    All other components within normal limits  POC URINE PREG, ED  TROPONIN I (HIGH SENSITIVITY)     EKG  ED ECG REPORT I, Jene Every, the attending physician, personally viewed and interpreted this ECG.  Date: 11/10/2023  Rhythm: normal sinus rhythm QRS Axis: normal Intervals: normal ST/T Wave abnormalities: ST depressions inferiorly and laterally     RADIOLOGY Chest x-ray viewed interpreted by me, no acute abnormality    PROCEDURES:  Critical Care performed:   Procedures   MEDICATIONS ORDERED IN ED: Medications - No data to display   IMPRESSION / MDM / ASSESSMENT AND PLAN / ED COURSE  I reviewed the triage vital signs and the nursing notes. Patient's presentation is most consistent with acute presentation with potential threat to life or bodily function.  Patient presents with abnormal EKG  in the setting of diabetes hypertension, smoking history.  Overall she is well-appearing here in no acute distress.  Currently asymptomatic.  Will send labs including high system troponin, obtain chest x-ray and reevaluate  High sensitive troponin is mildly elevated otherwise lab work is reassuring.  Discussed with cardiology Dr. Melton Alar who agrees with discharge with close follow-up in her office, discussed with patient and she agrees with this plan, strict return precautions discussed        FINAL CLINICAL IMPRESSION(S) / ED DIAGNOSES   Final diagnoses:  Abnormal EKG     Rx / DC Orders   ED Discharge Orders     None        Note:  This document was prepared using Dragon voice recognition software and may include unintentional dictation errors.   Jene Every, MD 11/10/23 [phone]

## 2023-11-10 NOTE — ED Triage Notes (Signed)
First nurse note:  Received call from [facility] PCP, sending pt to ED For abnormal EKG. Reports pt is asymptomatic.

## 2023-11-10 NOTE — ED Triage Notes (Signed)
Pt sent from Dr. Jeanelle Malling office for an abnormal EKG. Pt sts that she was there due to having a seizure last week. Pt sts that she does not have any complaints of CP,SOB, weakness or abd pain at this time.

## 2023-11-10 NOTE — Patient Instructions (Signed)
Please go directly to [facility] emergency room.    I gone ahead and placed a referral to neurology.  Please continue not to drive until evaluation is complete  Let us know if you dont hear back within a week in regards to an appointment being scheduled.   So that you are aware, if you are [facility] MyChart user , please pay attention to your MyChart messages as you may receive a MyChart message with a phone number to call and schedule this test/appointment own your own from our referral coordinator. This is a new process so I do not want you to miss this message.  If you are not a MyChart user, you will receive a phone call.

## 2023-11-12 ENCOUNTER — Encounter: Payer: Self-pay | Admitting: Neurology

## 2023-11-13 DIAGNOSIS — R002 Palpitations: Secondary | ICD-10-CM | POA: Diagnosis not present

## 2023-11-13 DIAGNOSIS — Z09 Encounter for follow-up examination after completed treatment for conditions other than malignant neoplasm: Secondary | ICD-10-CM | POA: Diagnosis not present

## 2023-11-13 DIAGNOSIS — R9431 Abnormal electrocardiogram [ECG] [EKG]: Secondary | ICD-10-CM | POA: Diagnosis not present

## 2023-11-20 ENCOUNTER — Ambulatory Visit: Payer: 59 | Admitting: Internal Medicine

## 2023-11-20 ENCOUNTER — Encounter: Payer: Self-pay | Admitting: Internal Medicine

## 2023-11-20 ENCOUNTER — Other Ambulatory Visit: Payer: Self-pay | Admitting: Internal Medicine

## 2023-11-20 VITALS — BP 138/90 | HR 113 | Ht 63.0 in | Wt 105.0 lb

## 2023-11-20 DIAGNOSIS — E162 Hypoglycemia, unspecified: Secondary | ICD-10-CM

## 2023-11-20 DIAGNOSIS — E1065 Type 1 diabetes mellitus with hyperglycemia: Secondary | ICD-10-CM | POA: Diagnosis not present

## 2023-11-20 DIAGNOSIS — R569 Unspecified convulsions: Secondary | ICD-10-CM | POA: Diagnosis not present

## 2023-11-20 DIAGNOSIS — E049 Nontoxic goiter, unspecified: Secondary | ICD-10-CM | POA: Diagnosis not present

## 2023-11-20 LAB — POCT GLYCOSYLATED HEMOGLOBIN (HGB A1C): Hemoglobin A1C: 6.7 % — AB (ref 4.0–5.6)

## 2023-11-20 MED ORDER — DEXCOM G7 SENSOR MISC: 3.0000 | 4 refills | Status: DC

## 2023-11-20 MED ORDER — GLUCAGON 3 MG/DOSE NA POWD: 3.0000 mg | Freq: Once | NASAL | 11 refills | Status: DC | PRN

## 2023-11-20 NOTE — Patient Instructions (Addendum)
Please the following regimen: - Levemir 4 units in a.m. and 6 units at bedtime - Novolog - ICR 1:13 for starches (including crackers) for dinner but 1:15 otherwise  Bolus 10-15 minutes before each meal. Bolus 1 unit for coffee.  Please use the insulin to carb ratios to calculate the insulin boluses.  Please come back for a follow-up appointment in 1.5 months.

## 2023-11-20 NOTE — Progress Notes (Unsigned)
Patient ID: Mary Haney, female   DOB: Apr 16, 1971, 52 y.o.   MRN: [id number]  HPI: Mary Haney is a 52 y.o.-year-old female, returning for f/u for DM, dx 2013, insulin-dependent, uncontrolled, without long term complications. Last visit was 5 months ago. She is here with her partner.  Interim history: She continues to be very active at work.  She occasionally skips meals. No increased urination, blurry vision, nausea. Last year, she stopped her Dexcom G6 sensor due to skin irritation.  The freestyle libre sensor was not approved for her.  She tried the Skin Tac/Tegaderm but did not tolerate it.  Since last visit, she had an episode of hypoglycemia (? 20s, 38, 39 -11/05/2023) during which she was noted to have unusual behavior and then have seizure activity while eating with her family.  She was brought to the emergency room and blood sugar was corrected.  She checked out AMA due to lack of insurance coverage of possible admission.  She cannot drive yet-appointment with neurology pending next month. More recently, on 11/10/2023 she was in the emergency room with pain and shortness of breath.  She had an abnormal EKG and elevated troponin.  She has seen cardiology since then.  Ischemia was ruled out.  2D echo is pending. Her boyfriend describes that patient has been under a lot of stress after being fired in 08/2023 after a change in management at work.  She was switched to [employer] after the first of the year.  Reviewed history: In 07/2012 she went to the ED because she was not feeling well. She was found to have a blood glucose level of 600 + and was admitted with DKA. She was followed by Dr. Tedd Sias immediately following initial diagnosis, then switched to [facility] endocrinology.   In 10/2013, due to lack of finances, she was stretching the insulin doses >> sugars high >> low CBG 25-26 >> ED.    She was admitted in 04/2016 for DKA.  At the end of 2017, she met the DM educator and  nutritionist >> decided for Omnipod pump >> but nobody called her back and then she could not afford it anymore.  She decided against that insulin pump, afterwards.  Reviewed HbA1c levels: Lab Results  Component Value Date   HGBA1C 6.5 06/15/2023   HGBA1C 8.9 (A) 01/09/2023   HGBA1C 7.4 (A) 08/18/2022   HGBA1C 6.3 (A) 04/21/2022   HGBA1C 6.3 (A) 12/16/2021   HGBA1C 6.1 (A) 07/29/2021   HGBA1C 6.5 (A) 03/18/2021   HGBA1C 7.1 (A) 12/17/2020   HGBA1C 7.9 (H) 07/25/2020   HGBA1C 8.8 (A) 04/02/2020   HGBA1C 8.7 (A) 12/27/2019   HGBA1C 6.9 (A) 09/20/2019   HGBA1C 8.5 (A) 06/14/2019   HGBA1C 9.1 (A) 02/15/2019   HGBA1C 7.4 (A) 06/22/2018   HGBA1C 7.7 03/18/2018   HGBA1C 7.9 12/17/2017   HGBA1C 7.9 09/15/2017   HGBA1C 8.1 04/02/2017   HGBA1C 9.0 11/04/2016   Pt is on a regimen of: - Levemir 3 units in a.m. and 5 >> 6 units at bedtime >> 5 units in am and 6 units at night >> 3-4 units in a.m. and 6 units at night >> now 3 in am and 4-5 at night - Novolog - ICR 1:13 for starches (including crackers) and with dinner but use 1:15 otherwise (2-3 units - estimating) Bolus 10-15 minutes before each meal. Bolus 1 unit for coffee.  She checks her sugars more than 4 times a day per review of her meter downloads: -  am: 210-270 >> 180-250, 300s >> 108-210 >> 58, 170-250, 314 - 2h after b'fast: n/c (2 units for b'fast) >> 129-367 - lunch: 60-170s >> 170-180 >> 40, 68-80 >> 76, 218-286, 310 - 2h after lunch: n/c (2-3 units for lunch) >> n/c >> 107-286, 401 - dinner: 40, 60-170 >> 90-130 >> 180-200 (fig newtons) >> 65-380 - 2h after dinner: n/c >> 83-481 - bedtime: <200 >> 180-200 >> <250 >> 180-220 >> 157-397 - nighttime: 163-334  Previously:   Initially:    Lowest sugar was  37!!! >> 40 >> 40 (skipping meals) >> 47 >> 38; she has hypoglycemia awareness in the 60s. Highest sugar was 400 >> ... 200s >> 340 (ate but did not take insulin) >> 300 >> 350 >> 400s.  Has Reli[employer] meter.  Pt's  meals are: - coffee + cream + stevia right after she wakes up at 6:45 - Breakfast: oatmeal, yoghurt, sausage bisquit - - Lunch: salad, soup, sandwiches  - Dinner: meat + starch + vegetables - Snacks: yoghurt, peanut crackers, nuts  She is very busy at work and does not have time allotted for lunch. She continues to grab lunch to go. She cannot inject insulin 15 minutes before lunch many times.  -No CKD: Lab Results  Component Value Date   BUN <5 (L) 11/10/2023   Lab Results  Component Value Date   CREATININE 0.67 11/10/2023   Lab Results  Component Value Date   MICRALBCREAT 3.8 08/18/2022   MICRALBCREAT 40 (H) 02/25/2021   MICRALBCREAT 1.8 07/25/2020   MICRALBCREAT 1.4 06/14/2019   MICRALBCREAT 0.7 09/15/2017   MICRALBCREAT 1.3 05/07/2016   MICRALBCREAT 2.8 04/03/2015   MICRALBCREAT 1.0 07/20/2014   -She has a history of dyslipidemia: Lab Results  Component Value Date   CHOL 180 08/18/2022   HDL 118.90 08/18/2022   LDLCALC 49 08/18/2022   LDLDIRECT 59.0 06/14/2019   TRIG 61.0 08/18/2022   CHOLHDL 2 08/18/2022   - last eye exam was in 01/06/2023: No DR  - no numbness and tingling in her feet.  Last foot exam 06/15/2023.  Latest TSH was normal: Lab Results  Component Value Date   TSH 1.62 08/18/2022   Her thyroid remains palpable.  Pt denies: - feeling nodules in neck - hoarseness - dysphagia - choking  ROS: + see HPI  I reviewed pt's medications, allergies, PMH, social hx, family hx, and changes were documented in the history of present illness. Otherwise, unchanged from my initial visit note.  Past Medical History:  Diagnosis Date   Alcoholic pancreatitis    Asthma    Has albuterol inhaler   Diabetes mellitus without complication (HCC)    Type 2 diagnosed 2013   Past Surgical History:  Procedure Laterality Date   COLONOSCOPY WITH PROPOFOL N/A 02/11/2018   Procedure: COLONOSCOPY WITH PROPOFOL;  Surgeon: Toney Reil, MD;  Location: [facility]  ENDOSCOPY;  Service: Gastroenterology;  Laterality: N/A;   NO PAST SURGERIES     History   Social History   Marital Status: Single    Spouse Name: N/A    Number of Children: 0   Years of Education: 14   Occupational History   [employer]ist     [employer]   Social History Main Topics   Smoking status: Current Every Day Smoker -- 0.50 packs/day for 25 years    Types: Cigarettes   Smokeless tobacco: Never Used   Alcohol Use: 1.8 oz/week    3 Cans of beer per week  Drug Use: No   Sexual Activity: Yes    Birth Control/ Protection: Condom   Social History Narrative   Claresa was born in [place], [place]. She moved with her family to [place] at age 16. She attended 2 years at [employer] and was majoring in [place] Studies. Her goal is to become a Nurse, learning disability in the [employer]. 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 Ebelin lives with her boyfriend, Vilinda Blanks. They are considering getting married soon. She enjoys sketching outdoor scenery, cartoons and loves to read.   Current Outpatient Medications on File Prior to Visit  Medication Sig Dispense Refill   albuterol (PROAIR HFA) 108 (90 Base) MCG/ACT inhaler Inhale 2 puffs into the lungs every 6 (six) hours as needed for wheezing or shortness of breath. 1 each 1   B-D ULTRAFINE III SHORT PEN 31G X 8 MM MISC USE FIVE TIMES DAILY 300 each 3   cetirizine (ZYRTEC) 10 MG tablet Take 10 mg by mouth daily as needed for allergies.      Glucagon 3 MG/DOSE POWD Place 3 mg into the nose once as needed for up to 1 dose. 1 each 11   hydrocortisone (ANUSOL-HC) 25 MG suppository Place 1 suppository (25 mg total) rectally 2 (two) times daily. 12 suppository 0   insulin aspart (NOVOLOG FLEXPEN) 100 UNIT/ML FlexPen INJECT 2-9 UNITS INTO THE SKIN 3 (THREE) TIMES DAILY WITH MEALS. 15 mL 1   insulin detemir (LEVEMIR FLEXPEN) 100 UNIT/ML FlexPen INJECT 12 UNITS INTO THE SKIN DAILY. 15 mL 1   Insulin Pen Needle (B-D UF III  MINI PEN NEEDLES) 31G X 5 MM MISC USE 5x WHEN TAKING INSULIN. 300 each 3   Insulin Syringe-Needle U-100 (BD INSULIN SYRINGE U/F) 30G X 1/2" 0.5 ML MISC USE 2x a day WHEN TAKING INSULIN 200 each 3   triamcinolone ointment (KENALOG) 0.1 % APPLY 1 APPLICATION TOPICALLY 2 TIMES DAILY AS NEEDED. LIMIT USE DUE RISK OF SKIN DISCOLORATION. 80 g 11   No current facility-administered medications on file prior to visit.   No Known Allergies Family History  Problem Relation Age of Onset   Diabetes Father    Depression Paternal Grandmother    Depression Paternal Grandfather    Depression Cousin    Breast cancer Neg Hx    Arthritis/Rheumatoid Neg Hx    PE: BP (!) 138/90   Pulse (!) 113   Ht 5\' 3"  (1.6 m)   Wt 105 lb (47.6 kg)   SpO2 99%   BMI 18.60 kg/m  Wt Readings from Last 3 Encounters:  11/20/23 105 lb (47.6 kg)  11/10/23 108 lb 4 oz (49.1 kg)  11/10/23 108 lb 4 oz (49.1 kg)   Constitutional: thin, in NAD Eyes: EOMI, no exophthalmos ENT: + slight, symmetric thyromegaly, no cervical lymphadenopathy Cardiovascular: Tachycardia, RR, No MRG Respiratory: CTA B Musculoskeletal: no deformities Skin: no rashes Neurological: no tremor with outstretched hands  ASSESSMENT: 1. DM, insulin-dependent, uncontrolled, with complications She - History of DKA  Component     Latest Ref Rng & Units 11/04/2016  Hemoglobin A1C      9.0  C-Peptide     0.80 - 3.85 ng/mL <0.10 (L)  Glucose, Fasting     65 - 99 mg/dL 40 (L)  Glutamic Acid Decarb Ab     <5 IU/mL >250 (H)  Pancreatic Islet Cell Antibody     <5 JDF Units <5   Glucose very low at the time of the  blood draw, therefore, C-peptide is not interpretable. However, GAD antibodies are undetectably high, confirming type 1 diabetes.  2.  Palpable thyroid  PLAN:  1. Patient with history of uncontrolled type 1 diabetes, basal-bolus insulin regimen with increased insulin sensitivity, managed with low insulin doses.  She was on a CGM before  but she had skin irritation with the Dexcom G6.  I recommended Tegaderm and we also gave her a sample of Dexcom G7 to see if she could tolerate it but she lost it.  At last visit she was still off CGM.  At that time, sugars were slightly better in the morning but they were quite fluctuating probably due to high blood sugars at bedtime.  She also had higher blood sugars before dinner.  She was eating fig newtons before dinner and sugars increased drastically afterwards.  I strongly advised her to stop these and discussed about healthier and lower carb snacks.  She was dropping her sugars too much midday especially if she delayed a meal and upon questioning she was taking a higher dose of Levemir in a.m. then recommended.  I advised her to back off the dose. -Since last visit she had a severe hypoglycemia episode, with blood sugars in the 30s and had a seizure.  She was taken to the emergency room but had to leave AMA due to lack of insurance coverage. -At today's visit, we discussed that it is vital for her to start on a CGM, which could have alerted her about the drop in blood sugars before they dropped as low as they did.  We gave her another sample of the Dexcom G7 and I strongly advised her to let me know if this is not covered by her insurance (I sent it to the pharmacy). -At today's visit, she is using lower doses of Levemir and then recommended and, based on the blood sugars which are very high, up to 400s frequently, she is missing insulin doses.  She confirms that she is afraid to bolus due to the recent seizure episode.  I advised her that she absolutely needs to do so and we are actually going to increase the dose of Levemir.  I also advised her to try to guide the dose of NovoLog based on carbs, which she is now not doing, but prefers to estimate the carbs.  We also discussed about timing of insulin-recommended to inject 15 minutes before meal, rather than as she was eating or afterwards, to avoid  hypoglycemia. -Reviewing the ED note from 10/2023, it was noted that she was drinking 12 beers a week.  We discussed about the fact that alcohol can lead to hypoglycemia.   -I also refilled her glucagon. -I advised her to let me know if she has any more hypoglycemic episodes -  I suggested to:  Patient Instructions  Please the following regimen: - Levemir 4 units in a.m. and 6 units at bedtime - Novolog - ICR 1:13 for starches (including crackers) for dinner but 1:15 otherwise  Bolus 10-15 minutes before each meal. Bolus 1 unit for coffee.  Please use the insulin to carb ratios to calculate the insulin boluses.  Please come back for a follow-up appointment in 1.5 months.  - we checked her HbA1c: 6.7% (much lower than expected) - advised to check sugars at different times of the day - 4 x a day, rotating check times - advised for yearly eye exams >> she is UTD -Will check annual labs today - return to clinic  in 1.5 months  2.  Palpable thyroid -Thyroid is symmetric and nonnodular on palpation -No neck compression symptoms -TSH was normal at last check, Lab Results  Component Value Date   TSH 1.62 08/18/2022  -Will recheck this today -We will continue to follow her clinically.  Carlus Pavlov, MD PhD [facility]

## 2023-11-21 LAB — LIPID PANEL
Cholesterol: 181 mg/dL (ref <200)
HDL: 95 mg/dL (ref >=50)
Non-HDL Cholesterol (Calc): 86 mg/dL (ref <130)
Total CHOL/HDL Ratio: 1.9 (calc) (ref <5.0)
Triglycerides: 475 mg/dL — ABNORMAL HIGH (ref <150)

## 2023-11-21 LAB — MICROALBUMIN / CREATININE URINE RATIO
Creatinine, Urine: 124 mg/dL (ref 20–275)
Microalb Creat Ratio: 152 mg/g{creat} — ABNORMAL HIGH (ref <30)
Microalb, Ur: 18.8 mg/dL

## 2023-11-21 LAB — TSH: TSH: 0.82 m[IU]/L

## 2023-11-23 NOTE — Telephone Encounter (Signed)
Change to Gvoke ?

## 2023-11-25 MED ORDER — GVOKE HYPOPEN 1-PACK 1 MG/0.2ML ~~LOC~~ SOAJ: 1.0000 mg | SUBCUTANEOUS | 2 refills | Status: DC | PRN

## 2023-11-25 NOTE — Addendum Note (Signed)
Addended by: Pollie Meyer on: 11/25/2023 03:41 PM   Modules accepted: Orders

## 2023-11-26 ENCOUNTER — Other Ambulatory Visit: Payer: Self-pay | Admitting: Internal Medicine

## 2023-11-29 ENCOUNTER — Telehealth: Payer: Self-pay

## 2023-11-29 NOTE — Telephone Encounter (Signed)
Message has been sent to the PA team,

## 2023-11-29 NOTE — Telephone Encounter (Signed)
Please check to see which Glucagon is preferred by the patients insurance

## 2023-11-30 ENCOUNTER — Other Ambulatory Visit (HOSPITAL_COMMUNITY): Payer: Self-pay

## 2023-11-30 ENCOUNTER — Telehealth: Payer: Self-pay | Admitting: Pharmacy Technician

## 2023-11-30 NOTE — Telephone Encounter (Signed)
Pharmacy Patient Advocate Encounter  Insurance verification completed.    The patient is insured through [employer] test claim for Glucagon, Baqsimi and Gvoke. Generic glucagon emergency kit is the only one covered and it's $0 copay.  This test claim was processed through [facility]- copay amounts may vary at other pharmacies due to pharmacy/plan contracts, or as the patient moves through the different stages of their insurance plan.

## 2023-12-03 MED ORDER — GLUCAGON 3 MG/DOSE NA POWD: 3.0000 mg | Freq: Once | NASAL | 11 refills | Status: DC | PRN

## 2023-12-03 NOTE — Telephone Encounter (Signed)
Requested Prescriptions   Signed Prescriptions Disp Refills   Glucagon 3 MG/DOSE POWD 1 each 11    Sig: Place 3 mg into the nose once as needed for up to 1 dose. [id number]    Authorizing Provider: Carlus Pavlov    Ordering User: Valorie Roosevelt S   NDC added to the sig and note to pharmacy.

## 2023-12-10 ENCOUNTER — Telehealth: Payer: Self-pay

## 2023-12-10 DIAGNOSIS — L309 Dermatitis, unspecified: Secondary | ICD-10-CM

## 2023-12-10 MED ORDER — TRIAMCINOLONE ACETONIDE 0.1 % EX OINT: TOPICAL_OINTMENT | CUTANEOUS | 0 refills | Status: DC

## 2023-12-10 NOTE — Addendum Note (Signed)
 Addended by: Allegra Grana on: 12/10/2023 04:52 PM   Modules accepted: Orders

## 2023-12-10 NOTE — Telephone Encounter (Signed)
 Copied from CRM [id number]. Topic: Clinical - Prescription Issue >> Dec 10, 2023  1:27 PM Mary Haney wrote: Reason for CRM: Patient called in because she would like her prescription triamcinolone  ointment (KENALOG ) 0.1 % to come in a jar instead of a tube because its easier for her to use. Is asking for it to be sent to the pharmacy that is on file. She said that the pharmacy told her that the provider has to change the way it was dispensed.

## 2023-12-10 NOTE — Telephone Encounter (Signed)
 Noted I have re sent in a jar

## 2023-12-11 NOTE — Telephone Encounter (Signed)
 Pt has been notified.

## 2023-12-23 ENCOUNTER — Ambulatory Visit: Payer: 59 | Admitting: Neurology

## 2024-01-01 ENCOUNTER — Telehealth: Payer: Self-pay

## 2024-01-01 MED ORDER — DEXCOM G7 SENSOR MISC: 3.0000 | 4 refills | Status: DC

## 2024-01-01 NOTE — Telephone Encounter (Signed)
Requested Prescriptions   Signed Prescriptions Disp Refills   Continuous Glucose Sensor (DEXCOM G7 SENSOR) MISC 9 each 4    Sig: 3 each by Does not apply route every 30 (thirty) days. Apply 1 sensor every 10 days    Authorizing Provider: Carlus Pavlov    Ordering User: Pollie Meyer

## 2024-01-06 ENCOUNTER — Ambulatory Visit: Payer: 59 | Admitting: Internal Medicine

## 2024-01-08 ENCOUNTER — Telehealth: Payer: Self-pay

## 2024-01-11 NOTE — Telephone Encounter (Signed)
Pt needs a PA for Dexcom G7

## 2024-01-12 ENCOUNTER — Telehealth: Payer: Self-pay | Admitting: Pharmacy Technician

## 2024-01-12 ENCOUNTER — Other Ambulatory Visit (HOSPITAL_COMMUNITY): Payer: Self-pay

## 2024-01-12 ENCOUNTER — Telehealth: Payer: Self-pay | Admitting: Internal Medicine

## 2024-01-12 NOTE — Telephone Encounter (Signed)
Patient came by and picked up Dexcom G7. Jasmine had talked to her Friday 01/08/24

## 2024-01-12 NOTE — Telephone Encounter (Signed)
PA request has been Submitted. New Encounter created for follow up. For additional info see Pharmacy Prior Auth telephone encounter from 01/12/24.

## 2024-01-12 NOTE — Telephone Encounter (Signed)
 Pharmacy Patient Advocate Encounter   Received notification from Pt Calls Messages that prior authorization for Dexcom G7 Sensor is required/requested.   Insurance verification completed.   The patient is insured through [facility] .   Per test claim: PA required; PA submitted to above mentioned insurance via CoverMyMeds Key/confirmation #/[id number] Status is pending

## 2024-01-19 ENCOUNTER — Encounter: Payer: Self-pay | Admitting: Internal Medicine

## 2024-01-19 ENCOUNTER — Telehealth: Payer: Self-pay

## 2024-01-19 ENCOUNTER — Ambulatory Visit (INDEPENDENT_AMBULATORY_CARE_PROVIDER_SITE_OTHER): Payer: 59 | Admitting: Internal Medicine

## 2024-01-19 VITALS — BP 130/86 | HR 121 | Ht 63.0 in | Wt 104.0 lb

## 2024-01-19 DIAGNOSIS — E049 Nontoxic goiter, unspecified: Secondary | ICD-10-CM | POA: Diagnosis not present

## 2024-01-19 DIAGNOSIS — E1065 Type 1 diabetes mellitus with hyperglycemia: Secondary | ICD-10-CM | POA: Diagnosis not present

## 2024-01-19 LAB — POCT GLYCOSYLATED HEMOGLOBIN (HGB A1C): Hemoglobin A1C: 6.4 % — AB (ref 4.0–5.6)

## 2024-01-19 LAB — GLUCOSE, POCT (MANUAL RESULT ENTRY)
POC Glucose: 69 mg/dL — AB (ref 70–99)
POC Glucose: 89 mg/dL (ref 70–99)

## 2024-01-19 MED ORDER — LEVEMIR FLEXPEN 100 UNIT/ML ~~LOC~~ SOPN: 12.0000 [IU] | PEN_INJECTOR | Freq: Every day | SUBCUTANEOUS | Status: DC

## 2024-01-19 MED ORDER — NOVOLOG FLEXPEN 100 UNIT/ML ~~LOC~~ SOPN: 2.0000 [IU] | PEN_INJECTOR | Freq: Three times a day (TID) | SUBCUTANEOUS | Status: DC

## 2024-01-19 NOTE — Progress Notes (Signed)
Patient ID: Mary Haney, female   DOB: Aug 29, 1971, 53 y.o.   MRN: [id number]  HPI: Mary Haney is a 53 y.o.-year-old female, returning for f/u for DM, dx 2013, insulin-dependent, uncontrolled, without long term complications. Last visit was 2 months ago.   Interim history: No increased urination, blurry vision, nausea. Before  last visit, she had an episode of hypoglycemia (? 52, 38, 39 -11/05/2023) during which she was noted to have unusual behavior and then have seizure activity while eating with her family.  She was brought to the emergency room and blood sugar was corrected.   Patient had a lot of stress after being fired in 08/2023 after a change in management at work.  She now has [employer].  She is still out of work. She is feeling better compared to last visit.  However, she had hypoglycemia at today's visit, 69 on the glucometer.  Given apple juice.  Blood sugars increased to 89.  Reviewed history: In 07/2012 she went to the ED because she was not feeling well. She was found to have a blood glucose level of 600 + and was admitted with DKA. She was followed by Dr. Tedd Sias immediately following initial diagnosis, then switched to [facility] endocrinology.   In 10/2013, due to lack of finances, she was stretching the insulin doses >> sugars high >> low CBG 25-26 >> ED.    She was admitted in 04/2016 for DKA.  At the end of 2017, she met the DM educator and nutritionist >> decided for Omnipod pump >> but nobody called her back and then she could not afford it anymore.  She decided against that insulin pump, afterwards.  Reviewed HbA1c levels: Lab Results  Component Value Date   HGBA1C 6.7 (A) 11/20/2023   HGBA1C 6.5 06/15/2023   HGBA1C 8.9 (A) 01/09/2023   HGBA1C 7.4 (A) 08/18/2022   HGBA1C 6.3 (A) 04/21/2022   HGBA1C 6.3 (A) 12/16/2021   HGBA1C 6.1 (A) 07/29/2021   HGBA1C 6.5 (A) 03/18/2021   HGBA1C 7.1 (A) 12/17/2020   HGBA1C 7.9 (H) 07/25/2020   HGBA1C 8.8 (A)  04/02/2020   HGBA1C 8.7 (A) 12/27/2019   HGBA1C 6.9 (A) 09/20/2019   HGBA1C 8.5 (A) 06/14/2019   HGBA1C 9.1 (A) 02/15/2019   HGBA1C 7.4 (A) 06/22/2018   HGBA1C 7.7 03/18/2018   HGBA1C 7.9 12/17/2017   HGBA1C 7.9 09/15/2017   HGBA1C 8.1 04/02/2017   Pt is on a regimen of: - Levemir 3 in am and 4-5 at night >> I recommended 4 units in a.m. and 6 units at bedtime >> but currently on 3 units 2x a day - Novolog - ICR 1:13 for starches (including crackers) and with dinner but use 1:15 otherwise (2-3 units - estimating) Bolus 10-15 minutes before each meal. Bolus 1 unit for coffee.  She checks her sugars more than 4 times a day per review of her CGM:   Prev.: - am: 210-270 >> 180-250, 300s >> 108-210 >> 58, 170-250, 314 - 2h after b'fast: n/c (2 units for b'fast) >> 129-367 - lunch: 60-170s >> 170-180 >> 40, 68-80 >> 76, 218-286, 310 - 2h after lunch: n/c (2-3 units for lunch) >> n/c >> 107-286, 401 - dinner: 40, 60-170 >> 90-130 >> 180-200 (fig newtons) >> 65-380 - 2h after dinner: n/c >> 83-481 - bedtime: <200 >> 180-200 >> <250 >> 180-220 >> 157-397 - nighttime: 163-334  Previously:   Initially:    Lowest sugar was  37!!! ...>> 47 >> 38 >> 57;  she has hypoglycemia awareness in the 60s. Highest sugar was 350 >> 400s >> 200.  Has ReliOn meter.  Pt's meals are: - coffee + cream + stevia right after she wakes up at 6:45 - Breakfast: oatmeal, yoghurt, sausage bisquit - - Lunch: salad, soup, sandwiches  - Dinner: meat + starch + vegetables - Snacks: yoghurt, peanut crackers, nuts  -No CKD: Lab Results  Component Value Date   BUN <5 (L) 11/10/2023   Lab Results  Component Value Date   CREATININE 0.67 11/10/2023   Lab Results  Component Value Date   MICRALBCREAT 152 (H) 11/20/2023   MICRALBCREAT 3.8 08/18/2022   MICRALBCREAT 40 (H) 02/25/2021   MICRALBCREAT 1.8 07/25/2020   MICRALBCREAT 1.4 06/14/2019   MICRALBCREAT 0.7 09/15/2017   MICRALBCREAT 1.3 05/07/2016    MICRALBCREAT 2.8 04/03/2015   MICRALBCREAT 1.0 07/20/2014   -She has a history of dyslipidemia: Lab Results  Component Value Date   CHOL 181 11/20/2023   HDL 95 11/20/2023   LDLCALC  11/20/2023     Comment:     . LDL cholesterol not calculated. Triglyceride levels greater than 400 mg/dL invalidate calculated LDL results. . Reference range: <100 . Desirable range <100 mg/dL for primary prevention;   <70 mg/dL for patients with CHD or diabetic patients  with > or = 2 CHD risk factors. Marland Kitchen LDL-C is now calculated using the Martin-Hopkins  calculation, which is a validated novel method providing  better accuracy than the Friedewald equation in the  estimation of LDL-C.  Horald Pollen et al. Lenox Ahr. 1610;960(45): 2061-2068  (http://education.[facility].com/faq/FAQ164)    LDLDIRECT 59.0 06/14/2019   TRIG 475 (H) 11/20/2023   CHOLHDL 1.9 11/20/2023   - last eye exam was in 01/06/2023: No DR  - no numbness and tingling in her feet.  Last foot exam 06/15/2023.   On 11/10/2023 she was in the emergency room with pain and shortness of breath.  She had an abnormal EKG and elevated troponin.  She has seen cardiology since then.  Ischemia was ruled out.    Latest TSH was normal: Lab Results  Component Value Date   TSH 0.82 11/20/2023   Her thyroid remains palpable.  Pt denies: - feeling nodules in neck - hoarseness - dysphagia - choking  ROS: + see HPI  I reviewed pt's medications, allergies, PMH, social hx, family hx, and changes were documented in the history of present illness. Otherwise, unchanged from my initial visit note.  Past Medical History:  Diagnosis Date   Alcoholic pancreatitis    Asthma    Has albuterol inhaler   Diabetes mellitus without complication (HCC)    Type 2 diagnosed 2013   Past Surgical History:  Procedure Laterality Date   COLONOSCOPY WITH PROPOFOL N/A 02/11/2018   Procedure: COLONOSCOPY WITH PROPOFOL;  Surgeon: Toney Reil, MD;   [facility] ENDOSCOPY;  Service: Gastroenterology;  Laterality: N/A;   NO PAST SURGERIES     History   Social History   Marital Status: Single    Spouse Name: N/A    Number of Children: 0   Years of Education: 14   Occupational History   [employer]ist     [employer]   Social History Main Topics   Smoking status: Current Every Day Smoker -- 0.50 packs/day for 25 years    Types: Cigarettes   Smokeless tobacco: Never Used   Alcohol Use: 1.8 oz/week    3 Cans of beer per week   Drug Use: No  Sexual Activity: Yes    Birth Control/ Protection: Condom   Social History Narrative   Shantrice was born in [place], [place]. She moved with her family to [place] at age 52. She attended 2 years at [employer] and was majoring in [place] Studies. Her goal is to become a Nurse, learning disability in the [employer]. 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 Brittini lives with her boyfriend, Vilinda Blanks. They are considering getting married soon. She enjoys sketching outdoor scenery, cartoons and loves to read.   Current Outpatient Medications on File Prior to Visit  Medication Sig Dispense Refill   albuterol (PROAIR HFA) 108 (90 Base) MCG/ACT inhaler Inhale 2 puffs into the lungs every 6 (six) hours as needed for wheezing or shortness of breath. 1 each 1   B-D ULTRAFINE III SHORT PEN 31G X 8 MM MISC USE FIVE TIMES DAILY 300 each 3   cetirizine (ZYRTEC) 10 MG tablet Take 10 mg by mouth daily as needed for allergies.      Continuous Glucose Sensor (DEXCOM G7 SENSOR) MISC 3 each by Does not apply route every 30 (thirty) days. Apply 1 sensor every 10 days 9 each 4   Glucagon 3 MG/DOSE POWD Place 3 mg into the nose once as needed for up to 1 dose. [id number] 1 each 11   hydrocortisone (ANUSOL-HC) 25 MG suppository Place 1 suppository (25 mg total) rectally 2 (two) times daily. 12 suppository 0   insulin aspart (NOVOLOG FLEXPEN) 100 UNIT/ML FlexPen INJECT 2-9 UNITS INTO THE SKIN 3  (THREE) TIMES DAILY WITH MEALS. 15 mL 1   insulin detemir (LEVEMIR FLEXPEN) 100 UNIT/ML FlexPen INJECT 12 UNITS INTO THE SKIN DAILY. 15 mL 1   Insulin Pen Needle (B-D UF III MINI PEN NEEDLES) 31G X 5 MM MISC USE 5x WHEN TAKING INSULIN. 300 each 3   Insulin Syringe-Needle U-100 (BD INSULIN SYRINGE U/F) 30G X 1/2" 0.5 ML MISC USE 2x a day WHEN TAKING INSULIN 200 each 3   triamcinolone ointment (KENALOG) 0.1 % APPLY 1 APPLICATION TOPICALLY 2 TIMES DAILY AS NEEDED. LIMIT USE DUE RISK OF SKIN DISCOLORATION. 453.6 g 0   No current facility-administered medications on file prior to visit.   No Known Allergies Family History  Problem Relation Age of Onset   Diabetes Father    Depression Paternal Grandmother    Depression Paternal Grandfather    Depression Cousin    Breast cancer Neg Hx    Arthritis/Rheumatoid Neg Hx    PE: BP 130/86   Pulse (!) 121   Ht 5\' 3"  (1.6 m)   Wt 104 lb (47.2 kg)   SpO2 99%   BMI 18.42 kg/m  Wt Readings from Last 3 Encounters:  01/19/24 104 lb (47.2 kg)  11/20/23 105 lb (47.6 kg)  11/10/23 108 lb 4 oz (49.1 kg)   Constitutional: thin, in NAD Eyes: EOMI, no exophthalmos ENT: + slight, symmetric thyromegaly, no cervical lymphadenopathy Cardiovascular: Tachycardia, RR, No MRG Respiratory: CTA B Musculoskeletal: no deformities Skin: no rashes Neurological: + mild tremor with outstretched hands  ASSESSMENT: 1. DM, insulin-dependent, uncontrolled, with complications She - History of DKA  Component     Latest Ref Rng & Units 11/04/2016  Hemoglobin A1C      9.0  C-Peptide     0.80 - 3.85 ng/mL <0.10 (L)  Glucose, Fasting     65 - 99 mg/dL 40 (L)  Glutamic Acid Decarb Ab     <5 IU/mL >250 (H)  Pancreatic Islet Cell Antibody     <5 JDF Units <5   Glucose very low at the time of the blood draw, therefore, C-peptide is not interpretable. However, GAD antibodies are undetectably high, confirming type 1 diabetes.  2.  Palpable thyroid  PLAN:  1.  Patient with history of uncontrolled type 1 diabetes, on basal/bolus insulin regimen, with increased insulin sensitivity, managed with low insulin doses.  She was on a CGM before but she had skin irritation with the Dexcom G6.  I gave her samples of G7 sensor before but she lost it.  She was not on the sensor at last visit, when she returned after having had a hypoglycemic seizure.  At that time sugars were in the 30s.  She was taken to the emergency room but had to leave AMA due to lack of insurance coverage. Reviewing the ED note from 10/2023, it was noted that she was drinking 12 beers a week.  We discussed about the fact that alcohol can lead to hypoglycemia.   -At last visit I gave her another sample of the Dexcom G7 and strongly advised her to wear it all the time.  I sent a prescription for this to her pharmacy.  The PA was approved. -At last visit, she was using lower doses of Levemir than recommended and based on the blood sugars, which were very high, up to 400s frequently, she was missing insulin doses.  She confirmed that she was afraid to bolus due to the recent seizure episode.  I advised her that she absolutely needed to do so and I commended to increase the Levemir dose.  I also advised her to try to cut the dose of NovoLog based on carbs, which she was not doing as she was referring to estimate the carbs.  We discussed about the timing of insulin action and recommended to inject 15 minutes before meals, rather than at the time of the meal or after, to avoid hypoglycemia.  I refilled her glucagon at that time. CGM interpretation: -At today's visit, we reviewed her CGM downloads: It appears that 85% of values are in target range (goal >70%), while 10% are higher than 180 (goal <25%), and 5% are lower than 70 (goal <4%).  The calculated average blood sugar is 131.  The projected HbA1c for the next 3 months (GMI) is approximately 6.4%. -Reviewing the CGM trends, sugars appear to be much better  control, fluctuating mainly within the target range but with drops under 70s overnight and with a compensatory increase in blood sugars around the time of breakfast.  Upon questioning, she is taking lower doses of Levemir than recommended at last visit and I advised her to decrease the dose of Levemir at bedtime and even more, from 3 to 2 units while increasing the dose in the morning from 3 to 4 units.  For now, we will continue the same doses of NovoLog. -I am hoping that we can continue with the sensor from now on. -  I suggested to:  Patient Instructions  Please use the following regimen: - Levemir 4 units in a.m. and 2 units at bedtime - Novolog - ICR 1:13 for starches (including crackers) for dinner but 1:15 otherwise  Bolus 10-15 minutes before each meal. Bolus 1 unit for coffee.  Please use the insulin to carb ratios to calculate the insulin boluses.  Please come back for a follow-up appointment in 2 months.  - we checked her HbA1c: 6.4% (slightly lower). - advised to  check sugars at different times of the day - 4x a day, rotating check times - advised to schedule her yearly eye exams >> she is not UTD - return to clinic in 2 months  2.  Palpable thyroid -The thyroid is symmetric and nonnodular on palpation -No neck compression symptoms -TSH was normal at last check Lab Results  Component Value Date   TSH 0.82 11/20/2023   Carlus Pavlov, MD PhD [facility] Endocrinology

## 2024-01-19 NOTE — Patient Instructions (Addendum)
Please use the following regimen: - Levemir 4 units in a.m. and 2 units at bedtime - Novolog - ICR 1:13 for starches (including crackers) for dinner but 1:15 otherwise  Bolus 10-15 minutes before each meal. Bolus 1 unit for coffee.  Please use the insulin to carb ratios to calculate the insulin boluses.  Please come back for a follow-up appointment in 2 months.

## 2024-01-19 NOTE — Telephone Encounter (Signed)
Pharmacy Patient Advocate Encounter  Received notification from [facility] that Prior Authorization for Dexcom G7 sensor has been APPROVED through 01/13/2025   PA #/Case ID/Reference #: [id number]

## 2024-01-19 NOTE — Telephone Encounter (Signed)
I checked on CMM for the status of the PA.   Outcome Approved on February 7 by [facility] NCPDP 2017 Your PA request has been approved. Additional information will be provided in the approval Dates:01/15/2024 - 01/14/2025

## 2024-02-24 ENCOUNTER — Ambulatory Visit: Payer: 59 | Admitting: Neurology

## 2024-03-17 ENCOUNTER — Encounter: Payer: Self-pay | Admitting: Family

## 2024-03-17 ENCOUNTER — Ambulatory Visit: Admitting: Family

## 2024-03-17 ENCOUNTER — Ambulatory Visit: Payer: Self-pay | Admitting: *Deleted

## 2024-03-17 VITALS — BP 136/80 | HR 110 | Temp 97.8°F | Ht 63.0 in | Wt 100.2 lb

## 2024-03-17 DIAGNOSIS — Z1231 Encounter for screening mammogram for malignant neoplasm of breast: Secondary | ICD-10-CM

## 2024-03-17 DIAGNOSIS — R000 Tachycardia, unspecified: Secondary | ICD-10-CM | POA: Insufficient documentation

## 2024-03-17 DIAGNOSIS — R634 Abnormal weight loss: Secondary | ICD-10-CM

## 2024-03-17 DIAGNOSIS — F32A Depression, unspecified: Secondary | ICD-10-CM

## 2024-03-17 DIAGNOSIS — R899 Unspecified abnormal finding in specimens from other organs, systems and tissues: Secondary | ICD-10-CM

## 2024-03-17 DIAGNOSIS — F419 Anxiety disorder, unspecified: Secondary | ICD-10-CM | POA: Diagnosis not present

## 2024-03-17 DIAGNOSIS — Z1211 Encounter for screening for malignant neoplasm of colon: Secondary | ICD-10-CM

## 2024-03-17 MED ORDER — PAROXETINE HCL 20 MG PO TABS: 20.0000 mg | ORAL_TABLET | Freq: Every morning | ORAL | 3 refills | Status: DC

## 2024-03-17 NOTE — Telephone Encounter (Signed)
 Patient was seen today by provider today at 1:30pm

## 2024-03-17 NOTE — Patient Instructions (Signed)
 We discussed depression and anxiety today.  We will start Paxil which is an excellent medicine to treat depression, anxiety and also off label use for hot flashes.    Please let me know how you are doing and certainly in a couple weeks you would like to increase this medication weeks can.  I have ordered an echocardiogram which is an ultrasound of the heart.  Let us know if you dont hear back within a week in regards to an appointment being scheduled.   So that you are aware, if you are [facility] MyChart user , please pay attention to your MyChart messages as you may receive a MyChart message with a phone number to call and schedule this test/appointment own your own from our referral coordinator. This is a new process so I do not want you to miss this message.  If you are not a MyChart user, you will receive a phone call.

## 2024-03-17 NOTE — Telephone Encounter (Signed)
  Chief Complaint: fatigue,weakness Symptoms: increased fatigue, lack of energy- patient reports decreased appetite, vomits twice weekly Frequency: 2 weeks Pertinent Negatives: Patient denies chest pain, fever, cough, SOB,  diarrhea, bleeding, other areas of pain Disposition: [] ED /[] Urgent Care (no appt availability in office) / [x] Appointment(In office/virtual)/ []  [place] [facility]/ [] Home Care/ [] Refused Recommended Disposition /[] [place] [facility]/ []  Follow-up with PCP Additional Notes: Appointment scheduled    Copied from CRM [id number]. Topic: Clinical - Red Word Triage >> Mar 17, 2024 11:12 AM Elle L wrote: Red Word that prompted transfer to Nurse Triage: The patient is diabetic and she has been having extreme weakness and fatigue. However, her blood sugar normals are level. Reason for Disposition . [1] MILD weakness (i.e., does not interfere with ability to work, go to school, normal activities) AND [2] persists > 1 week  Answer Assessment - Initial Assessment Questions 1. DESCRIPTION: "Describe how you are feeling."     Lack of energy 2. SEVERITY: "How bad is it?"  "Can you stand and walk?"   - MILD (0-3): Feels weak or tired, but does not interfere with work, school or normal activities.   - MODERATE (4-7): Able to stand and walk; weakness interferes with work, school, or normal activities.   - SEVERE (8-10): Unable to stand or walk; unable to do usual activities.     Mild- slower to move, having to use arms to help stand 3. ONSET: "When did these symptoms begin?" (e.g., hours, days, weeks, months)     2 weeks 4. CAUSE: "What do you think is causing the weakness or fatigue?" (e.g., not drinking enough fluids, medical problem, trouble sleeping)     Appetite  decreased, chronic sleeping disturbance  5. NEW MEDICINES:  "Have you started on any new medicines recently?" (e.g., opioid pain medicines, benzodiazepines, muscle relaxants, antidepressants, antihistamines,  neuroleptics, beta blockers)     no 6. OTHER SYMPTOMS: "Do you have any other symptoms?" (e.g., chest pain, fever, cough, SOB, vomiting, diarrhea, bleeding, other areas of pain)     Vomits twice weekly- phlegm  Protocols used: Weakness (Generalized) and Fatigue-A-AH

## 2024-03-17 NOTE — Progress Notes (Unsigned)
 Assessment & Plan:  Unintentional weight loss Assessment & Plan: Concern unintentional weight loss is aggravated by stress, depression and anxiety.  Discussed with patient importance of preventative maintenance.  She is due for mammogram, colonoscopy.  Pending labs, chest x-ray.  Orders: -     CBC with Differential/Platelet -     Comprehensive metabolic panel with GFR -     TSH -     Urinalysis, Routine w reflex microscopic -     DG Chest 2 View; Future  Screen for colon cancer -     Ambulatory referral to Gastroenterology  Encounter for screening mammogram for malignant neoplasm of breast -     3D Screening Mammogram, Left and Right; Future  Anxiety and depression Assessment & Plan: Uncontrolled.  Start Paxil 20 mg every day. Close follow-up  Orders: -     PARoxetine HCl; Take 1 tablet (20 mg total) by mouth every morning.  Dispense: 30 tablet; Refill: 3  Tachycardia Assessment & Plan: Fortunately asymptomatic.  Heart rate 110.  Discussed anxiety, cigarette use as contributory.  EKG with sinus tachycardia.  No significant changes when compared to previous EKG 11/10/2023.  Reviewed cardiology visit.  Concern for LVH.  Pending echocardiogram, labs.  Case and EKG reviewed with supervising, Dr Duncan Dull, and she and I jointly agreed on management plan.    Orders: -     ECHOCARDIOGRAM COMPLETE; Future -     EKG 12-Lead -     Magnesium     Return precautions given.   Risks, benefits, and alternatives of the medications and treatment plan prescribed today were discussed, and patient expressed understanding.   Education regarding symptom management and diagnosis given to patient on AVS either electronically or printed.  Return in about 6 weeks (around 04/28/2024).  Rennie Plowman, FNP  Subjective:    Patient ID: Mary Haney, female    DOB: [birth year], 53 y.o.   MRN: [id number]  CC: Mary Haney is a 53 y.o. female who presents today for an acute visit.     HPI: Complains of fatigue, weakness, worsening over the past couple of weeks.   Endorses decreased appetite, unintentional weight loss.   Sister has expressed concern for depression.   She is napping during the day.   Currently unemployed.  Endorses apathy, decreased motivation. She has financial concerns.   Yesterday she had a bowl of oatmeal, boiled egg, mango and then for dinner baked chicken sandwich  Drinking plenty of water  No fever, chills, CP, SOB, leg swelling, cough, polydipsia, polyphagia,    Denies sudafed.  She smoked a cigarette prior to coming here today. She drank one small coffee today.    Endorses episodic hot flashes.   She feels diabetes is well controlled. Denies hypoglycemic episodes.         dm type 1, follows with Dr Ester Rink A1c 6.4 01/19/24 She uses a Dexcom  Consult with cardiology 11/13/2023 for abnormal EKG, Dr Melton Alar. Echo ordered ( not obtained. HR 105   CXR 11/10/23 with cardiac silhouette within normal limits.  Lungs are clear. Due colonoscopy, repeat in 3 years  H/o seizure  ED 11/09/2024 for abnormal EKG.  Troponin 35  Allergies: Patient has no known allergies. Current Outpatient Medications on File Prior to Visit  Medication Sig Dispense Refill   albuterol (PROAIR HFA) 108 (90 Base) MCG/ACT inhaler Inhale 2 puffs into the lungs every 6 (six) hours as needed for wheezing or shortness of breath. 1 each 1   B-D  ULTRAFINE III SHORT PEN 31G X 8 MM MISC USE FIVE TIMES DAILY 300 each 3   cetirizine (ZYRTEC) 10 MG tablet Take 10 mg by mouth daily as needed for allergies.      Continuous Glucose Sensor (DEXCOM G7 SENSOR) MISC 3 each by Does not apply route every 30 (thirty) days. Apply 1 sensor every 10 days 9 each 4   Glucagon 3 MG/DOSE POWD Place 3 mg into the nose once as needed for up to 1 dose. [id number] 1 each 11   hydrocortisone (ANUSOL-HC) 25 MG suppository Place 1 suppository (25 mg total) rectally 2 (two) times daily.  12 suppository 0   insulin aspart (NOVOLOG FLEXPEN) 100 UNIT/ML FlexPen Inject 2-9 Units into the skin 3 (three) times daily with meals.     insulin detemir (LEVEMIR FLEXPEN) 100 UNIT/ML FlexPen Inject 12 Units into the skin daily.     Insulin Pen Needle (B-D UF III MINI PEN NEEDLES) 31G X 5 MM MISC USE 5x WHEN TAKING INSULIN. 300 each 3   Insulin Syringe-Needle U-100 (BD INSULIN SYRINGE U/F) 30G X 1/2" 0.5 ML MISC USE 2x a day WHEN TAKING INSULIN 200 each 3   triamcinolone ointment (KENALOG) 0.1 % APPLY 1 APPLICATION TOPICALLY 2 TIMES DAILY AS NEEDED. LIMIT USE DUE RISK OF SKIN DISCOLORATION. 453.6 g 0   No current facility-administered medications on file prior to visit.    Review of Systems  Constitutional:  Positive for diaphoresis ('hot flashes), fatigue and unexpected weight change. Negative for chills and fever.  HENT:  Negative for congestion.   Respiratory:  Negative for cough and shortness of breath.   Cardiovascular:  Negative for chest pain and palpitations.  Gastrointestinal:  Negative for nausea and vomiting.  Psychiatric/Behavioral:  Positive for sleep disturbance. Negative for suicidal ideas. The patient is nervous/anxious.       Objective:    BP 136/80   Pulse (!) 110   Temp 97.8 F (36.6 C) (Oral)   Ht 5\' 3"  (1.6 m)   Wt 100 lb 3.2 oz (45.5 kg)   SpO2 99%   BMI 17.75 kg/m   BP Readings from Last 3 Encounters:  03/17/24 136/80  01/19/24 130/86  11/20/23 (!) 138/90   Wt Readings from Last 3 Encounters:  03/17/24 100 lb 3.2 oz (45.5 kg)  01/19/24 104 lb (47.2 kg)  11/20/23 105 lb (47.6 kg)    Physical Exam Vitals reviewed.  Constitutional:      Appearance: She is well-developed.  Eyes:     Conjunctiva/sclera: Conjunctivae normal.  Cardiovascular:     Rate and Rhythm: Regular rhythm. Tachycardia present.     Pulses: Normal pulses.     Heart sounds: Normal heart sounds.  Pulmonary:     Effort: Pulmonary effort is normal.     Breath sounds: Normal  breath sounds. No wheezing, rhonchi or rales.  Skin:    General: Skin is warm and dry.  Neurological:     Mental Status: She is alert.  Psychiatric:        Speech: Speech normal.        Behavior: Behavior normal.        Thought Content: Thought content normal.

## 2024-03-17 NOTE — Assessment & Plan Note (Signed)
 Fortunately asymptomatic.  Heart rate 110.  Discussed anxiety, cigarette use as contributory.  EKG with sinus tachycardia.  No significant changes when compared to previous EKG 11/10/2023.  Reviewed cardiology visit.  Concern for LVH.  Pending echocardiogram, labs.  Case and EKG reviewed with supervising, Dr Duncan Dull, and she and I jointly agreed on management plan.

## 2024-03-18 ENCOUNTER — Encounter: Payer: Self-pay | Admitting: Family

## 2024-03-18 LAB — COMPREHENSIVE METABOLIC PANEL WITH GFR
ALT: 94 U/L — ABNORMAL HIGH (ref 0–35)
AST: 278 U/L — ABNORMAL HIGH (ref 0–37)
Albumin: 4.9 g/dL (ref 3.5–5.2)
Alkaline Phosphatase: 150 U/L — ABNORMAL HIGH (ref 39–117)
BUN: 3 mg/dL — ABNORMAL LOW (ref 6–23)
CO2: 29 meq/L (ref 19–32)
Calcium: 9.8 mg/dL (ref 8.4–10.5)
Chloride: 92 meq/L — ABNORMAL LOW (ref 96–112)
Creatinine, Ser: 0.64 mg/dL (ref 0.40–1.20)
GFR: 102.32 mL/min (ref >60.00)
Glucose, Bld: 137 mg/dL — ABNORMAL HIGH (ref 70–99)
Potassium: 4.4 meq/L (ref 3.5–5.1)
Sodium: 133 meq/L — ABNORMAL LOW (ref 135–145)
Total Bilirubin: 1.3 mg/dL — ABNORMAL HIGH (ref 0.2–1.2)
Total Protein: 8.3 g/dL (ref 6.0–8.3)

## 2024-03-18 LAB — URINALYSIS, ROUTINE W REFLEX MICROSCOPIC
Ketones, ur: 15 — AB
Leukocytes,Ua: NEGATIVE
Nitrite: NEGATIVE
Specific Gravity, Urine: 1.015 (ref 1.000–1.030)
Total Protein, Urine: 30 — AB
Urine Glucose: >=1000 — AB
Urobilinogen, UA: 1 (ref 0.0–1.0)
pH: 6 (ref 5.0–8.0)

## 2024-03-18 LAB — CBC WITH DIFFERENTIAL/PLATELET
Basophils Absolute: 0.1 10*3/uL (ref 0.0–0.1)
Basophils Relative: 1 % (ref 0.0–3.0)
Eosinophils Absolute: 0 10*3/uL (ref 0.0–0.7)
Eosinophils Relative: 0.1 % (ref 0.0–5.0)
HCT: 38.8 % (ref 36.0–46.0)
Hemoglobin: 13 g/dL (ref 12.0–15.0)
Lymphocytes Relative: 29.8 % (ref 12.0–46.0)
Lymphs Abs: 1.8 10*3/uL (ref 0.7–4.0)
MCHC: 33.5 g/dL (ref 30.0–36.0)
MCV: 102.3 fl — ABNORMAL HIGH (ref 78.0–100.0)
Monocytes Absolute: 0.8 10*3/uL (ref 0.1–1.0)
Monocytes Relative: 12.5 % — ABNORMAL HIGH (ref 3.0–12.0)
Neutro Abs: 3.5 10*3/uL (ref 1.4–7.7)
Neutrophils Relative %: 56.6 % (ref 43.0–77.0)
Platelets: 261 10*3/uL (ref 150.0–400.0)
RBC: 3.8 Mil/uL — ABNORMAL LOW (ref 3.87–5.11)
RDW: 13.7 % (ref 11.5–15.5)
WBC: 6.2 10*3/uL (ref 4.0–10.5)

## 2024-03-18 LAB — TSH: TSH: 1.41 u[IU]/mL (ref 0.35–5.50)

## 2024-03-18 LAB — MAGNESIUM: Magnesium: 1.9 mg/dL (ref 1.5–2.5)

## 2024-03-18 NOTE — Assessment & Plan Note (Signed)
 Uncontrolled.  Start Paxil 20 mg every day. Close follow-up

## 2024-03-18 NOTE — Assessment & Plan Note (Signed)
 Concern unintentional weight loss is aggravated by stress, depression and anxiety.  Discussed with patient importance of preventative maintenance.  She is due for mammogram, colonoscopy.  Pending labs, chest x-ray.

## 2024-03-21 ENCOUNTER — Telehealth: Payer: Self-pay

## 2024-03-21 NOTE — Addendum Note (Signed)
 Addended by: Nyna Chilton on: 03/21/2024 03:54 PM   Modules accepted: Orders

## 2024-03-21 NOTE — Telephone Encounter (Signed)
 LVM to call back to discuss lab results and schedule f/up lab appt  Call patient I am quite concerned with lab work. How is she feeling?   Has she been drinking alcohol or using Tylenol?  Liver enzymes are significantly elevated.  Please order CMP, UA, urine culture, CBC and schedule in 1 week time. Please triage for nausea, vomiting or abdominal pain.  She would need a prompt  re- evaluation.

## 2024-03-22 ENCOUNTER — Encounter: Payer: Self-pay | Admitting: Internal Medicine

## 2024-03-22 ENCOUNTER — Ambulatory Visit: Payer: 59 | Admitting: Internal Medicine

## 2024-03-22 VITALS — BP 130/80 | HR 109 | Ht 63.0 in | Wt 102.4 lb

## 2024-03-22 DIAGNOSIS — E049 Nontoxic goiter, unspecified: Secondary | ICD-10-CM

## 2024-03-22 DIAGNOSIS — E1065 Type 1 diabetes mellitus with hyperglycemia: Secondary | ICD-10-CM

## 2024-03-22 LAB — POCT GLYCOSYLATED HEMOGLOBIN (HGB A1C): Hemoglobin A1C: 6.1 % — AB (ref 4.0–5.6)

## 2024-03-22 NOTE — Patient Instructions (Addendum)
 Please use the following regimen: - Levemir 4 units in a.m. and 2 units at bedtime - Novolog 10-15 minutes before each meal: 2 units before a smaller meal 3 units before a regular meal 4 units before a large meal Bolus 1 unit for coffee.  Please come back for a follow-up appointment in 3 months.

## 2024-03-22 NOTE — Progress Notes (Signed)
 Patient ID: Mary Haney, female   DOB: May 14, 1971, 53 y.o.   MRN: [id number]  HPI: Mary Haney is a 53 y.o.-year-old female, returning for f/u for DM, dx 2013, insulin-dependent, uncontrolled, without long term complications. Last visit was 2 months ago.   Interim history: No increased urination, blurry vision, nausea. Patient had a lot of stress after being fired in 08/2023 after a change in management at work.  She now has [employer].  She is still out of work.  She feels that she is depressed.  She is not seeing a Veterinary surgeon.  She does not have an appetite.  She continues to lose weight.  She is seeing her primary care doctor for this.  Reviewed history: In 07/2012 she went to the ED because she was not feeling well. She was found to have a blood glucose level of 600 + and was admitted with DKA. She was followed by Dr. Lorelei Haney immediately following initial diagnosis, then switched to [facility] endocr[facility]ology.   In 10/2013, due to lack of finances, she was stretching the insulin doses >> sugars high >> low CBG 25-26 >> ED.    She was admitted in 04/2016 for DKA.  At the end of 2017, she met the DM educator and nutritionist >> decided for Omnipod pump >> but nobody called her back and then she could not afford it anymore.  She decided against that insulin pump, afterwards.  Reviewed HbA1c levels: Lab Results  Component Value Date   HGBA1C 6.4 (A) 01/19/2024   HGBA1C 6.7 (A) 11/20/2023   HGBA1C 6.5 06/15/2023   HGBA1C 8.9 (A) 01/09/2023   HGBA1C 7.4 (A) 08/18/2022   HGBA1C 6.3 (A) 04/21/2022   HGBA1C 6.3 (A) 12/16/2021   HGBA1C 6.1 (A) 07/29/2021   HGBA1C 6.5 (A) 03/18/2021   HGBA1C 7.1 (A) 12/17/2020   HGBA1C 7.9 (H) 07/25/2020   HGBA1C 8.8 (A) 04/02/2020   HGBA1C 8.7 (A) 12/27/2019   HGBA1C 6.9 (A) 09/20/2019   HGBA1C 8.5 (A) 06/14/2019   HGBA1C 9.1 (A) 02/15/2019   HGBA1C 7.4 (A) 06/22/2018   HGBA1C 7.7 03/18/2018   HGBA1C 7.9 12/17/2017   HGBA1C 7.9 09/15/2017    Pt is on a regimen of: - Levemir 3 units 2x a day >> 4 units in a.m. and 2 units at night - Novolog - ICR 1:13 for starches (including crackers) and with dinner but use 1:15 otherwise (2-3 units - estimating) Bolus 10-15 minutes before each meal. Bolus 1 unit for coffee.  She checks her sugars more than 4 times a day per review of her CGM:  Previously:   Previously:   Initially:    Lowest sugar was  38 >> 57 >> upper 50s; she has hypoglycemia awareness in the 60s. She had an episode of hypoglycemia (? 20s, 38, 39 -11/05/2023) during which she was noted to have unusual behavior and then have seizure activity while eating with her family.  She was brought to the emergency room and blood sugar was corrected.   Highest sugar was 350 >> 400s >> 200 >> 399.  Has ReliOn meter.  Pt's meals are: - coffee + cream + stevia right after she wakes up at 6:45 - Breakfast: oatmeal, yoghurt, sausage bisquit - - Lunch: salad, soup, sandwiches  - Dinner: meat + starch + vegetables - Snacks: yoghurt, peanut crackers, nuts  -No CKD: Lab Results  Component Value Date   BUN 3 (L) 03/17/2024   Lab Results  Component Value Date   CREATININE 0.64 03/17/2024  Lab Results  Component Value Date   MICRALBCREAT 152 (H) 11/20/2023   MICRALBCREAT 3.8 08/18/2022   MICRALBCREAT 40 (H) 02/25/2021   MICRALBCREAT 1.8 07/25/2020   MICRALBCREAT 1.4 06/14/2019   MICRALBCREAT 0.7 09/15/2017   MICRALBCREAT 1.3 05/07/2016   MICRALBCREAT 2.8 04/03/2015   MICRALBCREAT 1.0 07/20/2014   -She has a history of dyslipidemia: Lab Results  Component Value Date   CHOL 181 11/20/2023   HDL 95 11/20/2023   LDLCALC  11/20/2023     Comment:     . LDL cholesterol not calculated. Triglyceride levels greater than 400 mg/dL invalidate calculated LDL results. . Reference range: <100 . Desirable range <100 mg/dL for primary prevention;   <70 mg/dL for patients with CHD or diabetic patients  with > or = 2 CHD  risk factors. Mary Haney LDL-C is now calculated using the Martin-Hopkins  calculation, which is a validated novel method providing  better accuracy than the Friedewald equation in the  estimation of LDL-C.  Mary Haney et al. Mary Haney. [id number];119(14): 2061-2068  (http://education.[facility].com/faq/FAQ164)    LDLDIRECT 59.0 06/14/2019   TRIG 475 (H) 11/20/2023   CHOLHDL 1.9 11/20/2023   - last eye exam was in 01/06/2023: No DR  - no numbness and tingling in her feet.  Last foot exam 06/15/2023.   On 11/10/2023 she was in the emergency room with pain and shortness of breath.  She had an abnormal EKG and elevated troponin.  She has seen cardiology since then.  Ischemia was ruled out.    Latest TSH was normal: Lab Results  Component Value Date   TSH 1.41 03/17/2024   Her thyroid remains palpable.  Pt denies: - feeling nodules in neck - hoarseness - dysphagia - choking  ROS: + see HPI  I reviewed pt's medications, allergies, PMH, social hx, family hx, and changes were documented in the history of present illness. Otherwise, unchanged from my initial visit note.  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 She moved with her family to [place]  at age 17. She attended 2 years at [employer] and was majoring in [place] Studies. Her goal is to become a Nurse, learning disability in the [employer]. 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 Shirley lives with her boyfriend, Jorene New. They are considering getting married soon. She enjoys sketching outdoor scenery, cartoons and loves to  read.   Current Outpatient Medications on File Prior to Visit  Medication Sig Dispense Refill   albuterol (PROAIR HFA) 108 (90 Base) MCG/ACT inhaler Inhale 2 puffs into the lungs every 6 (six) hours as needed for wheezing or shortness of breath. 1 each 1   B-D ULTRAFINE III SHORT PEN 31G X 8 MM MISC USE FIVE TIMES Haney 300 each 3   cetirizine (ZYRTEC) 10 MG tablet Take 10 mg by mouth Haney as needed for allergies.      Continuous Glucose Sensor (DEXCOM G7 SENSOR) MISC 3 each by Does not apply route every 30 (thirty) days. Apply 1 sensor every 10 days 9 each 4   Glucagon 3 MG/DOSE POWD Place 3 mg into the nose once as needed for up to 1 dose. [id number] 1 each 11   hydrocortisone (ANUSOL-HC) 25 MG suppository Place 1 suppository (25 mg total) rectally 2 (two) times Haney. 12 suppository 0   insulin aspart (NOVOLOG FLEXPEN) 100 UNIT/ML FlexPen Inject 2-9 Units into the skin 3 (three) times Haney with meals.     insulin detemir (LEVEMIR FLEXPEN) 100 UNIT/ML FlexPen Inject 12 Units into the skin Haney.     Insulin Pen Needle (B-D UF III MINI PEN NEEDLES) 31G X 5 MM MISC USE 5x WHEN TAKING INSULIN. 300 each 3   Insulin Syringe-Needle U-100 (BD INSULIN SYRINGE U/F) 30G X 1/2" 0.5 ML MISC USE 2x a day WHEN TAKING INSULIN 200 each 3   PARoxetine (PAXIL) 20 MG tablet Take 1 tablet (20 mg total) by mouth every morning. 30 tablet 3   triamcinolone ointment (KENALOG) 0.1  % APPLY 1 APPLICATION TOPICALLY 2 TIMES Haney AS NEEDED. LIMIT USE DUE RISK OF SKIN DISCOLORATION. 453.6 g 0   No current facility-administered medications on file prior to visit.   No Known Allergies Family History  Problem Relation Age of Onset   Diabetes Father    Depression Paternal Grandmother    Depression Paternal Grandfather    Depression Cousin    Breast cancer Neg Hx    Arthritis/Rheumatoid Neg Hx    PE: BP 130/80   Pulse (!) 109   Ht 5\' 3"  (1.6 m)   Wt 102 lb 6.4 oz (46.4 kg)   SpO2 96%   BMI 18.14 kg/m  Wt Readings from Last 20 Encounters:  03/22/24 102 lb 6.4 oz (46.4 kg)  03/17/24 100 lb 3.2 oz (45.5 kg)  01/19/24 104 lb (47.2 kg)  11/20/23 105 lb (47.6 kg)  11/10/23 108 lb 4 oz (49.1 kg)  11/10/23 108 lb 4 oz (49.1 kg)  07/14/23 109 lb (49.4 kg)  06/15/23 110 lb 6.4 oz (50.1 kg)  01/09/23 110 lb (49.9 kg)  10/08/22 111 lb 3.2 oz (50.4 kg)  08/18/22 110 lb 9.6 oz (50.2 kg)  06/04/22 118 lb 9.6 oz (53.8 kg)  04/21/22 118 lb 6.4 oz (53.7 kg)  12/16/21 120 lb 6.4 oz (54.6 kg)  07/29/21 123 lb 9.6 oz (56.1 kg)  03/18/21 124 lb 12.8 oz (56.6 kg)  02/08/21 130 lb 3.2 oz (59.1 kg)  12/17/20 130 lb 3.2 oz (59.1 kg)  08/06/20 133 lb (60.3 kg)  07/10/20 134 lb (60.8 kg)   Constitutional: thin, in NAD Eyes: EOMI, no exophthalmos ENT: + slight, symmetric thyromegaly, no cervical lymphadenopathy Cardiovascular: tachycardia, RR, No MRG Respiratory: CTA B Musculoskeletal: no deformities Skin: no rashes Neurological: + tremor with outstretched hands Diabetic Foot Exam - Simple   Simple Foot Form Diabetic Foot exam was performed with  the following findings: Yes 03/22/2024 11:07 AM  Visual Inspection No deformities, no ulcerations, no other skin breakdown bilaterally: Yes Sensation Testing Intact to touch and monofilament testing bilaterally: Yes Pulse Check Posterior Tibialis and Dorsalis pulse intact bilaterally: Yes Comments    ASSESSMENT: 1. DM,  insulin-dependent, uncontrolled, with complications She - History of DKA  Component     Latest Ref Rng & Units 11/04/2016  Hemoglobin A1C      9.0  C-Peptide     0.80 - 3.85 ng/mL <0.10 (L)  Glucose, Fasting     65 - 99 mg/dL 40 (L)  Glutamic Acid Decarb Ab     <5 IU/mL >250 (H)  Pancreatic Islet Cell Antibody     <5 JDF Units <5   Glucose very low at the time of the blood draw, therefore, C-peptide is not interpretable. However, GAD antibodies are undetectably high, confirming type 1 diabetes.  2.  Palpable thyroid  PLAN:  1. Patient with history of uncontrolled type 1 diabetes, on basal insulin regimen, with high insulin sensitivity, managed with low insulin doses.  She had site irritation from the Dexcom G6 but tolerating the G7 well.  A PA for this was approved.  Last year she had a hypoglycemic seizure.  At that time, sugars were in the 30s.She was taken to the emergency room but had to leave AMA due to lack of insurance coverage. Reviewing the ED note from 10/2023, it was noted that she was drinking 12 beers a week.  We discussed about the fact that alcohol can lead to hypoglycemia.  Afterwards, she was afraid of hypoglycemia and sugars were mostly elevated but before last visit, they started to improve.  At the time of the last visit, sugars were much better controlled, fluctuating mainly within the target range but with occasional drops under 70s overnight and with a compensatory increase in blood sugars around breakfast.  Upon questioning, she was taking lower doses of Levemir than recommended so we advised her to increase the dose slightly in the morning and decrease the dose at night in a compensatory fashion.  We did not change her NovoLog doses.  I did advise her to use insulin to carb ratios to calculate insulin boluses. CGM interpretation: -At today's visit, we reviewed her CGM downloads: It appears that 65% of values are in target range (goal >70%), while 32% are higher than  180 (goal <25%), and 3% are lower than 70 (goal <4%).  The calculated average blood sugar is 159.  The projected HbA1c for the next 3 months (GMI) is 7.1%. -Reviewing the CGM trends, sugars appear to be worse than at last visit she has nadirs around 3 AM and 5 PM.  It appears that the root of the problem could be the fact that she is not taking enough insulin before dinner, only taking 2 units of Novolog usually.  Sugars increased after this meal as a consequence and she corrects down with 1 unit of NovoLog around 12 AM.  After this, sugars improved but sometimes they drop too low followed by gradual increase in blood sugars in the first half of the day, peaking around 12 PM.  They improved afterwards occasionally to the point of lows and then increase again after dinner as before.  I advised her to take more insulin before dinner, and probably before the rest of the meals, 3 units and may need even 4 for large meals.  I also advised her to try not to correct postprandially  since she is so insulin sensitive.  For now, we will continue the same dose of Levemir. -  I suggested to:  Patient Instructions  Please use the following regimen: - Levemir 4 units in a.m. and 2 units at bedtime - Novolog 10-15 minutes before each meal: 2 units before a smaller meal 3 units before a regular meal 4 units before a large meal Bolus 1 unit for coffee.  Please come back for a follow-up appointment in 3 months.  - we checked her HbA1c: 6.1% (lower) - advised to check sugars at different times of the day - 4x a day, rotating check times - advised for yearly eye exams >> she is UTD - return to clinic in 3 months  2.  Palpable thyroid - The thyroid is symmetric and nonnodular on palpation - No neck compression symptoms - TSH was normal at last check 5 days ago: Lab Results  Component Value Date   TSH 1.41 03/17/2024   Emilie Harden, MD PhD [facility]

## 2024-03-23 ENCOUNTER — Encounter: Payer: Self-pay | Admitting: Internal Medicine

## 2024-03-23 LAB — MICROALBUMIN / CREATININE URINE RATIO
Creatinine, Urine: 155 mg/dL (ref 20–275)
Microalb Creat Ratio: 119 mg/g{creat} — ABNORMAL HIGH (ref <30)
Microalb, Ur: 18.4 mg/dL

## 2024-03-24 ENCOUNTER — Other Ambulatory Visit (INDEPENDENT_AMBULATORY_CARE_PROVIDER_SITE_OTHER)

## 2024-03-24 DIAGNOSIS — R899 Unspecified abnormal finding in specimens from other organs, systems and tissues: Secondary | ICD-10-CM | POA: Diagnosis not present

## 2024-03-24 LAB — CBC WITH DIFFERENTIAL/PLATELET
Basophils Absolute: 0 10*3/uL (ref 0.0–0.1)
Basophils Relative: 1.4 % (ref 0.0–3.0)
Eosinophils Absolute: 0 10*3/uL (ref 0.0–0.7)
Eosinophils Relative: 0.5 % (ref 0.0–5.0)
HCT: 37.6 % (ref 36.0–46.0)
Hemoglobin: 12.5 g/dL (ref 12.0–15.0)
Lymphocytes Relative: 32.8 % (ref 12.0–46.0)
Lymphs Abs: 1.1 10*3/uL (ref 0.7–4.0)
MCHC: 33.2 g/dL (ref 30.0–36.0)
MCV: 102.1 fl — ABNORMAL HIGH (ref 78.0–100.0)
Monocytes Absolute: 0.4 10*3/uL (ref 0.1–1.0)
Monocytes Relative: 12.8 % — ABNORMAL HIGH (ref 3.0–12.0)
Neutro Abs: 1.7 10*3/uL (ref 1.4–7.7)
Neutrophils Relative %: 52.5 % (ref 43.0–77.0)
Platelets: 304 10*3/uL (ref 150.0–400.0)
RBC: 3.68 Mil/uL — ABNORMAL LOW (ref 3.87–5.11)
RDW: 13.9 % (ref 11.5–15.5)
WBC: 3.3 10*3/uL — ABNORMAL LOW (ref 4.0–10.5)

## 2024-03-24 LAB — URINALYSIS, ROUTINE W REFLEX MICROSCOPIC
Bilirubin Urine: NEGATIVE
Hgb urine dipstick: NEGATIVE
Ketones, ur: 15 — AB
Leukocytes,Ua: NEGATIVE
Nitrite: NEGATIVE
RBC / HPF: NONE SEEN (ref 0–?)
Specific Gravity, Urine: 1.025 (ref 1.000–1.030)
Urine Glucose: NEGATIVE
Urobilinogen, UA: 0.2 (ref 0.0–1.0)
WBC, UA: NONE SEEN (ref 0–?)
pH: 6 (ref 5.0–8.0)

## 2024-03-24 LAB — COMPREHENSIVE METABOLIC PANEL WITH GFR
ALT: 107 U/L — ABNORMAL HIGH (ref 0–35)
AST: 398 U/L — ABNORMAL HIGH (ref 0–37)
Albumin: 4.4 g/dL (ref 3.5–5.2)
Alkaline Phosphatase: 153 U/L — ABNORMAL HIGH (ref 39–117)
BUN: 3 mg/dL — ABNORMAL LOW (ref 6–23)
CO2: 29 meq/L (ref 19–32)
Calcium: 9.1 mg/dL (ref 8.4–10.5)
Chloride: 94 meq/L — ABNORMAL LOW (ref 96–112)
Creatinine, Ser: 0.58 mg/dL (ref 0.40–1.20)
GFR: 104.76 mL/min (ref >60.00)
Glucose, Bld: 218 mg/dL — ABNORMAL HIGH (ref 70–99)
Potassium: 4.2 meq/L (ref 3.5–5.1)
Sodium: 134 meq/L — ABNORMAL LOW (ref 135–145)
Total Bilirubin: 0.9 mg/dL (ref 0.2–1.2)
Total Protein: 7.4 g/dL (ref 6.0–8.3)

## 2024-03-25 LAB — URINE CULTURE
MICRO NUMBER:: 16343025
SPECIMEN QUALITY:: ADEQUATE

## 2024-03-28 ENCOUNTER — Encounter: Payer: Self-pay | Admitting: Family

## 2024-03-28 ENCOUNTER — Telehealth: Payer: Self-pay

## 2024-03-28 ENCOUNTER — Other Ambulatory Visit: Payer: Self-pay | Admitting: Family

## 2024-03-28 DIAGNOSIS — R748 Abnormal levels of other serum enzymes: Secondary | ICD-10-CM

## 2024-03-28 NOTE — Telephone Encounter (Signed)
 Call pt I collaborated with GI, Dr. Antony Baumgartner.  He recommended ultrasound of her liver.  I ordered this as well.  I ordered this stat.  Please asked patient to call the office if she is not scheduled this week  Please see prior telephone note in regards to labs.  Patient needs labs to be scheduled this week.    Please inquire and quantify about alcohol use

## 2024-03-28 NOTE — Telephone Encounter (Signed)
 LVM to call back to discuss lab results also to ask information below and schedule    Call pt If she still drinking alcohol or taking Tylenol ? Please ask her to stop Sch labs in the next 1-2 days Review result note with her

## 2024-03-28 NOTE — Telephone Encounter (Signed)
 LVM to call back to office to discuss note below   Call pt I collaborated with GI, Dr. Antony Baumgartner.  He recommended ultrasound of her liver.  I ordered this as well.  I ordered this stat.  Please asked patient to call the office if she is not scheduled this week   Please see prior telephone note in regards to labs.  Patient needs labs to be scheduled this week.     Please inquire and quantify about alcohol use

## 2024-03-30 ENCOUNTER — Encounter: Payer: Self-pay | Admitting: Family

## 2024-03-30 ENCOUNTER — Ambulatory Visit
Admission: RE | Admit: 2024-03-30 | Discharge: 2024-03-30 | Disposition: A | Discharge status: Home / Self Care | Source: Ambulatory Visit | Attending: Family | Admitting: Family

## 2024-03-30 DIAGNOSIS — R748 Abnormal levels of other serum enzymes: Secondary | ICD-10-CM | POA: Insufficient documentation

## 2024-03-30 DIAGNOSIS — K700 Alcoholic fatty liver: Secondary | ICD-10-CM | POA: Insufficient documentation

## 2024-03-30 DIAGNOSIS — K760 Fatty (change of) liver, not elsewhere classified: Secondary | ICD-10-CM | POA: Insufficient documentation

## 2024-03-30 NOTE — Telephone Encounter (Signed)
 LVM  3rd time to call back to office to discuss note below    Call pt I collaborated with GI, Dr. Antony Baumgartner.  He recommended ultrasound of her liver.  I ordered this as well.  I ordered this stat.  Please asked patient to call the office if she is not scheduled this week   Please see prior telephone note in regards to labs.  Patient needs labs to be scheduled this week.     Please inquire and quantify about alcohol use

## 2024-03-31 NOTE — Telephone Encounter (Signed)
 LVM   4th to call back to office to discuss note below    Call pt I collaborated with GI, Dr. Antony Baumgartner.  He recommended ultrasound of her liver.  I ordered this as well.  I ordered this stat.  Please asked patient to call the office if she is not scheduled this week   Please see prior telephone note in regards to labs.  Patient needs labs to be scheduled this week.     Please inquire and quantify about alcohol use

## 2024-03-31 NOTE — Telephone Encounter (Signed)
 Letter mailed to pt on 03/31/24

## 2024-04-01 ENCOUNTER — Telehealth: Payer: Self-pay | Admitting: Family

## 2024-04-01 ENCOUNTER — Encounter: Payer: Self-pay | Admitting: Family

## 2024-04-01 NOTE — Telephone Encounter (Signed)
 Spoke to pt  She is feeling well.  She endorses drinking alcohol 2-3 times per week  Drinks 6 pack per occasion.   She has yet to start paxil   Denies tremors, diaphoresis   She will call to schedule to  lab.  Discussed treatment for AA and advised that I would provide resources via [employer]

## 2024-04-01 NOTE — Telephone Encounter (Signed)
 Noted.

## 2024-04-04 ENCOUNTER — Encounter: Payer: Self-pay | Admitting: *Deleted

## 2024-04-05 ENCOUNTER — Encounter: Payer: Self-pay | Admitting: Family

## 2024-04-10 ENCOUNTER — Telehealth: Payer: Self-pay | Admitting: Family

## 2024-04-10 NOTE — Telephone Encounter (Signed)
 Reviewed chart Following with Dr Edson Graces, endocrine Urine micro obtained 03/22/24; elevated and plans to repeat

## 2024-04-28 ENCOUNTER — Ambulatory Visit: Admitting: Family

## 2024-05-07 ENCOUNTER — Other Ambulatory Visit: Payer: Self-pay | Admitting: Internal Medicine

## 2024-05-13 ENCOUNTER — Telehealth: Payer: Self-pay | Admitting: Family

## 2024-05-13 NOTE — Telephone Encounter (Signed)
LVM to call back to office  

## 2024-05-13 NOTE — Telephone Encounter (Signed)
 Copied from CRM [id number]. Topic: General - Call Back - No Documentation >> May 12, 2024  4:56 PM DeAngela L wrote: Reason for CRM: Patient would like a call back from her provider  Pt number [phone] (M)

## 2024-05-16 NOTE — Telephone Encounter (Signed)
 LVM to call back to office to address pt phone call

## 2024-05-24 NOTE — Telephone Encounter (Signed)
 LVM to call back to office to address pt phone call

## 2024-05-24 NOTE — Telephone Encounter (Signed)
 Letter mailed to pt on 05/24/24

## 2024-06-16 ENCOUNTER — Telehealth: Payer: Self-pay | Admitting: Internal Medicine

## 2024-06-16 ENCOUNTER — Telehealth: Payer: Self-pay

## 2024-06-16 NOTE — Telephone Encounter (Signed)
 LVM to call back to office to see why pt called pt was mailed letter back in June to contact us  because we could not reach her by phone! Documentation is in the chart! 05/24/24

## 2024-06-16 NOTE — Telephone Encounter (Signed)
 Patient called to cancel appointment for Monday 06/20/2024 due to losing health insurance.  Patient states that she would like to speak with Dr. Ara clinical staff, but would not disclose what it was about.

## 2024-06-16 NOTE — Telephone Encounter (Signed)
 Copied from CRM [id number]. Topic: General - Call Back - No Documentation >> Jun 16, 2024  1:10 PM Franky GRADE wrote: Reason for CRM: Patient is returning a call she received from the office. Called CAL and there was no documentation.

## 2024-06-17 NOTE — Telephone Encounter (Signed)
 LMTRC,  Dicie Beam

## 2024-06-20 ENCOUNTER — Ambulatory Visit: Admitting: Internal Medicine

## 2024-06-20 NOTE — Telephone Encounter (Signed)
 LMTRC,  Dicie Beam

## 2024-09-28 ENCOUNTER — Telehealth: Payer: Self-pay

## 2024-09-28 NOTE — Telephone Encounter (Signed)
 Spoke to pt this was correspondence from June 2025

## 2024-09-28 NOTE — Telephone Encounter (Signed)
 Copied from CRM [id number]. Topic: General - Inquiry >> Sep 28, 2024 11:42 AM Laymon HERO wrote: Reason for CRM: Patient received a letter in the mail from Rollene Dineen cain the patient to contact her.

## 2024-10-14 ENCOUNTER — Other Ambulatory Visit: Payer: Self-pay | Admitting: Family

## 2024-10-14 DIAGNOSIS — L309 Dermatitis, unspecified: Secondary | ICD-10-CM

## 2024-10-18 ENCOUNTER — Encounter: Payer: Self-pay | Admitting: Nurse Practitioner

## 2024-10-18 ENCOUNTER — Ambulatory Visit: Admitting: Nurse Practitioner

## 2024-10-18 ENCOUNTER — Ambulatory Visit
Admission: RE | Admit: 2024-10-18 | Discharge: 2024-10-18 | Disposition: A | Discharge status: Home / Self Care | Source: Ambulatory Visit | Attending: Nurse Practitioner | Admitting: Nurse Practitioner

## 2024-10-18 ENCOUNTER — Ambulatory Visit: Payer: Self-pay | Admitting: Nurse Practitioner

## 2024-10-18 VITALS — BP 152/90 | HR 92 | Temp 98.0°F | Ht 63.0 in | Wt 106.2 lb

## 2024-10-18 DIAGNOSIS — R600 Localized edema: Secondary | ICD-10-CM

## 2024-10-18 DIAGNOSIS — R140 Abdominal distension (gaseous): Secondary | ICD-10-CM

## 2024-10-18 DIAGNOSIS — D649 Anemia, unspecified: Secondary | ICD-10-CM

## 2024-10-18 DIAGNOSIS — R809 Proteinuria, unspecified: Secondary | ICD-10-CM

## 2024-10-18 LAB — COMPREHENSIVE METABOLIC PANEL WITH GFR
ALT: 14 U/L (ref 0–35)
AST: 33 U/L (ref 0–37)
Albumin: 3.2 g/dL — ABNORMAL LOW (ref 3.5–5.2)
Alkaline Phosphatase: 89 U/L (ref 39–117)
BUN: 3 mg/dL — ABNORMAL LOW (ref 6–23)
CO2: 29 meq/L (ref 19–32)
Calcium: 8.7 mg/dL (ref 8.4–10.5)
Chloride: 102 meq/L (ref 96–112)
Creatinine, Ser: 0.45 mg/dL (ref 0.40–1.20)
GFR: 110.93 mL/min (ref >60.00)
Glucose, Bld: 108 mg/dL — ABNORMAL HIGH (ref 70–99)
Potassium: 4.4 meq/L (ref 3.5–5.1)
Sodium: 138 meq/L (ref 135–145)
Total Bilirubin: 1.5 mg/dL — ABNORMAL HIGH (ref 0.2–1.2)
Total Protein: 6.7 g/dL (ref 6.0–8.3)

## 2024-10-18 LAB — CBC WITH DIFFERENTIAL/PLATELET
Basophils Absolute: 0.1 K/uL (ref 0.0–0.1)
Basophils Relative: 1 % (ref 0.0–3.0)
Eosinophils Absolute: 0.1 K/uL (ref 0.0–0.7)
Eosinophils Relative: 1.4 % (ref 0.0–5.0)
HCT: 32.1 % — ABNORMAL LOW (ref 36.0–46.0)
Hemoglobin: 10.8 g/dL — ABNORMAL LOW (ref 12.0–15.0)
Lymphocytes Relative: 41.1 % (ref 12.0–46.0)
Lymphs Abs: 2.3 K/uL (ref 0.7–4.0)
MCHC: 33.8 g/dL (ref 30.0–36.0)
MCV: 96.5 fl (ref 78.0–100.0)
Monocytes Absolute: 0.8 K/uL (ref 0.1–1.0)
Monocytes Relative: 14.4 % — ABNORMAL HIGH (ref 3.0–12.0)
Neutro Abs: 2.3 K/uL (ref 1.4–7.7)
Neutrophils Relative %: 42.1 % — ABNORMAL LOW (ref 43.0–77.0)
Platelets: 453 K/uL — ABNORMAL HIGH (ref 150.0–400.0)
RBC: 3.33 Mil/uL — ABNORMAL LOW (ref 3.87–5.11)
RDW: 13.5 % (ref 11.5–15.5)
WBC: 5.6 K/uL (ref 4.0–10.5)

## 2024-10-18 LAB — TSH: TSH: 1.47 u[IU]/mL (ref 0.35–5.50)

## 2024-10-18 NOTE — Progress Notes (Signed)
 Leron Glance, NP-C Phone: [phone]  Mary Haney is a 53 y.o. female who presents today for ankle edema.   Discussed the use of AI scribe software for clinical note transcription with the patient, who gave verbal consent to proceed.  History of Present Illness   Mary Haney is a 53 year old female with diabetes who presents with lower extremity swelling and pain.  The swelling and pain in her lower extremities began approximately two weeks ago, initially affecting the back of her thighs and making it difficult to walk up stairs. Subsequently, her right foot began to swell without pain, followed by swelling in her left foot. Over the past week, she has experienced pain and swelling in her calves as well.  She has attempted home remedies such as drinking herbal tea, applying castor oil, and elevating her feet, which provide temporary relief. However, she finds it challenging to maintain elevation due to work commitments, and the swelling and pain persist, impacting her daily activities. The swelling is present upon waking and improves slightly after elevating her legs, although she struggles with sleep due to discomfort. She describes the sensation of 'fattiness' in her swollen feet and reports soreness to touch.  No chest pain, shortness of breath, or abdominal pain. Her blood sugar levels have been well-controlled with the use of a Dexcom sensor.  She has a history of diabetes and has recently stopped drinking alcohol for nearly three months. She continues to smoke. She is not currently on any blood pressure medications.      Social History   Tobacco Use  Smoking Status Every Day   Current packs/day: 0.50   Average packs/day: 0.5 packs/day for 25.0 years (12.5 ttl pk-yrs)   Types: Cigarettes  Smokeless Tobacco Never  Tobacco Comments   Has Chantix     Current Outpatient Medications on File Prior to Visit  Medication Sig Dispense Refill   albuterol  (PROAIR  HFA) 108 (90  Base) MCG/ACT inhaler Inhale 2 puffs into the lungs every 6 (six) hours as needed for wheezing or shortness of breath. 1 each 1   BAQSIMI  ONE PACK 3 MG/DOSE POWD PLACE 3 MG INTO THE NOSE ONCE AS NEEDED FOR UP TO 1 DOSE 1 each 11   BD PEN NEEDLE SHORT ULTRAFINE 31G X 8 MM MISC USE FIVE TIMES DAILY 100 each 11   cetirizine (ZYRTEC) 10 MG tablet Take 10 mg by mouth daily as needed for allergies.      Continuous Glucose Sensor (DEXCOM G7 SENSOR) MISC 3 each by Does not apply route every 30 (thirty) days. Apply 1 sensor every 10 days 9 each 4   hydrocortisone  (ANUSOL -HC) 25 MG suppository Place 1 suppository (25 mg total) rectally 2 (two) times daily. 12 suppository 0   insulin  aspart (NOVOLOG  FLEXPEN) 100 UNIT/ML FlexPen Inject 2-9 Units into the skin 3 (three) times daily with meals.     insulin  detemir (LEVEMIR  FLEXPEN) 100 UNIT/ML FlexPen Inject 12 Units into the skin daily.     Insulin  Pen Needle (B-D UF III MINI PEN NEEDLES) 31G X 5 MM MISC USE 5x WHEN TAKING INSULIN . 300 each 3   Insulin  Syringe-Needle U-100 (BD INSULIN  SYRINGE U/F) 30G X 1/2 0.5 ML MISC USE 2x a day WHEN TAKING INSULIN  200 each 3   PARoxetine  (PAXIL ) 20 MG tablet Take 1 tablet (20 mg total) by mouth every morning. 30 tablet 3   triamcinolone  ointment (KENALOG ) 0.1 % APPLY 1 APPLICATION TOPICALLY 2 TIMES DAILY AS NEEDED. LIMIT USE DUE RISK  OF SKIN DISCOLORATION. 453.6 g 0   No current facility-administered medications on file prior to visit.     ROS see history of present illness  Objective  Physical Exam Vitals:   10/18/24 0810 10/18/24 0831  BP: (!) 150/86 (!) 152/90  Pulse: 92   Temp: 98 F (36.7 C)   SpO2: 99%     BP Readings from Last 3 Encounters:  10/18/24 (!) 152/90  03/22/24 130/80  03/17/24 136/80   Wt Readings from Last 3 Encounters:  10/18/24 106 lb 3.2 oz (48.2 kg)  03/22/24 102 lb 6.4 oz (46.4 kg)  03/17/24 100 lb 3.2 oz (45.5 kg)    Physical Exam Constitutional:      General: She is not  in acute distress.    Appearance: Normal appearance.  HENT:     Head: Normocephalic.  Cardiovascular:     Rate and Rhythm: Normal rate and regular rhythm.     Heart sounds: Normal heart sounds.  Pulmonary:     Effort: Pulmonary effort is normal.     Breath sounds: Normal breath sounds.  Musculoskeletal:     Right lower leg: Tenderness present. 2+ Pitting Edema present.     Left lower leg: Tenderness present. 2+ Pitting Edema present.     Comments: No erythema or warmth present.   Skin:    General: Skin is warm and dry.  Neurological:     General: No focal deficit present.     Mental Status: She is alert.  Psychiatric:        Mood and Affect: Mood normal.        Behavior: Behavior normal.      Assessment/Plan: Please see individual problem list.  Bilateral lower extremity edema Assessment & Plan: Significant pain and swelling in bilateral lower extremities. Check lab work as outlined and STAT Venous US  ordered to rule out DVT. Further work up pending results. Strict precautions given to patient.   Orders: -     CBC with Differential/Platelet -     Comprehensive metabolic panel with GFR -     TSH -     US  Venous Img Lower Bilateral (DVT); Future      No follow-ups on file.   Leron Glance, NP-C [facility]

## 2024-10-18 NOTE — Assessment & Plan Note (Signed)
 Significant pain and swelling in bilateral lower extremities. Check lab work as outlined and STAT Venous US  ordered to rule out DVT. Further work up pending results. Strict precautions given to patient.

## 2024-10-19 ENCOUNTER — Other Ambulatory Visit

## 2024-10-19 ENCOUNTER — Other Ambulatory Visit (INDEPENDENT_AMBULATORY_CARE_PROVIDER_SITE_OTHER)

## 2024-10-19 ENCOUNTER — Ambulatory Visit

## 2024-10-19 DIAGNOSIS — R809 Proteinuria, unspecified: Secondary | ICD-10-CM | POA: Diagnosis not present

## 2024-10-19 DIAGNOSIS — R140 Abdominal distension (gaseous): Secondary | ICD-10-CM

## 2024-10-19 DIAGNOSIS — D649 Anemia, unspecified: Secondary | ICD-10-CM

## 2024-10-19 DIAGNOSIS — R600 Localized edema: Secondary | ICD-10-CM

## 2024-10-19 NOTE — Addendum Note (Signed)
 Addended by: MARYLEN PRO A on: 10/19/2024 12:32 PM   Modules accepted: Orders

## 2024-10-20 ENCOUNTER — Ambulatory Visit: Payer: Self-pay | Admitting: Nurse Practitioner

## 2024-10-20 ENCOUNTER — Other Ambulatory Visit: Payer: Self-pay | Admitting: Nurse Practitioner

## 2024-10-20 DIAGNOSIS — R140 Abdominal distension (gaseous): Secondary | ICD-10-CM

## 2024-10-20 DIAGNOSIS — R600 Localized edema: Secondary | ICD-10-CM

## 2024-10-20 DIAGNOSIS — R7989 Other specified abnormal findings of blood chemistry: Secondary | ICD-10-CM

## 2024-10-20 LAB — URINALYSIS, ROUTINE W REFLEX MICROSCOPIC
Bilirubin Urine: NEGATIVE
Hgb urine dipstick: NEGATIVE
Ketones, ur: NEGATIVE
Leukocytes,Ua: NEGATIVE
Nitrite: NEGATIVE
Protein, ur: NEGATIVE
Specific Gravity, Urine: 1.016 (ref 1.001–1.035)
pH: 6.5 (ref 5.0–8.0)

## 2024-10-20 LAB — HEPATIC FUNCTION PANEL
AG Ratio: 1 (calc) (ref 1.0–2.5)
ALT: 12 U/L (ref 6–29)
AST: 28 U/L (ref 10–35)
Albumin: 3.1 g/dL — ABNORMAL LOW (ref 3.6–5.1)
Alkaline phosphatase (APISO): 78 U/L (ref 37–153)
Bilirubin, Direct: 0.6 mg/dL — ABNORMAL HIGH (ref 0.0–0.2)
Globulin: 3.2 g/dL (ref 1.9–3.7)
Indirect Bilirubin: 0.7 mg/dL (ref 0.2–1.2)
Total Bilirubin: 1.3 mg/dL — ABNORMAL HIGH (ref 0.2–1.2)
Total Protein: 6.3 g/dL (ref 6.1–8.1)

## 2024-10-20 LAB — RETICULOCYTES
ABS Retic: 79000 {cells}/uL (ref 20000–80000)
Retic Ct Pct: 2.5 %

## 2024-10-20 LAB — IBC + FERRITIN
Ferritin: 1221.7 ng/mL — ABNORMAL HIGH (ref 10.0–291.0)
Iron: 62 ug/dL (ref 42–145)
Saturation Ratios: 32.8 % (ref 20.0–50.0)
TIBC: 189 ug/dL — ABNORMAL LOW (ref 250.0–450.0)
Transferrin: 135 mg/dL — ABNORMAL LOW (ref 212.0–360.0)

## 2024-10-20 LAB — MICROALBUMIN / CREATININE URINE RATIO
Creatinine, Urine: 66 mg/dL (ref 20–275)
Microalb Creat Ratio: 9 mg/g{creat} (ref <30)
Microalb, Ur: 0.6 mg/dL

## 2024-10-20 LAB — HAPTOGLOBIN: Haptoglobin: 50 mg/dL (ref 43–212)

## 2024-10-20 LAB — B12 AND FOLATE PANEL
Folate: 12.3 ng/mL
Vitamin B-12: 1413 pg/mL — ABNORMAL HIGH (ref 200–1100)

## 2024-10-24 ENCOUNTER — Ambulatory Visit

## 2024-10-28 ENCOUNTER — Other Ambulatory Visit: Payer: Self-pay | Admitting: Internal Medicine

## 2024-10-28 ENCOUNTER — Encounter: Payer: Self-pay | Admitting: Internal Medicine

## 2024-10-28 DIAGNOSIS — E1065 Type 1 diabetes mellitus with hyperglycemia: Secondary | ICD-10-CM

## 2024-11-01 ENCOUNTER — Inpatient Hospital Stay

## 2024-11-01 ENCOUNTER — Inpatient Hospital Stay: Attending: Oncology | Admitting: Oncology

## 2024-11-01 ENCOUNTER — Encounter: Payer: Self-pay | Admitting: Oncology

## 2024-11-01 VITALS — BP 164/87 | HR 94 | Temp 97.6°F | Resp 18 | Wt 102.4 lb

## 2024-11-01 DIAGNOSIS — K760 Fatty (change of) liver, not elsewhere classified: Secondary | ICD-10-CM | POA: Insufficient documentation

## 2024-11-01 DIAGNOSIS — R7989 Other specified abnormal findings of blood chemistry: Secondary | ICD-10-CM | POA: Insufficient documentation

## 2024-11-01 DIAGNOSIS — F17210 Nicotine dependence, cigarettes, uncomplicated: Secondary | ICD-10-CM | POA: Diagnosis not present

## 2024-11-01 DIAGNOSIS — Z720 Tobacco use: Secondary | ICD-10-CM | POA: Insufficient documentation

## 2024-11-01 DIAGNOSIS — D649 Anemia, unspecified: Secondary | ICD-10-CM | POA: Diagnosis present

## 2024-11-01 DIAGNOSIS — M25572 Pain in left ankle and joints of left foot: Secondary | ICD-10-CM | POA: Diagnosis not present

## 2024-11-01 DIAGNOSIS — N951 Menopausal and female climacteric states: Secondary | ICD-10-CM | POA: Diagnosis not present

## 2024-11-01 DIAGNOSIS — Z79899 Other long term (current) drug therapy: Secondary | ICD-10-CM | POA: Diagnosis not present

## 2024-11-01 HISTORY — DX: Pain in left ankle and joints of left foot: M25.572

## 2024-11-01 LAB — CBC WITH DIFFERENTIAL/PLATELET
Abs Immature Granulocytes: 0.01 K/uL (ref 0.00–0.07)
Basophils Absolute: 0 K/uL (ref 0.0–0.1)
Basophils Relative: 1 %
Eosinophils Absolute: 0.1 K/uL (ref 0.0–0.5)
Eosinophils Relative: 1 %
HCT: 31.5 % — ABNORMAL LOW (ref 36.0–46.0)
Hemoglobin: 10.5 g/dL — ABNORMAL LOW (ref 12.0–15.0)
Immature Granulocytes: 0 %
Lymphocytes Relative: 46 %
Lymphs Abs: 2.1 K/uL (ref 0.7–4.0)
MCH: 30.6 pg (ref 26.0–34.0)
MCHC: 33.3 g/dL (ref 30.0–36.0)
MCV: 91.8 fL (ref 80.0–100.0)
Monocytes Absolute: 0.6 K/uL (ref 0.1–1.0)
Monocytes Relative: 13 %
Neutro Abs: 1.8 K/uL (ref 1.7–7.7)
Neutrophils Relative %: 39 %
Platelets: 318 K/uL (ref 150–400)
RBC: 3.43 MIL/uL — ABNORMAL LOW (ref 3.87–5.11)
RDW: 12.8 % (ref 11.5–15.5)
WBC: 4.6 K/uL (ref 4.0–10.5)
nRBC: 0 % (ref 0.0–0.2)

## 2024-11-01 LAB — RETIC PANEL
Immature Retic Fract: 6.8 % (ref 2.3–15.9)
RBC.: 3.44 MIL/uL — ABNORMAL LOW (ref 3.87–5.11)
Retic Count, Absolute: 76.7 K/uL (ref 19.0–186.0)
Retic Ct Pct: 2.2 % (ref 0.4–3.1)
Reticulocyte Hemoglobin: 33.7 pg (ref >27.9)

## 2024-11-01 LAB — IRON AND TIBC
Iron: 61 ug/dL (ref 28–170)
Saturation Ratios: 25 % (ref 10.4–31.8)
TIBC: 242 ug/dL — ABNORMAL LOW (ref 250–450)
UIBC: 181 ug/dL

## 2024-11-01 LAB — LACTATE DEHYDROGENASE: LDH: 189 U/L (ref 105–235)

## 2024-11-01 LAB — FERRITIN: Ferritin: 1313 ng/mL — ABNORMAL HIGH (ref 11–307)

## 2024-11-01 NOTE — Assessment & Plan Note (Signed)
 Check cbc, myeloma panel, light chain ratio, LDH, haptoglobin,

## 2024-11-01 NOTE — Progress Notes (Signed)
 Hematology/Oncology Consult note [phone] [phone]        REFERRING PROVIDER: Gretel App, NP   CHIEF COMPLAINTS/REASON FOR VISIT:  Evaluation of elevated ferritin   ASSESSMENT & PLAN:   Elevated ferritin Reactive vs iron overload.  Check hemochromatosis mutation.  Avoid alcohol, iron supplementation.   Hepatic steatosis Likely fatty liver disease due to alcohol use.  She has stopped alcohol.  monitor  Tobacco use Recommend smoke cessation.  Refer to lung cancer screening program  Acute left ankle pain Check uric acid  Normocytic anemia Check cbc, myeloma panel, light chain ratio, LDH, haptoglobin,    Orders Placed This Encounter  Procedures   Ferritin    Standing Status:   Future    Number of Occurrences:   1    Expected Date:   11/01/2024    Expiration Date:   01/30/2025   Iron and TIBC    Standing Status:   Future    Number of Occurrences:   1    Expected Date:   11/01/2024    Expiration Date:   01/30/2025   CBC with Differential/Platelet    Standing Status:   Future    Number of Occurrences:   1    Expected Date:   11/01/2024    Expiration Date:   01/30/2025   Retic Panel    Standing Status:   Future    Number of Occurrences:   1    Expected Date:   11/01/2024    Expiration Date:   01/30/2025   Multiple Myeloma Panel (SPEP&IFE w/QIG)    Standing Status:   Future    Number of Occurrences:   1    Expected Date:   11/01/2024    Expiration Date:   01/30/2025   Kappa/lambda light chains    Standing Status:   Future    Number of Occurrences:   1    Expected Date:   11/01/2024    Expiration Date:   01/30/2025   Lactate dehydrogenase    Standing Status:   Future    Number of Occurrences:   1    Expected Date:   11/01/2024    Expiration Date:   01/30/2025   Haptoglobin    Standing Status:   Future    Number of Occurrences:   1    Expected Date:   11/01/2024    Expiration Date:   01/30/2025   Uric acid    Standing Status:    Future    Number of Occurrences:   1    Expected Date:   11/01/2024    Expiration Date:   01/30/2025   Hemochromatosis DNA-PCR(c282y,h63d)    Standing Status:   Future    Number of Occurrences:   1    Expected Date:   11/01/2024    Expiration Date:   11/01/2025    All questions were answered. The patient knows to call the clinic with any problems, questions or concerns.  Zelphia Cap, MD, PhD [facility] Hematology Oncology 11/01/2024   HISTORY OF PRESENTING ILLNESS:   Mary Haney is a  53 y.o.  female with PMH listed below was seen in consultation at the request of  Gretel App, NP  for evaluation of elevated ferritin.  Discussed the use of AI scribe software for clinical note transcription with the patient, who gave verbal consent to proceed.   he has been experiencing significant pain in her feet, ankles, and calves for the past four weeks, severely impacting her  ability to walk. No stomach pain, unintentional weight loss, or night sweats, although she notes difficulty gaining weight and is experiencing hot flashes associated with premenopause.  Recent blood work showed low blood counts and high iron levels. There is a history of alcohol use, which she quit approximately four months ago. She smokes a pack of cigarettes daily for over 30 years and consumes herbal tea regularly. She regularly consumes herbal tea.   MEDICAL HISTORY:  Past Medical History:  Diagnosis Date   Alcoholic pancreatitis    Asthma    Has albuterol  inhaler   Diabetes mellitus without complication (HCC)    Type 2 diagnosed 2013    SURGICAL HISTORY: Past Surgical History:  Procedure Laterality Date   COLONOSCOPY WITH PROPOFOL  N/A 02/11/2018   Procedure: COLONOSCOPY WITH PROPOFOL ;  Surgeon: Unk Corinn Skiff, MD;  Location: [facility] ENDOSCOPY;  Service: Gastroenterology;  Laterality: N/A;   NO PAST SURGERIES      SOCIAL HISTORY: Social History   Socioeconomic History   Marital status: Single     Spouse name: Not on file   Number of children: 0   Years of education: 14   Highest education level: Not on file  Occupational History   Occupation: Take Dealer: [employer]    Comment: [employer]  Tobacco Use   Smoking status: Every Day    Current packs/day: 0.50    Average packs/day: 0.5 packs/day for 25.0 years (12.5 ttl pk-yrs)    Types: Cigarettes   Smokeless tobacco: Never   Tobacco comments:    Has Chantix   Substance and Sexual Activity   Alcohol use: Not Currently    Alcohol/week: 3.0 standard drinks of alcohol    Types: 3 Cans of beer per week   Drug use: No   Sexual activity: Yes    Birth control/protection: Condom  Other Topics Concern   Not on file  Social History Narrative   Mary Haney was born in [place], [place]. She moved with her family to [place]  at age 42. She attended 2 years at [employer] and was majoring in Asian Studies. Her goal is to become a nurse, learning disability in the [employer]. Spencer lives with her boyfriend, Kip Foushee. They are considering getting married soon. She enjoys sketching outdoor scenery, cartoons and loves to read.      Previously worked at [employer] 09/2023   Social Drivers of Corporate Investment Banker Strain: Not on [employer] Insecurity: Not on file  Transportation Needs: Not on file  Physical Activity: Not on file  Stress: Not on file  Social Connections: Not on file  Intimate Partner Violence: Not At Risk (11/05/2023)   Received from [facility]   HITS    Over the last 12 months how often did your partner physically hurt you?: Never    Over the last 12 months how often did your partner insult you or talk down to you?: Never    Over the last 12 months how often did your partner threaten you with physical harm?: Never    Over the last 12 months how often did your partner scream or curse at you?: Never    FAMILY HISTORY: Family History  Problem Relation Age of Onset   Diabetes Paternal Aunt    Diabetes  Paternal Uncle    Diabetes Paternal Grandmother    Depression Paternal Grandmother    Diabetes Paternal Grandfather    Depression Paternal Grandfather    Diabetes Cousin  Depression Cousin    Breast cancer Neg Hx    Arthritis/Rheumatoid Neg Hx     ALLERGIES:  has no known allergies.  MEDICATIONS:  Current Outpatient Medications  Medication Sig Dispense Refill   albuterol  (PROAIR  HFA) 108 (90 Base) MCG/ACT inhaler Inhale 2 puffs into the lungs every 6 (six) hours as needed for wheezing or shortness of breath. 1 each 1   BAQSIMI  ONE PACK 3 MG/DOSE POWD PLACE 3 MG INTO THE NOSE ONCE AS NEEDED FOR UP TO 1 DOSE 1 each 11   BD PEN NEEDLE SHORT ULTRAFINE 31G X 8 MM MISC USE FIVE TIMES DAILY 100 each 11   cetirizine (ZYRTEC) 10 MG tablet Take 10 mg by mouth daily as needed for allergies.      Continuous Glucose Sensor (DEXCOM G7 SENSOR) MISC 3 each by Does not apply route every 30 (thirty) days. Apply 1 sensor every 10 days 9 each 4   hydrocortisone  (ANUSOL -HC) 25 MG suppository Place 1 suppository (25 mg total) rectally 2 (two) times daily. 12 suppository 0   insulin  aspart (NOVOLOG  FLEXPEN) 100 UNIT/ML FlexPen INJECT 2-9 UNITS INTO THE SKIN 3 (THREE) TIMES DAILY WITH MEALS. 15 mL 1   insulin  detemir (LEVEMIR  FLEXPEN) 100 UNIT/ML FlexPen Inject 12 Units into the skin daily.     Insulin  Pen Needle (B-D UF III MINI PEN NEEDLES) 31G X 5 MM MISC USE 5x WHEN TAKING INSULIN . 300 each 3   Insulin  Syringe-Needle U-100 (BD INSULIN  SYRINGE U/F) 30G X 1/2 0.5 ML MISC USE 2x a day WHEN TAKING INSULIN  200 each 3   PARoxetine  (PAXIL ) 20 MG tablet Take 1 tablet (20 mg total) by mouth every morning. 30 tablet 3   triamcinolone  ointment (KENALOG ) 0.1 % APPLY TOPICALLY 2 TIMES DAILY AS NEEDED. LIMIT USE DUE RISK OF SKIN DISCOLORATION 240 g 1   No current facility-administered medications for this visit.    Review of Systems  Constitutional:  Negative for appetite change, chills, fatigue and fever.  HENT:    Negative for hearing loss and voice change.   Eyes:  Negative for eye problems.  Respiratory:  Negative for chest tightness and cough.   Cardiovascular:  Negative for chest pain.  Gastrointestinal:  Negative for abdominal distention, abdominal pain and blood in stool.  Endocrine: Negative for hot flashes.  Genitourinary:  Negative for difficulty urinating and frequency.   Musculoskeletal:  Positive for arthralgias.  Skin:  Negative for itching and rash.  Neurological:  Negative for extremity weakness.  Hematological:  Negative for adenopathy.  Psychiatric/Behavioral:  Negative for confusion.    PHYSICAL EXAMINATION:  Vitals:   11/01/24 1506 11/01/24 1519  BP: (!) 168/88 (!) 164/87  Pulse: 94   Resp: 18   Temp: 97.6 F (36.4 C)   SpO2: 100%    Filed Weights   11/01/24 1506  Weight: 102 lb 6.4 oz (46.4 kg)    Physical Exam Constitutional:      General: She is not in acute distress. HENT:     Head: Normocephalic and atraumatic.  Eyes:     General: No scleral icterus. Cardiovascular:     Rate and Rhythm: Normal rate and regular rhythm.  Pulmonary:     Effort: Pulmonary effort is normal. No respiratory distress.     Breath sounds: Normal breath sounds. No wheezing.  Abdominal:     General: Bowel sounds are normal. There is no distension.     Palpations: Abdomen is soft.  Musculoskeletal:  General: No deformity. Normal range of motion.     Cervical back: Normal range of motion and neck supple.     Comments: Left ankle swelling, tender with motion  Skin:    General: Skin is warm and dry.     Findings: No erythema or rash.  Neurological:     Mental Status: She is alert and oriented to person, place, and time. Mental status is at baseline.  Psychiatric:        Mood and Affect: Mood normal.     LABORATORY DATA:  I have reviewed the data as listed    Latest Ref Rng & Units 11/01/2024    3:42 PM 10/18/2024    8:35 AM 03/24/2024   10:32 AM  CBC  WBC 4.0 -  10.5 K/uL 4.6  5.6  3.3   Hemoglobin 12.0 - 15.0 g/dL 89.4  89.1  87.4   Hematocrit 36.0 - 46.0 % 31.5  32.1  37.6   Platelets 150 - 400 K/uL 318  453.0  304.0       Latest Ref Rng & Units 10/19/2024    3:45 PM 10/18/2024    8:35 AM 03/24/2024   10:32 AM  CMP  Glucose 70 - 99 mg/dL  891  781   BUN 6 - 23 mg/dL  3  3   Creatinine 9.59 - 1.20 mg/dL  9.54  9.41   Sodium 864 - 145 mEq/L  138  134   Potassium 3.5 - 5.1 mEq/L  4.4  4.2   Chloride 96 - 112 mEq/L  102  94   CO2 19 - 32 mEq/L  29  29   Calcium  8.4 - 10.5 mg/dL  8.7  9.1   Total Protein 6.1 - 8.1 g/dL 6.3  6.7  7.4   Total Bilirubin 0.2 - 1.2 mg/dL 1.3  1.5  0.9   Alkaline Phos 39 - 117 U/L  89  153   AST 10 - 35 U/L 28  33  398   ALT 6 - 29 U/L 12  14  107       RADIOGRAPHIC STUDIES: I have personally reviewed the radiological images as listed and agreed with the findings in the report. US  Venous Img Lower Bilateral (DVT) Result Date: 10/18/2024 CLINICAL DATA:  BILATERAL lower extremity pain and edema for 2 weeks. EXAM: Bilateral Lower Extremity Venous Doppler Ultrasound TECHNIQUE: Gray-scale sonography with compression, as well as color and duplex ultrasound, were performed to evaluate the deep venous system(s) from the level of the common femoral vein through the popliteal and proximal calf veins. COMPARISON:  None available FINDINGS: VENOUS Normal compressibility of the common femoral, superficial femoral, and popliteal veins, as well as the visualized calf veins. Visualized portions of profunda femoral vein and great saphenous vein unremarkable. No filling defects to suggest DVT on grayscale or color Doppler imaging. Doppler waveforms show normal direction of venous flow, normal respiratory plasticity and response to augmentation. OTHER BILATERAL nonenlarged inguinal lymph nodes are seen. Limitations: none IMPRESSION: No lower extremity DVT. Electronically Signed   By: Aliene Lloyd M.D.   On: 10/18/2024 10:21

## 2024-11-01 NOTE — Assessment & Plan Note (Signed)
 Check uric acid.

## 2024-11-01 NOTE — Assessment & Plan Note (Signed)
 Likely fatty liver disease due to alcohol use.  She has stopped alcohol.  monitor

## 2024-11-01 NOTE — Assessment & Plan Note (Signed)
Recommend smoke cessation.  Refer to lung cancer screening program.

## 2024-11-01 NOTE — Assessment & Plan Note (Signed)
 Reactive vs iron overload.  Check hemochromatosis mutation.  Avoid alcohol, iron supplementation.

## 2024-11-02 LAB — URIC ACID: Uric Acid, Serum: 2.4 mg/dL — ABNORMAL LOW (ref 2.5–7.1)

## 2024-11-02 LAB — KAPPA/LAMBDA LIGHT CHAINS
Kappa free light chain: 33.9 mg/L — ABNORMAL HIGH (ref 3.3–19.4)
Kappa, lambda light chain ratio: 1.41 (ref 0.26–1.65)
Lambda free light chains: 24 mg/L (ref 5.7–26.3)

## 2024-11-03 LAB — HAPTOGLOBIN: Haptoglobin: 47 mg/dL (ref 33–346)

## 2024-11-04 LAB — MULTIPLE MYELOMA PANEL, SERUM
Albumin SerPl Elph-Mcnc: 3.2 g/dL (ref 2.9–4.4)
Albumin/Glob SerPl: 1 (ref 0.7–1.7)
Alpha 1: 0.2 g/dL (ref 0.0–0.4)
Alpha2 Glob SerPl Elph-Mcnc: 0.5 g/dL (ref 0.4–1.0)
B-Globulin SerPl Elph-Mcnc: 1.1 g/dL (ref 0.7–1.3)
Gamma Glob SerPl Elph-Mcnc: 1.6 g/dL (ref 0.4–1.8)
Globulin, Total: 3.5 g/dL (ref 2.2–3.9)
IgA: 609 mg/dL — ABNORMAL HIGH (ref 87–352)
IgG (Immunoglobin G), Serum: 1648 mg/dL — ABNORMAL HIGH (ref 586–1602)
IgM (Immunoglobulin M), Srm: 79 mg/dL (ref 26–217)
Total Protein ELP: 6.7 g/dL (ref 6.0–8.5)

## 2024-11-07 ENCOUNTER — Ambulatory Visit: Admitting: Internal Medicine

## 2024-11-07 ENCOUNTER — Encounter: Payer: Self-pay | Admitting: Internal Medicine

## 2024-11-07 VITALS — BP 142/80 | HR 108 | Ht 63.0 in | Wt 104.2 lb

## 2024-11-07 DIAGNOSIS — E049 Nontoxic goiter, unspecified: Secondary | ICD-10-CM

## 2024-11-07 DIAGNOSIS — E1065 Type 1 diabetes mellitus with hyperglycemia: Secondary | ICD-10-CM

## 2024-11-07 LAB — POCT GLYCOSYLATED HEMOGLOBIN (HGB A1C): Hemoglobin A1C: 6.9 % — AB (ref 4.0–5.6)

## 2024-11-07 LAB — HEMOCHROMATOSIS DNA-PCR(C282Y,H63D)

## 2024-11-07 MED ORDER — NOVOLOG FLEXPEN 100 UNIT/ML ~~LOC~~ SOPN: 2.0000 [IU] | PEN_INJECTOR | Freq: Three times a day (TID) | SUBCUTANEOUS | 3 refills | Status: AC

## 2024-11-07 MED ORDER — FLUCONAZOLE 150 MG PO TABS: 150.0000 mg | ORAL_TABLET | Freq: Once | ORAL | 1 refills | Status: AC

## 2024-11-07 MED ORDER — DEXCOM G7 SENSOR MISC: 3.0000 | 4 refills | Status: AC

## 2024-11-07 MED ORDER — TRESIBA FLEXTOUCH 100 UNIT/ML ~~LOC~~ SOPN: 7.0000 [IU] | PEN_INJECTOR | Freq: Every day | SUBCUTANEOUS | 3 refills | Status: AC

## 2024-11-07 NOTE — Patient Instructions (Addendum)
 Please change from Levemir  to: - Tresiba  7 units in a.m.  Continue: - Novolog  10-15 minutes before each meal: 2 units before a smaller meal 3 units before a regular meal 4 units before a large meal Bolus 1 unit for coffee.  Please come back for a follow-up appointment in 3-4 months.

## 2024-11-07 NOTE — Progress Notes (Signed)
 Patient ID: Mary Haney, female   DOB: [birth year], 53 y.o.   MRN: [id number]  HPI: Mary Haney is a 53 y.o.-year-old female, returning for f/u for DM, dx 2013, insulin -dependent, uncontrolled, without long term complications. Last visit was 7 months ago.  She lost her insurance after her last visit.  Interim history: No increased urination, blurry vision, nausea. Patient had a lot of stress after being fired in 08/2023 after a change in management at work.  She now has [employer].  She is still out of work.  She has significant foot, ankle, calf pain.  Evaluation for DVT with LE Dopplers was negative. She was recently found to have a high ferritin.  She is currently investigated for hemochromatosis.  Reviewed history: In 07/2012 she went to the ED because she was not feeling well. She was found to have a blood glucose level of 600 + and was admitted with DKA. She was followed by Dr. Damian immediately following initial diagnosis, then switched to [facility] endocrinology.   In 10/2013, due to lack of finances, she was stretching the insulin  doses >> sugars high >> low CBG 25-26 >> ED.    She was admitted in 04/2016 for DKA.  At the end of 2017, she met the DM educator and nutritionist >> decided for Omnipod pump >> but nobody called her back and then she could not afford it anymore.  She decided against that insulin  pump, afterwards.  Reviewed HbA1c levels: Lab Results  Component Value Date   HGBA1C 6.1 (A) 03/22/2024   HGBA1C 6.4 (A) 01/19/2024   HGBA1C 6.7 (A) 11/20/2023   HGBA1C 6.5 06/15/2023   HGBA1C 8.9 (A) 01/09/2023   HGBA1C 7.4 (A) 08/18/2022   HGBA1C 6.3 (A) 04/21/2022   HGBA1C 6.3 (A) 12/16/2021   HGBA1C 6.1 (A) 07/29/2021   HGBA1C 6.5 (A) 03/18/2021   HGBA1C 7.1 (A) 12/17/2020   HGBA1C 7.9 (H) 07/25/2020   HGBA1C 8.8 (A) 04/02/2020   HGBA1C 8.7 (A) 12/27/2019   HGBA1C 6.9 (A) 09/20/2019   HGBA1C 8.5 (A) 06/14/2019   HGBA1C 9.1 (A) 02/15/2019   HGBA1C 7.4  (A) 06/22/2018   HGBA1C 7.7 03/18/2018   HGBA1C 7.9 12/17/2017   Pt is on a regimen of: - Levemir  3 units 2x a day >> 4 units in a.m. and 2 units at night >> 4 units in am and 3 units at night - Novolog  - ICR 1:13 for starches (including crackers) and with dinner but use 1:15 otherwise  -actually estimating: 2 units before a smaller meal 3 units before a regular meal 4 units before a large meal Bolus 1 unit for coffee.  Bolus 10-15 minutes before each meal. Bolus 1 unit for coffee.  She checks her sugars more than 4 times a day per review of her CGM:  Prev.:  Previously:    Lowest sugar was  38 >> 57 >> upper 50s >> 60s; she has hypoglycemia awareness in the 60s. She had an episode of hypoglycemia (? 20s, 38, 39 -11/05/2023) during which she was noted to have unusual behavior and then have seizure activity while eating with her family.  She was brought to the emergency room and blood sugar was corrected.   Highest sugar was 400s >> 200 >> 399 >> 300s.  Has ReliOn meter.  Pt's meals are: - coffee + cream + stevia right after she wakes up at 6:45 - Breakfast: oatmeal, yoghurt, sausage bisquit - - Lunch: salad, soup, sandwiches  - Dinner: meat + starch +  vegetables - Snacks: yoghurt, peanut crackers, nuts  -No CKD: Lab Results  Component Value Date   BUN 3 (L) 10/18/2024   Lab Results  Component Value Date   CREATININE 0.45 10/18/2024   Lab Results  Component Value Date   MICRALBCREAT 9 10/19/2024   MICRALBCREAT 119 (H) 03/22/2024   MICRALBCREAT 152 (H) 11/20/2023   MICRALBCREAT 40 (H) 02/25/2021   -She has a history of dyslipidemia: Lab Results  Component Value Date   CHOL 181 11/20/2023   HDL 95 11/20/2023   LDLCALC  11/20/2023     Comment:     . LDL cholesterol not calculated. Triglyceride levels greater than 400 mg/dL invalidate calculated LDL results. . Reference range: <100 . Desirable range <100 mg/dL for primary prevention;   <70 mg/dL for  patients with CHD or diabetic patients  with > or = 2 CHD risk factors. SABRA LDL-C is now calculated using the Martin-Hopkins  calculation, which is a validated novel method providing  better accuracy than the Friedewald equation in the  estimation of LDL-C.  Gladis APPLETHWAITE et al. SANDREA. 7986;689(80): 2061-2068  (http://education.[facility].com/faq/FAQ164)    LDLDIRECT 59.0 06/14/2019   TRIG 475 (H) 11/20/2023   CHOLHDL 1.9 11/20/2023  She has fatty liver per review of last PCPs note - stopped alcohol.  - last eye exam was in 01/06/2023: No DR  - no numbness and tingling in her feet.  Last foot exam 03/22/2024.    On 11/10/2023 she was in the emergency room with pain and shortness of breath.  She had an abnormal EKG and elevated troponin.  She has seen cardiology since then.  Ischemia was ruled out.    Latest TSH was normal: Lab Results  Component Value Date   TSH 1.47 10/18/2024   Her thyroid  remains palpable.  Pt denies: - feeling nodules in neck - hoarseness - dysphagia - choking  ROS: + see HPI  I reviewed pt's medications, allergies, PMH, social hx, family hx, and changes were documented in the history of present illness. Otherwise, unchanged from my initial visit note.  Past Medical History:  Diagnosis Date   Alcoholic pancreatitis    Asthma    Has albuterol  inhaler   Diabetes mellitus without complication (HCC)    Type 2 diagnosed 2013   Past Surgical History:  Procedure Laterality Date   COLONOSCOPY WITH PROPOFOL  N/A 02/11/2018   Procedure: COLONOSCOPY WITH PROPOFOL ;  Surgeon: Unk Corinn Skiff, MD;  Location: [facility] ENDOSCOPY;  Service: Gastroenterology;  Laterality: N/A;   NO PAST SURGERIES     History   Social History   Marital Status: Single    Spouse Name: N/A    Number of Children: 0   Years of Education: 14   Occupational History   Take Out Specialist     [employer]   Social History Main Topics   Smoking status: Current Every Day Smoker --  0.50 packs/day for 25 years    Types: Cigarettes   Smokeless tobacco: Never Used   Alcohol Use: 1.8 oz/week    3 Cans of beer per week   Drug Use: No   Sexual Activity: Yes    Birth Control/ Protection: Condom   Social History Narrative   Yoshika was born in [place], [place]. She moved with her family to [place]  at age 2. She attended 2 years at [employer] and was majoring in Asian Studies. Her goal is to become a nurse, learning disability in the [employer]. 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 Marlis lives with her boyfriend, Kip Foushee. They are considering getting married soon. She enjoys sketching outdoor scenery, cartoons and loves to read.   Current Outpatient Medications on File Prior to Visit  Medication Sig Dispense Refill   albuterol  (PROAIR  HFA) 108 (90 Base) MCG/ACT inhaler Inhale 2 puffs into the lungs every 6 (six) hours as needed for wheezing or shortness of breath. 1 each 1   BAQSIMI  ONE PACK 3 MG/DOSE POWD PLACE 3 MG INTO THE NOSE ONCE AS NEEDED FOR UP TO 1 DOSE 1 each 11   BD PEN NEEDLE SHORT ULTRAFINE 31G X 8 MM MISC USE FIVE TIMES DAILY 100 each 11   cetirizine (ZYRTEC) 10 MG tablet Take 10 mg by mouth daily as needed for allergies.      Continuous Glucose Sensor (DEXCOM G7 SENSOR) MISC 3 each by Does not apply route every 30 (thirty) days. Apply 1 sensor every 10 days 9 each 4   hydrocortisone  (ANUSOL -HC) 25 MG suppository Place 1 suppository (25 mg total) rectally 2 (two) times daily. 12 suppository 0   insulin  aspart (NOVOLOG  FLEXPEN) 100 UNIT/ML FlexPen INJECT 2-9 UNITS INTO THE SKIN 3 (THREE) TIMES DAILY WITH MEALS. 15 mL 1   insulin  detemir (LEVEMIR  FLEXPEN) 100 UNIT/ML FlexPen Inject 12 Units into the skin daily.     Insulin  Pen Needle (B-D UF III MINI PEN NEEDLES) 31G X 5 MM MISC USE 5x WHEN TAKING INSULIN . 300 each 3   Insulin  Syringe-Needle U-100 (BD INSULIN  SYRINGE U/F) 30G X 1/2 0.5 ML MISC USE 2x a day WHEN TAKING INSULIN  200 each 3    PARoxetine  (PAXIL ) 20 MG tablet Take 1 tablet (20 mg total) by mouth every morning. 30 tablet 3   triamcinolone  ointment (KENALOG ) 0.1 % APPLY TOPICALLY 2 TIMES DAILY AS NEEDED. LIMIT USE DUE RISK OF SKIN DISCOLORATION 240 g 1   No current facility-administered medications on file prior to visit.   No Known Allergies Family History  Problem Relation Age of Onset   Diabetes Paternal Aunt    Diabetes Paternal Uncle    Diabetes Paternal Grandmother    Depression Paternal Grandmother    Diabetes Paternal Grandfather    Depression Paternal Grandfather    Diabetes Cousin    Depression Cousin    Breast cancer Neg Hx    Arthritis/Rheumatoid Neg Hx    PE: BP (!) 142/80   Pulse (!) 108   Ht 5' 3 (1.6 m)   Wt 104 lb 3.2 oz (47.3 kg)   SpO2 98%   BMI 18.46 kg/m  Wt Readings from Last 20 Encounters:  11/07/24 104 lb 3.2 oz (47.3 kg)  11/01/24 102 lb 6.4 oz (46.4 kg)  10/18/24 106 lb 3.2 oz (48.2 kg)  03/22/24 102 lb 6.4 oz (46.4 kg)  03/17/24 100 lb 3.2 oz (45.5 kg)  01/19/24 104 lb (47.2 kg)  11/20/23 105 lb (47.6 kg)  11/10/23 108 lb 4 oz (49.1 kg)  11/10/23 108 lb 4 oz (49.1 kg)  07/14/23 109 lb (49.4 kg)  06/15/23 110 lb 6.4 oz (50.1 kg)  01/09/23 110 lb (49.9 kg)  10/08/22 111 lb 3.2 oz (50.4 kg)  08/18/22 110 lb 9.6 oz (50.2 kg)  06/04/22 118 lb 9.6 oz (53.8 kg)  04/21/22 118 lb 6.4 oz (53.7 kg)  12/16/21 120 lb 6.4 oz (54.6 kg)  07/29/21 123 lb 9.6 oz (56.1 kg)  03/18/21 124 lb 12.8 oz (56.6 kg)  02/08/21 130 lb 3.2 oz (  59.1 kg)   Constitutional: thin, in NAD Eyes: EOMI, no exophthalmos ENT: + slight, symmetric thyromegaly, no cervical lymphadenopathy Cardiovascular: Tachycardia, RR, No MRG Respiratory: CTA B Musculoskeletal: no deformities Skin: no rashes Neurological: + tremor with outstretched hands  ASSESSMENT: 1. DM, insulin -dependent, uncontrolled, with complications She - History of DKA  Component     Latest Ref Rng & Units 11/04/2016  Hemoglobin  A1C      9.0  C-Peptide     0.80 - 3.85 ng/mL <0.10 (L)  Glucose, Fasting     65 - 99 mg/dL 40 (L)  Glutamic Acid Decarb Ab     <5 IU/mL >250 (H)  Pancreatic Islet Cell Antibody     <5 JDF Units <5   Glucose very low at the time of the blood draw, therefore, C-peptide is not interpretable. However, GAD antibodies are undetectably high, confirming type 1 diabetes.  2.  Palpable thyroid   PLAN:  1. Patient with history of uncontrolled type 1 diabetes, on basal-bolus insulin  regimen, with high insulin  sensitivity, managed with low insulin  doses.  She had site irritation from the Dexcom G6 but tolerating the Dexcom G7 well.  A PA for this was approved for her.  In 2024, she had a hypoglycemic seizure.  At that time, sugars were in the 30s.  She was taken to the emergency room but had to leave AMA due to lack of insurance coverage. Reviewing the ED note from 10/2023, it was noted that she was drinking 12 beers a week.  We discussed about the fact that alcohol can lead to hypoglycemia.  Sugars improved afterwards. - At last visit, sugars were however worse, with nadirs around 3 AM and 5 PM.  It appeared that the root of the problem could have been the fact that she was not taking enough insulin  before dinner, only taking 2 units of NovoLog , with subsequent increase in blood sugars after this meal and then drops in blood sugars after correcting with 1 unit of NovoLog  around 12 AM.  Sugars improved afterwards but sometimes they were still dropping too low followed by a gradual increase in blood sugars in the first half of the day and peaking around 12 PM.  I advised her to take more insulin  before dinner and probably before the rest of the meals, 3 units and even increase to 4 units for larger meals.  We also discussed about trying not to correct postprandially since she was so insulin  sensitive.  We did not change the Levemir  dose at that time. CGM interpretation: -At today's visit, we reviewed her CGM  downloads: It appears that 32% of values are in target range (goal >70%), while 68% are higher than 180 (goal <25%), and 0% are lower than 70 (goal <4%).  The calculated average blood sugar is 222.  The projected HbA1c for the next 3 months (GMI) is 8.6%. -Reviewing the CGM trends, sugars are fluctuating mostly above the target range, with only occasional drops into the target interval and highest sugars after dinner and occasionally after lunch.  She does mention that she noted that her blood sugars have increased lately and she just increased her Levemir  dose at night from 2 units to 3 units in the last 2 weeks.  However, sugars remain elevated and we discussed that her Levemir  may be degraded/expired.  I advised her that this has been out of the market for quite a while and we will need to switch to another insulin .  I suggested Tresiba  to  hopefully avoid the need for 2 injections a day due to the long half-life of this particular insulin .  She agrees to try this if covered.  She does have Lantus  at home and we can use this twice a day if absolutely needed.  I advised her to let me know if the sugars do not improve after switching to the new insulin , in which case we would need an increase in doses, also. -For now, I advised her to continue the same dose of NovoLog .  I refilled her DM medications and sensor prescriptions. -  I suggested to:  Patient Instructions  Please change from Levemir  to: - Tresiba  7 units in a.m.  Continue: - Novolog  10-15 minutes before each meal: 2 units before a smaller meal 3 units before a regular meal 4 units before a large meal Bolus 1 unit for coffee.  Please come back for a follow-up appointment in 3-4 months.  - we checked her HbA1c: 6.9% (surprisingly, most likely due to the significant worsening of the blood sugars only in the last 2 weeks) - advised to check sugars at different times of the day - 4x a day, rotating check times - advised for yearly eye exams  >> she is not UTD - return to clinic in 3-4 months  2.  Palpable thyroid  - Her thyroid  is symmetric and nonnodular on palpation - She does not have neck compression symptoms - TSH was normal last month: Lab Results  Component Value Date   TSH 1.47 10/18/2024   Lela Fendt, MD PhD [facility] Endocrinology

## 2024-11-16 ENCOUNTER — Inpatient Hospital Stay: Admission: RE | Admit: 2024-11-16 | Discharge: 2024-11-16 | Attending: Family

## 2024-11-16 ENCOUNTER — Ambulatory Visit: Admitting: Family

## 2024-11-16 ENCOUNTER — Encounter: Payer: Self-pay | Admitting: Family

## 2024-11-16 VITALS — BP 138/82 | HR 78 | Temp 98.6°F | Ht 63.0 in | Wt 105.2 lb

## 2024-11-16 DIAGNOSIS — M7989 Other specified soft tissue disorders: Secondary | ICD-10-CM | POA: Insufficient documentation

## 2024-11-16 DIAGNOSIS — E119 Type 2 diabetes mellitus without complications: Secondary | ICD-10-CM | POA: Insufficient documentation

## 2024-11-16 DIAGNOSIS — R000 Tachycardia, unspecified: Secondary | ICD-10-CM | POA: Diagnosis not present

## 2024-11-16 DIAGNOSIS — R7989 Other specified abnormal findings of blood chemistry: Secondary | ICD-10-CM

## 2024-11-16 DIAGNOSIS — Z0000 Encounter for general adult medical examination without abnormal findings: Secondary | ICD-10-CM

## 2024-11-16 DIAGNOSIS — Z122 Encounter for screening for malignant neoplasm of respiratory organs: Secondary | ICD-10-CM | POA: Diagnosis not present

## 2024-11-16 DIAGNOSIS — I340 Nonrheumatic mitral (valve) insufficiency: Secondary | ICD-10-CM | POA: Diagnosis not present

## 2024-11-16 DIAGNOSIS — I083 Combined rheumatic disorders of mitral, aortic and tricuspid valves: Secondary | ICD-10-CM | POA: Insufficient documentation

## 2024-11-16 DIAGNOSIS — Z1231 Encounter for screening mammogram for malignant neoplasm of breast: Secondary | ICD-10-CM | POA: Diagnosis not present

## 2024-11-16 DIAGNOSIS — I499 Cardiac arrhythmia, unspecified: Secondary | ICD-10-CM | POA: Insufficient documentation

## 2024-11-16 DIAGNOSIS — Z136 Encounter for screening for cardiovascular disorders: Secondary | ICD-10-CM

## 2024-11-16 DIAGNOSIS — Z13220 Encounter for screening for lipoid disorders: Secondary | ICD-10-CM | POA: Diagnosis not present

## 2024-11-16 DIAGNOSIS — F17200 Nicotine dependence, unspecified, uncomplicated: Secondary | ICD-10-CM | POA: Insufficient documentation

## 2024-11-16 LAB — ECHOCARDIOGRAM COMPLETE
AR max vel: 1.89 cm2
AV Area VTI: 2.15 cm2
AV Area mean vel: 1.85 cm2
AV Mean grad: 7 mmHg
AV Peak grad: 12.1 mmHg
Ao pk vel: 1.74 m/s
Area-P 1/2: 5.42 cm2
P 1/2 time: 436 ms
S' Lateral: 3.4 cm

## 2024-11-16 NOTE — Assessment & Plan Note (Signed)
 Collaborated with Dr. Babara today over secure chat.  Suspect likely reactive.  Upcoming his follow-up with Dr. Babara scheduled for later this month.  Advised patient right upper ultrasound, MRI abdomen is not needed at this time. She remains sober.  Will follow.

## 2024-11-16 NOTE — Progress Notes (Unsigned)
 Assessment & Plan:  Annual physical exam -     Hepatitis B surface antibody,quantitative; Future -     Lipid panel; Future  Leg swelling Assessment & Plan: Resolved at this time.  No symptoms of fluid volume overload.  Echocardiogram obtained today (result pending). Strongly encouraged compression stockings.  Provided her with Ace wrap's to start using.  Close follow-up.  Orders: -     Uric acid; Future  Encounter for screening mammogram for malignant neoplasm of breast -     3D Screening Mammogram, Left and Right; Future  Screening for lung cancer -     Ambulatory Referral for Lung Cancer Scre  Encounter for lipid screening for cardiovascular disease -     Lipid panel; Future  Elevated ferritin Assessment & Plan: Collaborated with Dr. Babara today over secure chat.  Suspect likely reactive.  Upcoming his follow-up with Dr. Babara scheduled for later this month.  Advised patient right upper ultrasound, MRI abdomen is not needed at this time. She remains sober.  Will follow.    Routine general medical examination at a health care facility Assessment & Plan: Deferred clinical breast exam, pelvic exam as well as Pap smear due to patient preference she will return for Pap smear.  She will schedule mammogram.  Encouraged exercise.  Referral to annual CT lung cancer screening program      Return precautions given.   Risks, benefits, and alternatives of the medications and treatment plan prescribed today were discussed, and patient expressed understanding.   Education regarding symptom management and diagnosis given to patient on AVS either electronically or printed.  Return in about 6 weeks (around 12/28/2024).  Rollene Northern, FNP  Subjective:    Patient ID: Mary Haney, female    DOB: 17-Feb-1971, 53 y.o.   MRN: [id number]  CC: Mary Haney is a 53 y.o. female who presents today for physical exam.    HPI: HPI Discussed the use of AI scribe software for clinical note  transcription with the patient, who gave verbal consent to proceed.  History of Present Illness   Mary Haney is a 53 year old female who presents for an annual physical exam.  Feels well today.  No new complaints   she has type 1 diabetes and recently switched from Levemir  to Tresiba  after Levemir  was discontinued. Her A1c is currently 6.9, improved from a previous reading of 8.9. The Levemir  was not effective, leading to higher daily insulin  use. She has just started Tresiba  and is monitoring her blood sugar levels closely.  She experiences continued bilateral ankle pain and swelling, particularly at the end of the day.  Bilateral leg swelling improved from prior.  Denies numbness or tingling. She has tried compression stockings but finds them irritating due to dry skin.    She has not been on Paxil   and confirms she does not need it. She feels good mentally and manages stress with support from her boyfriend .  She is not currently engaged in structured exercise but stays active through daily activities and work with [employer].      Consult with Dr. Babara 11/01/2024 for elevated ferritin  Follow-up endocrinology for type 1 diabetes 11/07/2024   Colorectal Cancer Screening: due ; 2019 , repeat in 3 years  Breast Cancer Screening: Mammogram due   Cervical Cancer Screening: 02/08/2021, negative HPV, negative malignancy   08/18/2017 Pap smear negative malignancy, negative HPV   Lung Cancer Screening: Due No family history of AAA.  Tetanus - UTD         Exercise: No regular exercise aside of work  Alcohol use:  none at this time Smoking/tobacco use: smoker.    Health Maintenance  Topic Date Due   Hepatitis B Vaccine (1 of 3 - 19+ 3-dose series) Never done   Pneumococcal Vaccine for age over 32 (2 of 2 - PCV) 09/20/2015   Colon Cancer Screening  02/11/2021   Zoster (Shingles) Vaccine (1 of 2) Never done   Eye exam for diabetics  01/07/2024   DTaP/Tdap/Td vaccine (2 - Tdap)  07/20/2024   COVID-19 Vaccine (3 - 2025-26 season) 08/08/2024   Flu Shot  03/07/2025*   Complete foot exam   03/22/2025   Breast Cancer Screening  03/29/2025   Hemoglobin A1C  05/08/2025   Yearly kidney function blood test for diabetes  10/18/2025   Yearly kidney health urinalysis for diabetes  10/19/2025   Pap with HPV screening  02/08/2026   Hepatitis C Screening  Completed   HPV Vaccine  Aged Out   Meningitis B Vaccine  Aged Out  *Topic was postponed. The date shown is not the original due date.    ALLERGIES: Patient has no known allergies.  Current Outpatient Medications on File Prior to Visit  Medication Sig Dispense Refill   albuterol  (PROAIR  HFA) 108 (90 Base) MCG/ACT inhaler Inhale 2 puffs into the lungs every 6 (six) hours as needed for wheezing or shortness of breath. 1 each 1   BAQSIMI  ONE PACK 3 MG/DOSE POWD PLACE 3 MG INTO THE NOSE ONCE AS NEEDED FOR UP TO 1 DOSE 1 each 11   BD PEN NEEDLE SHORT ULTRAFINE 31G X 8 MM MISC USE FIVE TIMES DAILY 100 each 11   cetirizine (ZYRTEC) 10 MG tablet Take 10 mg by mouth daily as needed for allergies.      Continuous Glucose Sensor (DEXCOM G7 SENSOR) MISC 3 each by Does not apply route every 30 (thirty) days. Apply 1 sensor every 10 days 9 each 4   hydrocortisone  (ANUSOL -HC) 25 MG suppository Place 1 suppository (25 mg total) rectally 2 (two) times daily. 12 suppository 0   insulin  aspart (NOVOLOG  FLEXPEN) 100 UNIT/ML FlexPen Inject 2-9 Units into the skin 3 (three) times daily with meals. 30 mL 3   insulin  degludec (TRESIBA  FLEXTOUCH) 100 UNIT/ML FlexTouch Pen Inject 7 Units into the skin daily. 9 mL 3   Insulin  Pen Needle (B-D UF III MINI PEN NEEDLES) 31G X 5 MM MISC USE 5x WHEN TAKING INSULIN . 300 each 3   Insulin  Syringe-Needle U-100 (BD INSULIN  SYRINGE U/F) 30G X 1/2 0.5 ML MISC USE 2x a day WHEN TAKING INSULIN  200 each 3   triamcinolone  ointment (KENALOG ) 0.1 % APPLY TOPICALLY 2 TIMES DAILY AS NEEDED. LIMIT USE DUE RISK OF SKIN  DISCOLORATION 240 g 1   No current facility-administered medications on file prior to visit.    Review of Systems  Constitutional:  Negative for chills, fever and unexpected weight change.  HENT:  Negative for congestion.   Respiratory:  Negative for cough and shortness of breath.   Cardiovascular:  Positive for leg swelling. Negative for chest pain and palpitations.  Gastrointestinal:  Negative for nausea and vomiting.  Musculoskeletal:  Negative for arthralgias and myalgias.  Skin:  Negative for rash.  Neurological:  Negative for headaches.  Hematological:  Negative for adenopathy.  Psychiatric/Behavioral:  Negative for confusion.       Objective:    BP 138/82   Pulse  78   Temp 98.6 F (37 C) (Oral)   Ht 5' 3 (1.6 m)   Wt 105 lb 3.2 oz (47.7 kg)   LMP  (LMP Unknown)   SpO2 98%   BMI 18.64 kg/m   BP Readings from Last 3 Encounters:  11/16/24 138/82  11/07/24 (!) 142/80  11/01/24 (!) 164/87   Wt Readings from Last 3 Encounters:  11/16/24 105 lb 3.2 oz (47.7 kg)  11/07/24 104 lb 3.2 oz (47.3 kg)  11/01/24 102 lb 6.4 oz (46.4 kg)    Physical Exam Vitals reviewed.  Constitutional:      Appearance: She is well-developed.  Eyes:     Conjunctiva/sclera: Conjunctivae normal.  Neck:     Thyroid : No thyroid  mass or thyromegaly.  Cardiovascular:     Rate and Rhythm: Normal rate and regular rhythm.     Pulses: Normal pulses.     Heart sounds: Normal heart sounds.     Comments: No LE edema, palpable cords or masses. No erythema or increased warmth. No asymmetry in calf size when compared bilaterally LE hair growth symmetric and present. No discoloration or varicosities noted. LE warm  Pulmonary:     Effort: Pulmonary effort is normal.     Breath sounds: Normal breath sounds. No wheezing, rhonchi or rales.  Musculoskeletal:     Right lower leg: No edema.     Left lower leg: No edema.  Lymphadenopathy:     Head:     Right side of head: No submental, submandibular,  tonsillar, preauricular, posterior auricular or occipital adenopathy.     Left side of head: No submental, submandibular, tonsillar, preauricular, posterior auricular or occipital adenopathy.     Cervical: No cervical adenopathy.  Skin:    General: Skin is warm and dry.  Neurological:     Mental Status: She is alert.  Psychiatric:        Speech: Speech normal.        Behavior: Behavior normal.        Thought Content: Thought content normal.

## 2024-11-16 NOTE — Patient Instructions (Addendum)
 Please call  and schedule your 3D mammogram and /or bone density scan as we discussed.   [facility]  ( new location in 2023)  [address], [place], [place] [address]  [place], [place]  [phone]  You are due for annual lung cancer screening program.      I have placed a referral to [facility] pulmonology  and their office will reach out to you to schedule your CT of your chest.  They will reach out to you annually going forward.  If you do not hear from their office in the next 1 to 2 weeks, please call [facility] at [phone]   So let me know if there are any issues in getting scheduled.   Please be very diligent with Ace wrap's on your legs to keep her leg swelling.  Wear during the daytime, daily.  At nighttime you may remove them.   Health Maintenance for Postmenopausal Women Menopause is a normal process in which your ability to get pregnant comes to an end. This process happens slowly over many months or years, usually between the ages of 83 and 45. Menopause is complete when you have missed your menstrual period for 12 months. It is important to talk with your health care provider about some of the most common conditions that affect women after menopause (postmenopausal women). These include heart disease, cancer, and bone loss (osteoporosis). Adopting a healthy lifestyle and getting preventive care can help to promote your health and wellness. The actions you take can also lower your chances of developing some of these common conditions. What are the signs and symptoms of menopause? During menopause, you may have the following symptoms: Hot flashes. These can be moderate or severe. Night sweats. Decrease in sex drive. Mood swings. Headaches. Tiredness (fatigue). Irritability. Memory problems. Problems falling asleep or staying asleep. Talk with your health care provider about treatment options for your symptoms. Do I  need hormone replacement therapy? Hormone replacement therapy is effective in treating symptoms that are caused by menopause, such as hot flashes and night sweats. Hormone replacement carries certain risks, especially as you become older. If you are thinking about using estrogen or estrogen with progestin, discuss the benefits and risks with your health care provider. How can I reduce my risk for heart disease and stroke? The risk of heart disease, heart attack, and stroke increases as you age. One of the causes may be a change in the body's hormones during menopause. This can affect how your body uses dietary fats, triglycerides, and cholesterol. Heart attack and stroke are medical emergencies. There are many things that you can do to help prevent heart disease and stroke. Watch your blood pressure High blood pressure causes heart disease and increases the risk of stroke. This is more likely to develop in people who have high blood pressure readings or are overweight. Have your blood pressure checked: Every 3-5 years if you are 21-66 years of age. Every year if you are 30 years old or older. Eat a healthy diet  Eat a diet that includes plenty of vegetables, fruits, low-fat dairy products, and lean protein. Do not eat a lot of foods that are high in solid fats, added sugars, or sodium. Get regular exercise Get regular exercise. This is one of the most important things you can do for your health. Most adults should: Try to exercise for at least 150 minutes each week. The exercise should increase your heart  rate and make you sweat (moderate-intensity exercise). Try to do strengthening exercises at least twice each week. Do these in addition to the moderate-intensity exercise. Spend less time sitting. Even light physical activity can be beneficial. Other tips Work with your health care provider to achieve or maintain a healthy weight. Do not use any products that contain nicotine  or tobacco. These  products include cigarettes, chewing tobacco, and vaping devices, such as e-cigarettes. If you need help quitting, ask your health care provider. Know your numbers. Ask your health care provider to check your cholesterol and your blood sugar (glucose). Continue to have your blood tested as directed by your health care provider. Do I need screening for cancer? Depending on your health history and family history, you may need to have cancer screenings at different stages of your life. This may include screening for: Breast cancer. Cervical cancer. Lung cancer. Colorectal cancer. What is my risk for osteoporosis? After menopause, you may be at increased risk for osteoporosis. Osteoporosis is a condition in which bone destruction happens more quickly than new bone creation. To help prevent osteoporosis or the bone fractures that can happen because of osteoporosis, you may take the following actions: If you are 83-4 years old, get at least 1,000 mg of calcium  and at least 600 international units (IU) of vitamin D per day. If you are older than age 65 but younger than age 73, get at least 1,200 mg of calcium  and at least 600 international units (IU) of vitamin D per day. If you are older than age 85, get at least 1,200 mg of calcium  and at least 800 international units (IU) of vitamin D per day. Smoking and drinking excessive alcohol increase the risk of osteoporosis. Eat foods that are rich in calcium  and vitamin D, and do weight-bearing exercises several times each week as directed by your health care provider. How does menopause affect my mental health? Depression may occur at any age, but it is more common as you become older. Common symptoms of depression include: Feeling depressed. Changes in sleep patterns. Changes in appetite or eating patterns. Feeling an overall lack of motivation or enjoyment of activities that you previously enjoyed. Frequent crying spells. Talk with your health care  provider if you think that you are experiencing any of these symptoms. General instructions See your health care provider for regular wellness exams and vaccines. This may include: Scheduling regular health, dental, and eye exams. Getting and maintaining your vaccines. These include: Influenza vaccine. Get this vaccine each year before the flu season begins. Pneumonia vaccine. Shingles vaccine. Tetanus, diphtheria, and pertussis (Tdap) booster vaccine. Your health care provider may also recommend other immunizations. Tell your health care provider if you have ever been abused or do not feel safe at home. Summary Menopause is a normal process in which your ability to get pregnant comes to an end. This condition causes hot flashes, night sweats, decreased interest in sex, mood swings, headaches, or lack of sleep. Treatment for this condition may include hormone replacement therapy. Take actions to keep yourself healthy, including exercising regularly, eating a healthy diet, watching your weight, and checking your blood pressure and blood sugar levels. Get screened for cancer and depression. Make sure that you are up to date with all your vaccines. This information is not intended to replace advice given to you by your health care provider. Make sure you discuss any questions you have with your health care provider. Document Revised: 04/15/2021 Document Reviewed: 04/15/2021 Elsevier Patient  Education  2024 [employer].

## 2024-11-16 NOTE — Assessment & Plan Note (Signed)
 Resolved at this time.  No symptoms of fluid volume overload.  Echocardiogram obtained today (result pending). Strongly encouraged compression stockings.  Provided her with Ace wrap's to start using.  Close follow-up.

## 2024-11-17 ENCOUNTER — Other Ambulatory Visit

## 2024-11-18 NOTE — Assessment & Plan Note (Addendum)
 Deferred clinical breast exam, pelvic exam as well as Pap smear due to patient preference she will return for Pap smear.  She will schedule mammogram.  Encouraged exercise.  Referral to annual CT lung cancer screening program

## 2024-11-23 ENCOUNTER — Telehealth: Payer: Self-pay | Admitting: Acute Care

## 2024-11-23 ENCOUNTER — Ambulatory Visit: Payer: Self-pay | Admitting: Family

## 2024-11-23 DIAGNOSIS — F17210 Nicotine dependence, cigarettes, uncomplicated: Secondary | ICD-10-CM

## 2024-11-23 DIAGNOSIS — Z87891 Personal history of nicotine dependence: Secondary | ICD-10-CM

## 2024-11-23 DIAGNOSIS — Z122 Encounter for screening for malignant neoplasm of respiratory organs: Secondary | ICD-10-CM

## 2024-11-23 NOTE — Telephone Encounter (Signed)
 Lung Cancer Screening Narrative/Criteria Questionnaire (Cigarette Smokers Only- No Cigars/Pipes/vapes)   Mary Haney   SDMV:12/06/2024 8:30 [place]       1971-05-20   LDCT: 12/13/2024 8:30a [facility]    53 y.o.   Phone: [phone]  Lung Screening Narrative (confirm age 54-77 yrs Medicare / 50-80 yrs Private pay insurance)   Insurance information:[employer]    Referring Provider: Rollene Northern FNP   This screening involves an initial phone call with a team member from our program. It is called a shared decision making visit. The initial meeting is required by  insurance and Medicare to make sure you understand the program. This appointment takes about 15-20 minutes to complete. You will complete the screening scan at your scheduled date/time.  This scan takes about 5-10 minutes to complete. You can eat and drink normally before and after the scan.  Criteria questions for Lung Cancer Screening:   Are you a current or former smoker? Current Age began smoking: 53yo   If you are a former smoker, what year did you quit smoking? Quit for 2 years then started again (within 15 yrs)   To calculate your smoking history, I need an accurate estimate of how many packs of cigarettes you smoked per day and for how many years. (Not just the number of PPD you are now smoking)   Years smoking 35 x Packs per day 1 = Pack years 35   (at least 20 pack yrs)   (Make sure they understand that we need to know how much they have smoked in the past, not just the number of PPD they are smoking now)  Do you have a personal history of cancer?  No    Do you have a family history of cancer? Yes  (cancer type and and relative) Father - lung   Are you coughing up blood?  No  Have you had unexplained weight loss of 15 lbs or more in the last 6 months? No  It looks like you meet all criteria.  When would be a good time for us  to schedule you for this screening?   Additional information: N/A

## 2024-11-28 ENCOUNTER — Encounter: Payer: Self-pay | Admitting: Family

## 2024-11-29 ENCOUNTER — Telehealth: Payer: Self-pay | Admitting: Oncology

## 2024-11-29 NOTE — Telephone Encounter (Signed)
 MD schedule is overbooked on 12/29. Per MD please move some off to another day.   I called and spoke with pt and she was okay with moving to the next day in the afternoon. New date/time confirmed with pt

## 2024-12-05 ENCOUNTER — Inpatient Hospital Stay: Admitting: Oncology

## 2024-12-05 ENCOUNTER — Ambulatory Visit: Payer: Self-pay

## 2024-12-05 NOTE — Telephone Encounter (Signed)
 FYI Only or Action Required?: FYI only for provider: appointment scheduled on Jan 2.  Patient was last seen in primary care on 11/16/2024 by Dineen Rollene MATSU, FNP.  Called Nurse Triage reporting Leg Swelling.  Symptoms began 1 1/2 months.  Interventions attempted: Rest, hydration, or home remedies.  Symptoms are: gradually worsening.  Triage Disposition: See PCP Within 2 Weeks  Patient/caregiver understands and will follow disposition?: Yes  Reason for Disposition  [1] MILD swelling of both ankles (i.e., pedal edema) AND [2] is a chronic symptom (recurrent or ongoing AND present > 4 weeks)  Answer Assessment - Initial Assessment Questions Limited triage as pt is at work. Denies SOB or CP. Scheduled appt, pt unavailable until Jan 2. ED precautions reviewed, pt verbalized understanding.  1. ONSET: When did the swelling start? (e.g., minutes, hours, days)     1 1/2 months 2. LOCATION: What part of the leg is swollen?  Are both legs swollen or just one leg?     Bilateral lower leg ankle, foot and toe a 5. PAIN: Is the swelling painful to touch?     Yes 6. FEVER: Do you have a fever? If Yes, ask: What is it, how was it measured, and when did it start?      Denies 7. CAUSE: What do you think is causing the leg swelling?     Unknown 9. RECURRENT SYMPTOM: Have you had leg swelling before? If Yes, ask: When was the last time? What happened that time?     Denies 10. OTHER SYMPTOMS: Do you have any other symptoms? (e.g., chest pain, difficulty breathing)       Denies  Protocols used: Leg Swelling and Edema-A-AH Copied from CRM #[id number]. Topic: Clinical - Red Word Triage >> Dec 05, 2024  8:48 AM Chasity T wrote: [facility] that prompted transfer to Nurse Triage: patient is having swellings in both legs,angles, toes and feet. She sates she is in extreme pain as well.she is still able to walk but its a lot of pain to do so. Pt did contact office and office stated if if  its not getting any better to call and make appt.  Patient is unable to hold due to having to work. Please call back [phone] Past Medical History:  Diagnosis Date   Alcoholic pancreatitis    Asthma    Has albuterol  inhaler   Diabetes mellitus without complication (HCC)    Type 2 diagnosed 2013

## 2024-12-05 NOTE — Telephone Encounter (Signed)
 Called Patient and she is aware if the swellling worsens, develops heat in the leg, fever or shortness of breath to go to [facility] immediately to be evaluated. Patient is agreeable to this and to keep the appointments with Dr. Babara and Dr. Marylynn.

## 2024-12-06 ENCOUNTER — Ambulatory Visit: Admitting: Acute Care

## 2024-12-06 ENCOUNTER — Encounter: Payer: Self-pay | Admitting: Oncology

## 2024-12-06 ENCOUNTER — Inpatient Hospital Stay: Attending: Oncology | Admitting: Oncology

## 2024-12-06 VITALS — BP 172/97 | HR 95 | Temp 97.1°F | Ht 63.0 in | Wt 107.0 lb

## 2024-12-06 DIAGNOSIS — R7989 Other specified abnormal findings of blood chemistry: Secondary | ICD-10-CM | POA: Insufficient documentation

## 2024-12-06 DIAGNOSIS — F17210 Nicotine dependence, cigarettes, uncomplicated: Secondary | ICD-10-CM | POA: Diagnosis not present

## 2024-12-06 DIAGNOSIS — Z720 Tobacco use: Secondary | ICD-10-CM | POA: Diagnosis not present

## 2024-12-06 DIAGNOSIS — Z122 Encounter for screening for malignant neoplasm of respiratory organs: Secondary | ICD-10-CM

## 2024-12-06 DIAGNOSIS — K760 Fatty (change of) liver, not elsewhere classified: Secondary | ICD-10-CM | POA: Insufficient documentation

## 2024-12-06 DIAGNOSIS — Z87898 Personal history of other specified conditions: Secondary | ICD-10-CM | POA: Diagnosis not present

## 2024-12-06 NOTE — Assessment & Plan Note (Addendum)
 Recommend smoke cessation.  Is scheduled for lung cancer screening CT in early January.

## 2024-12-06 NOTE — Progress Notes (Signed)
 " Hematology/Oncology Consult note [phone] [phone]        REFERRING PROVIDER: Dineen Rollene MATSU, FNP   CHIEF COMPLAINTS/REASON FOR VISIT:  Evaluation of elevated ferritin   ASSESSMENT & PLAN:   Elevated ferritin Likely reactive to chronic inflammation, fatty liver disease. She does not have hemochromatosis mutation, no history of prior blood transfusion. I will hold off phlebotomy.  Recommend observation. Avoid alcohol, iron supplementation.   Hepatic steatosis Likely fatty liver disease due to alcohol use.  She has stopped alcohol.  Referred to GI  Tobacco use Recommend smoke cessation.  Is scheduled for lung cancer screening CT in early January.    Orders Placed This Encounter  Procedures   CBC with Differential (Cancer Center Only)    Standing Status:   Future    Expected Date:   03/06/2025    Expiration Date:   06/04/2025   Iron and TIBC    Standing Status:   Future    Expected Date:   03/06/2025    Expiration Date:   06/04/2025   Ferritin    Standing Status:   Future    Expected Date:   03/06/2025    Expiration Date:   06/04/2025   Hepatic function panel    Standing Status:   Future    Expected Date:   03/06/2025    Expiration Date:   06/04/2025   Ambulatory referral to Gastroenterology    Referral Priority:   Routine    Referral Type:   Consultation    Referral Reason:   Specialty Services Required    Referred to Provider:   Melany Clotilda HERO, MD    Number of Visits Requested:   1   Follow-up in 3 months All questions were answered. The patient knows to call the clinic with any problems, questions or concerns.  Zelphia Cap, MD, PhD [facility] Hematology Oncology 12/06/2024   HISTORY OF PRESENTING ILLNESS:   Mary Haney is a  53 y.o.  female with PMH listed below was seen in consultation at the request of  Dineen Rollene MATSU, FNP  for evaluation of elevated ferritin.  Discussed the use of AI scribe software for clinical  note transcription with the patient, who gave verbal consent to proceed.   he has been experiencing significant pain in her feet, ankles, and calves for the past four weeks, severely impacting her ability to walk. No stomach pain, unintentional weight loss, or night sweats, although she notes difficulty gaining weight and is experiencing hot flashes associated with premenopause.  Recent blood work showed low blood counts and high iron levels. There is a history of alcohol use, which she quit approximately four months ago. She smokes a pack of cigarettes daily for over 30 years and consumes herbal tea regularly. She regularly consumes herbal tea.   INTERVAL HISTORY Mary Haney is a 53 y.o. female who has above history reviewed by me today presents for follow up visit for elevated ferritin.  Patient presents to discuss results done after last visit.  No new complaints.  She has stopped drinking alcohol.  MEDICAL HISTORY:  Past Medical History:  Diagnosis Date   Alcoholic pancreatitis    Asthma    Has albuterol  inhaler   Diabetes mellitus without complication (HCC)    Type 2 diagnosed 2013    SURGICAL HISTORY: Past Surgical History:  Procedure Laterality Date   COLONOSCOPY WITH PROPOFOL  N/A 02/11/2018   Procedure: COLONOSCOPY WITH PROPOFOL ;  Surgeon: Unk Corinn Skiff, MD;  Location: [facility]  ENDOSCOPY;  Service: Gastroenterology;  Laterality: N/A;   NO PAST SURGERIES      SOCIAL HISTORY: Social History   Socioeconomic History   Marital status: Single    Spouse name: Not on file   Number of children: 0   Years of education: 14   Highest education level: Not on file  Occupational History   Occupation: [employer]er: [employer]    Comment: [employer]  Tobacco Use   Smoking status: Every Day    Current packs/day: 0.50    Average packs/day: 0.5 packs/day for 25.0 years (12.5 ttl pk-yrs)    Types: Cigarettes   Smokeless tobacco: Never   Tobacco comments:     Has Chantix   Substance and Sexual Activity   Alcohol use: Not Currently    Alcohol/week: 3.0 standard drinks of alcohol    Types: 3 Cans of beer per week   Drug use: No   Sexual activity: Yes    Birth control/protection: Condom  Other Topics Concern   Not on file  Social History Narrative   Lauriana was born in [place], [place]. She moved with her family to [place]  at age 64. She attended 2 years at [employer] and was majoring in Asian Studies. Her goal is to become a nurse, learning disability in the [employer]. Adreana lives with her boyfriend, Kip Foushee. They are considering getting married soon. She enjoys sketching outdoor scenery, cartoons and loves to read.      Previously worked at [employer] 09/2023   Social Drivers of Health   Tobacco Use: High Risk (12/06/2024)   Patient History    Smoking Tobacco Use: Every Day    Smokeless Tobacco Use: Never    Passive Exposure: Not on file  Financial Resource Strain: Not on file  Food Insecurity: Not on file  Transportation Needs: Not on file  Physical Activity: Not on file  Stress: Not on file  Social Connections: Not on file  Intimate Partner Violence: Not At Risk (11/05/2023)   Received from [facility]   HITS    Over the last 12 months how often did your partner physically hurt you?: Never    Over the last 12 months how often did your partner insult you or talk down to you?: Never    Over the last 12 months how often did your partner threaten you with physical harm?: Never    Over the last 12 months how often did your partner scream or curse at you?: Never  Depression (PHQ2-9): Low Risk (12/06/2024)   Depression (PHQ2-9)    PHQ-2 Score: 4  Alcohol Screen: Not on file  Housing: Not on file  Utilities: Not on file  Health Literacy: Not on file    FAMILY HISTORY: Family History  Problem Relation Age of Onset   Diabetes Paternal Grandmother    Depression Paternal Grandmother    Diabetes Paternal Grandfather    Depression  Paternal Grandfather    Diabetes Paternal Aunt    Diabetes Paternal Uncle    Diabetes Cousin    Depression Cousin    Breast cancer Neg Hx    Arthritis/Rheumatoid Neg Hx    AAA (abdominal aortic aneurysm) Neg Hx     ALLERGIES:  has no known allergies.  MEDICATIONS:  Current Outpatient Medications  Medication Sig Dispense Refill   BAQSIMI  ONE PACK 3 MG/DOSE POWD PLACE 3 MG INTO THE NOSE ONCE AS NEEDED FOR UP TO 1 DOSE 1 each 11   BD PEN  NEEDLE SHORT ULTRAFINE 31G X 8 MM MISC USE FIVE TIMES DAILY 100 each 11   cetirizine (ZYRTEC) 10 MG tablet Take 10 mg by mouth daily as needed for allergies.      Continuous Glucose Sensor (DEXCOM G7 SENSOR) MISC 3 each by Does not apply route every 30 (thirty) days. Apply 1 sensor every 10 days 9 each 4   hydrocortisone  (ANUSOL -HC) 25 MG suppository Place 1 suppository (25 mg total) rectally 2 (two) times daily. 12 suppository 0   insulin  aspart (NOVOLOG  FLEXPEN) 100 UNIT/ML FlexPen Inject 2-9 Units into the skin 3 (three) times daily with meals. 30 mL 3   insulin  degludec (TRESIBA  FLEXTOUCH) 100 UNIT/ML FlexTouch Pen Inject 7 Units into the skin daily. 9 mL 3   Insulin  Pen Needle (B-D UF III MINI PEN NEEDLES) 31G X 5 MM MISC USE 5x WHEN TAKING INSULIN . 300 each 3   Insulin  Syringe-Needle U-100 (BD INSULIN  SYRINGE U/F) 30G X 1/2 0.5 ML MISC USE 2x a day WHEN TAKING INSULIN  200 each 3   triamcinolone  ointment (KENALOG ) 0.1 % APPLY TOPICALLY 2 TIMES DAILY AS NEEDED. LIMIT USE DUE RISK OF SKIN DISCOLORATION 240 g 1   albuterol  (PROAIR  HFA) 108 (90 Base) MCG/ACT inhaler Inhale 2 puffs into the lungs every 6 (six) hours as needed for wheezing or shortness of breath. (Patient not taking: Reported on 12/06/2024) 1 each 1   No current facility-administered medications for this visit.    Review of Systems  Constitutional:  Negative for appetite change, chills, fatigue and fever.  HENT:   Negative for hearing loss and voice change.   Eyes:  Negative for eye  problems.  Respiratory:  Negative for chest tightness and cough.   Cardiovascular:  Negative for chest pain.  Gastrointestinal:  Negative for abdominal distention, abdominal pain and blood in stool.  Endocrine: Negative for hot flashes.  Genitourinary:  Negative for difficulty urinating and frequency.   Musculoskeletal:  Positive for arthralgias.  Skin:  Negative for itching and rash.  Neurological:  Negative for extremity weakness.  Hematological:  Negative for adenopathy.  Psychiatric/Behavioral:  Negative for confusion.    PHYSICAL EXAMINATION:  Vitals:   12/06/24 1504 12/06/24 1514  BP: (!) 174/98 (!) 172/97  Pulse: 95   Temp: (!) 97.1 F (36.2 C)   SpO2: 99%    Filed Weights   12/06/24 1504  Weight: 107 lb (48.5 kg)    Physical Exam Constitutional:      General: She is not in acute distress. HENT:     Head: Normocephalic and atraumatic.  Eyes:     General: No scleral icterus. Cardiovascular:     Rate and Rhythm: Normal rate and regular rhythm.  Pulmonary:     Effort: Pulmonary effort is normal. No respiratory distress.     Breath sounds: Normal breath sounds. No wheezing.  Abdominal:     General: Bowel sounds are normal. There is no distension.     Palpations: Abdomen is soft.  Musculoskeletal:        General: No deformity. Normal range of motion.     Cervical back: Normal range of motion and neck supple.  Skin:    General: Skin is warm and dry.     Findings: No erythema or rash.  Neurological:     Mental Status: She is alert and oriented to person, place, and time. Mental status is at baseline.  Psychiatric:        Mood and Affect: Mood normal.  LABORATORY DATA:  I have reviewed the data as listed    Latest Ref Rng & Units 11/01/2024    3:42 PM 10/18/2024    8:35 AM 03/24/2024   10:32 AM  CBC  WBC 4.0 - 10.5 K/uL 4.6  5.6  3.3   Hemoglobin 12.0 - 15.0 g/dL 89.4  89.1  87.4   Hematocrit 36.0 - 46.0 % 31.5  32.1  37.6   Platelets 150 - 400 K/uL  318  453.0  304.0       Latest Ref Rng & Units 10/19/2024    3:45 PM 10/18/2024    8:35 AM 03/24/2024   10:32 AM  CMP  Glucose 70 - 99 mg/dL  891  781   BUN 6 - 23 mg/dL  3  3   Creatinine 9.59 - 1.20 mg/dL  9.54  9.41   Sodium 864 - 145 mEq/L  138  134   Potassium 3.5 - 5.1 mEq/L  4.4  4.2   Chloride 96 - 112 mEq/L  102  94   CO2 19 - 32 mEq/L  29  29   Calcium  8.4 - 10.5 mg/dL  8.7  9.1   Total Protein 6.1 - 8.1 g/dL 6.3  6.7  7.4   Total Bilirubin 0.2 - 1.2 mg/dL 1.3  1.5  0.9   Alkaline Phos 39 - 117 U/L  89  153   AST 10 - 35 U/L 28  33  398   ALT 6 - 29 U/L 12  14  107       RADIOGRAPHIC STUDIES: I have personally reviewed the radiological images as listed and agreed with the findings in the report. ECHOCARDIOGRAM COMPLETE Result Date: 11/16/2024    ECHOCARDIOGRAM REPORT   Patient Name:   ZYASIA HALBLEIB Date of Exam: 11/16/2024 Medical Rec #:  [id number]       Height:       63.0 in Accession #:    [id number]      Weight:       104.2 lb Date of Birth:  25-Jun-1971       BSA:          1.466 m Patient Age:    53 years        BP:           142/80 mmHg Patient Gender: F               HR:           98 bpm. Exam Location:  [facility] Procedure: 2D Echo, Cardiac Doppler and Color Doppler (Both Spectral and Color            Flow Doppler were utilized during procedure). Indications:     R00.0 Tachycardia  History:         Patient has no prior history of Echocardiogram examinations.                  Arrythmias:Tachycardia, Signs/Symptoms:Edema; Risk                  Factors:Current Smoker and Diabetes.  Sonographer:     Marshall Benders Referring Phys:  [id number] MARGARET G ARNETT Diagnosing Phys: Evalene Lunger MD IMPRESSIONS  1. Left ventricular ejection fraction, by estimation, is 55 to 60%. The left ventricle has normal function. The left ventricle has no regional wall motion abnormalities. Left ventricular diastolic parameters are indeterminate.  2. Right ventricular systolic function is normal. The  right ventricular size is normal. There  is normal pulmonary artery systolic pressure. The estimated right ventricular systolic pressure is 22.5 mmHg.  3. The mitral valve is normal in structure. Mild mitral valve regurgitation. No evidence of mitral stenosis.  4. The aortic valve is normal in structure. Aortic valve regurgitation is mild. No aortic stenosis is present.  5. The inferior vena cava is normal in size with greater than 50% respiratory variability, suggesting right atrial pressure of 3 mmHg. FINDINGS  Left Ventricle: Left ventricular ejection fraction, by estimation, is 55 to 60%. The left ventricle has normal function. The left ventricle has no regional wall motion abnormalities. Strain was performed and the global longitudinal strain is indeterminate. The left ventricular internal cavity size was normal in size. There is no left ventricular hypertrophy. Left ventricular diastolic parameters are indeterminate. Right Ventricle: The right ventricular size is normal. No increase in right ventricular wall thickness. Right ventricular systolic function is normal. There is normal pulmonary artery systolic pressure. The tricuspid regurgitant velocity is 2.09 m/s, and  with an assumed right atrial pressure of 5 mmHg, the estimated right ventricular systolic pressure is 22.5 mmHg. Left Atrium: Left atrial size was normal in size. Right Atrium: Right atrial size was normal in size. Pericardium: There is no evidence of pericardial effusion. Mitral Valve: The mitral valve is normal in structure. Mild mitral valve regurgitation. No evidence of mitral valve stenosis. Tricuspid Valve: The tricuspid valve is normal in structure. Tricuspid valve regurgitation is mild . No evidence of tricuspid stenosis. Aortic Valve: The aortic valve is normal in structure. Aortic valve regurgitation is mild. Aortic regurgitation PHT measures 436 msec. No aortic stenosis is present. Aortic valve mean gradient measures 7.0 mmHg. Aortic  valve peak gradient measures 12.1 mmHg. Aortic valve area, by VTI measures 2.15 cm. Pulmonic Valve: The pulmonic valve was normal in structure. Pulmonic valve regurgitation is not visualized. No evidence of pulmonic stenosis. Aorta: The aortic root is normal in size and structure. Venous: The inferior vena cava is normal in size with greater than 50% respiratory variability, suggesting right atrial pressure of 3 mmHg. IAS/Shunts: No atrial level shunt detected by color flow Doppler. Additional Comments: 3D was performed not requiring image post processing on an independent workstation and was indeterminate.  LEFT VENTRICLE PLAX 2D LVIDd:         4.90 cm   Diastology LVIDs:         3.40 cm   LV e' lateral:   8.92 cm/s LV PW:         0.90 cm   LV E/e' lateral: 7.5 LV IVS:        0.80 cm LVOT diam:     1.90 cm LV SV:         62 LV SV Index:   42 LVOT Area:     2.84 cm  RIGHT VENTRICLE             IVC RV Basal diam:  2.80 cm     IVC diam: 1.30 cm RV Mid diam:    2.00 cm RV S prime:     11.60 cm/s TAPSE (M-mode): 2.8 cm LEFT ATRIUM             Index        RIGHT ATRIUM           Index LA diam:        2.60 cm 1.77 cm/m   RA Area:     14.50 cm LA Vol (A2C):   59.8 ml  40.80 ml/m  RA Volume:   39.50 ml  26.95 ml/m LA Vol (A4C):   51.6 ml 35.21 ml/m LA Biplane Vol: 58.8 ml 40.12 ml/m  AORTIC VALVE AV Area (Vmax):    1.89 cm AV Area (Vmean):   1.85 cm AV Area (VTI):     2.15 cm AV Vmax:           174.00 cm/s AV Vmean:          119.000 cm/s AV VTI:            0.289 m AV Peak Grad:      12.1 mmHg AV Mean Grad:      7.0 mmHg LVOT Vmax:         116.00 cm/s LVOT Vmean:        77.700 cm/s LVOT VTI:          0.219 m LVOT/AV VTI ratio: 0.76 AI PHT:            436 msec  AORTA Ao Root diam: 3.30 cm Ao Asc diam:  3.50 cm MITRAL VALVE                TRICUSPID VALVE MV Area (PHT): 5.42 cm     TR Peak grad:   17.5 mmHg MV Decel Time: 140 msec     TR Vmax:        209.00 cm/s MV E velocity: 67.30 cm/s MV A velocity: 110.00 cm/s   SHUNTS MV E/A ratio:  0.61         Systemic VTI:  0.22 m                             Systemic Diam: 1.90 cm Evalene Lunger MD Electronically signed by Evalene Lunger MD Signature Date/Time: 11/16/2024/2:10:49 PM    Final    US  Venous Img Lower Bilateral (DVT) Result Date: 10/18/2024 CLINICAL DATA:  BILATERAL lower extremity pain and edema for 2 weeks. EXAM: Bilateral Lower Extremity Venous Doppler Ultrasound TECHNIQUE: Gray-scale sonography with compression, as well as color and duplex ultrasound, were performed to evaluate the deep venous system(s) from the level of the common femoral vein through the popliteal and proximal calf veins. COMPARISON:  None available FINDINGS: VENOUS Normal compressibility of the common femoral, superficial femoral, and popliteal veins, as well as the visualized calf veins. Visualized portions of profunda femoral vein and great saphenous vein unremarkable. No filling defects to suggest DVT on grayscale or color Doppler imaging. Doppler waveforms show normal direction of venous flow, normal respiratory plasticity and response to augmentation. OTHER BILATERAL nonenlarged inguinal lymph nodes are seen. Limitations: none IMPRESSION: No lower extremity DVT. Electronically Signed   By: Aliene Lloyd M.D.   On: 10/18/2024 10:21         "

## 2024-12-06 NOTE — Patient Instructions (Addendum)
 Thank you for participating in the [place] [facility]. It was our pleasure to meet you today. We will call you with the results of your scan within the next few days. Your scan will be assigned a Lung RADS category score by the physicians reading the scans.  This Lung RADS score determines follow up scanning.  See below for description of categories, and follow up screening recommendations. We will be in touch to schedule your follow up screening annually or based on recommendations of our providers. We will fax a copy of your scan results to your Primary Care Physician, or the physician who referred you to the program, to ensure they have the results. Please call the office if you have any questions or concerns regarding your scanning experience or results.  Our office number is [phone]. Please speak with Mary Curly, RN., Mary Doom RN, or [facility] RN, and Mary Dover RN. They are  our Lung Cancer Screening RN.'s If They are unavailable when you call, Please leave a message on the voice mail. We will return your call at our earliest convenience.This voice mail is monitored several times a day.  Remember, if your scan is normal, we will scan you annually as long as you continue to meet the criteria for the program. (Age 52-51, Current smoker or smoker who has quit within the last 15 years). If you are a smoker, remember, quitting is the single most powerful action that you can take to decrease your risk of lung cancer and other pulmonary, breathing related problems. We know quitting is hard, and we are here to help.  Please let us  know if there is anything we can do to help you meet your goal of quitting. If you are a former smoker, counselling psychologist. We are proud of you! Remain smoke free! Remember you can refer friends or family members through the number above.  We will screen them to make sure they meet criteria for the program. Thank you for helping us   take better care of you by participating in Lung Screening.  For Virtual Smoking Cessation Classes , The American Lung Association Provides  Freedom From Smoking Classes.  Please search their website for dates and times.    Lung RADS Categories:  Lung RADS 1: no nodules or definitely non-concerning nodules.  Recommendation is for a repeat annual scan in 12 months.  Lung RADS 2:  nodules that are non-concerning in appearance and behavior with a very low likelihood of becoming an active cancer. Recommendation is for a repeat annual scan in 12 months.  Lung RADS 3: nodules that are probably non-concerning , includes nodules with a low likelihood of becoming an active cancer.  Recommendation is for a 34-month repeat screening scan. Often noted after an upper respiratory illness. We will be in touch to make sure you have no questions, and to schedule your 52-month scan.  Lung RADS 4 A: nodules with concerning findings, recommendation is most often for a follow up scan in 3 months or additional testing based on our provider's assessment of the scan. We will be in touch to make sure you have no questions and to schedule the recommended 3 month follow up scan.  Lung RADS 4 B:  indicates findings that are concerning. We will be in touch with you to schedule additional diagnostic testing based on our provider's  assessment of the scan.  Smoking Cessation  Tips to Quit:  Pick a Quit Day within the next week.  Remove temptations: toss cigarettes, lighters, ashtrays.  Tell someone you trust for support.  Avoid triggers like stress, boredom, or being around smokers.  Use healthy replacements: water, gum, walking, deep breaths.  Stay busy with hobbies, music, drawing, or exercise.  Be patient with yourself--slipping once doesnt mean failure.   You can receive free nicotine  replacement therapy (patches, gum, or mints) by calling 1-800-QUIT NOW. Please call so we can get you on the path to becoming a  non-smoker. I know it is hard, but you can do this!  The American Lung Association offers Freedom From Smoking Programs Self guided or group programs offered, check their website for free virtual programs available to [facility] residents Lung Helpline at 1-800-LUNGUSA  http://keith.biz/  Hypnosis for smoking cessation  [employer] 305-875-0140  Acupuncture for smoking cessation  [employer] (251) 644-1376   [employer] Offers tools and tips to quit smoking.  Free quitSTART app:   Monitor progress, manage cravings, access tools, and more with the app.  portablegrid.se   Freedom From Smoking  Virtual Group, FREE to [place] residents  (Class sizes are capped at 16 people) January 7th-February18th Wednesdays 6:15 pm- 7:45 pm  poodlehair.com.ee Contact:   Kimetha Fulwood   [email or web address]  [phone]   Your CT scan is scheduled for 12/13/2024 at 8:30 am at [facility].

## 2024-12-06 NOTE — Progress Notes (Signed)
 Virtual Visit via Telephone Note  I connected with Mary Haney on 12/06/2024 at  8:30 AM EST by telephone and verified that I am speaking with the correct person using two identifiers.  Location: Patient: At home, in [place]  Provider: [address] [place], [place], [address]    I discussed the limitations, risks, security and privacy concerns of performing an evaluation and management service by telephone and the availability of in person appointments. I also discussed with the patient that there may be a patient responsible charge related to this service. The patient expressed understanding and agreed to proceed.  Shared Decision Making Visit Lung Cancer Screening Program [phone])   Eligibility: Age 41 y.o. Pack Years Smoking History Calculation 35 pack years  (# packs/per year x # years smoked) Recent History of coughing up blood  no Unexplained weight loss? no ( >Than 15 pounds within the last 6 months ) Prior History Lung / other cancer no (Diagnosis within the last 5 years already requiring surveillance chest CT Scans). Smoking Status Current Smoker Former Smokers: Years since quit: N/A  Quit Date: N/A  Visit Components: Discussion included one or more decision making aids. yes Discussion included risk/benefits of screening. yes Discussion included potential follow up diagnostic testing for abnormal scans. yes Discussion included meaning and risk of over diagnosis. yes Discussion included meaning and risk of False Positives. yes Discussion included meaning of total radiation exposure. yes  Counseling Included: Importance of adherence to annual lung cancer LDCT screening. yes Impact of comorbidities on ability to participate in the program. yes Ability and willingness to under diagnostic treatment. yes  Smoking Cessation Counseling: Current Smokers:  Discussed importance of smoking cessation. yes Information about tobacco cessation classes and interventions  provided to patient. yes Patient provided with ticket for LDCT Scan. N/A Symptomatic Patient. yes  Counseling(Intermediate counseling: > three minutes) [address]406 Diagnosis Code: Tobacco Use Z72.0 Asymptomatic Patient no  Counseling  Former Smokers:  Discussed the importance of maintaining cigarette abstinence. N/A Diagnosis Code: Personal History of Nicotine  Dependence. S12.108 Information about tobacco cessation classes and interventions provided to patient. Yes Patient provided with ticket for LDCT Scan. N/A Written Order for Lung Cancer Screening with LDCT placed in Epic. Yes (CT Chest Lung Cancer Screening Low Dose W/O CM) [id number] Z12.2-Screening of respiratory organs Z87.891-Personal history of nicotine  dependence  [name] is a current user of tobacco or nicotine  products. She is considering quitting at this time. Counseling provided today addressed the risks of continued use and the benefits of cessation. Discussed tobacco/nicotine  use history, readiness to quit, and evidence-based treatment options including behavioral strategies, support resources, and pharmacologic therapies. Provided encouragement and educational materials on steps and resources to quit smoking. Patient questions were addressed, and follow-up recommended for continued support. Total time spent on counseling: 4 minutes.   Shared decision visit completed by Wells Georgia, FNP as a registered nurse awaiting credentialing.    Wells CHRISTELLA Georgia, FNP

## 2024-12-06 NOTE — Assessment & Plan Note (Signed)
 Likely reactive to chronic inflammation, fatty liver disease. She does not have hemochromatosis mutation, no history of prior blood transfusion. I will hold off phlebotomy.  Recommend observation. Avoid alcohol, iron supplementation.

## 2024-12-06 NOTE — Assessment & Plan Note (Signed)
 Likely fatty liver disease due to alcohol use.  She has stopped alcohol.  Referred to GI

## 2024-12-08 ENCOUNTER — Encounter: Payer: Self-pay | Admitting: Internal Medicine

## 2024-12-09 ENCOUNTER — Encounter: Payer: Self-pay | Admitting: Internal Medicine

## 2024-12-09 ENCOUNTER — Ambulatory Visit (INDEPENDENT_AMBULATORY_CARE_PROVIDER_SITE_OTHER): Admitting: Internal Medicine

## 2024-12-09 VITALS — BP 148/82 | HR 106 | Ht 63.0 in | Wt 108.8 lb

## 2024-12-09 DIAGNOSIS — K648 Other hemorrhoids: Secondary | ICD-10-CM

## 2024-12-09 DIAGNOSIS — R7989 Other specified abnormal findings of blood chemistry: Secondary | ICD-10-CM

## 2024-12-09 DIAGNOSIS — R600 Localized edema: Secondary | ICD-10-CM | POA: Diagnosis not present

## 2024-12-09 DIAGNOSIS — K700 Alcoholic fatty liver: Secondary | ICD-10-CM

## 2024-12-09 DIAGNOSIS — R000 Tachycardia, unspecified: Secondary | ICD-10-CM

## 2024-12-09 MED ORDER — METOPROLOL SUCCINATE ER 25 MG PO TB24: 25.0000 mg | ORAL_TABLET | Freq: Every day | ORAL | 3 refills | Status: AC

## 2024-12-09 MED ORDER — SPIRONOLACTONE 50 MG PO TABS: 50.0000 mg | ORAL_TABLET | ORAL | 1 refills | Status: AC

## 2024-12-09 NOTE — Progress Notes (Signed)
 "  Subjective:  Patient ID: Mary Haney, female    DOB: [birth year]  Age: 54 y.o. MRN: [id number]  CC: The primary encounter diagnosis was Alcohol induced fatty liver. Diagnoses of Internal hemorrhoids, Elevated ferritin, Bilateral lower extremity edema, and Tachycardia were also pertinent to this visit.   HPI Mary Haney presents for  Chief Complaint  Patient presents with   Foot Swelling    Bilateral foot and ankle swelling x 2 months   54 yr old female with type 1 DM with proteinuria, history of alcohol abuse (quit 4 months ago) , fatty liver with elevated iron,  and chronic  tobacco abuse presents with  recurrent LE edema for the past 2 months .    Denies orthopnea, weight gain,  recet trauma.     She has an ECHO done Dec 10 for evaluation of edema and tachycardia: normal EF,  mild MR and AR  ? Diastolic dysfunction   Alcohol abuse:  has been abstaining from alcohol for over 4 months   Outpatient Medications Prior to Visit  Medication Sig Dispense Refill   albuterol  (PROAIR  HFA) 108 (90 Base) MCG/ACT inhaler Inhale 2 puffs into the lungs every 6 (six) hours as needed for wheezing or shortness of breath. 1 each 1   BAQSIMI  ONE PACK 3 MG/DOSE POWD PLACE 3 MG INTO THE NOSE ONCE AS NEEDED FOR UP TO 1 DOSE 1 each 11   BD PEN NEEDLE SHORT ULTRAFINE 31G X 8 MM MISC USE FIVE TIMES DAILY 100 each 11   cetirizine (ZYRTEC) 10 MG tablet Take 10 mg by mouth daily as needed for allergies.      Continuous Glucose Sensor (DEXCOM G7 SENSOR) MISC 3 each by Does not apply route every 30 (thirty) days. Apply 1 sensor every 10 days 9 each 4   hydrocortisone  (ANUSOL -HC) 25 MG suppository Place 1 suppository (25 mg total) rectally 2 (two) times daily. 12 suppository 0   insulin  aspart (NOVOLOG  FLEXPEN) 100 UNIT/ML FlexPen Inject 2-9 Units into the skin 3 (three) times daily with meals. 30 mL 3   insulin  degludec (TRESIBA  FLEXTOUCH) 100 UNIT/ML FlexTouch Pen Inject 7 Units into the skin daily. 9 mL 3    Insulin  Pen Needle (B-D UF III MINI PEN NEEDLES) 31G X 5 MM MISC USE 5x WHEN TAKING INSULIN . 300 each 3   Insulin  Syringe-Needle U-100 (BD INSULIN  SYRINGE U/F) 30G X 1/2 0.5 ML MISC USE 2x a day WHEN TAKING INSULIN  200 each 3   triamcinolone  ointment (KENALOG ) 0.1 % APPLY TOPICALLY 2 TIMES DAILY AS NEEDED. LIMIT USE DUE RISK OF SKIN DISCOLORATION 240 g 1   No facility-administered medications prior to visit.    Review of Systems;  Patient denies headache, fevers, malaise, unintentional weight loss, skin rash, eye pain, sinus congestion and sinus pain, sore throat, dysphagia,  hemoptysis , cough, dyspnea, wheezing, chest pain, palpitations, orthopnea,  abdominal pain, nausea, melena, diarrhea, constipation, flank pain, dysuria, hematuria, urinary  Frequency, nocturia, numbness, tingling, seizures,  Focal weakness, Loss of consciousness,  Tremor, insomnia, depression, anxiety, and suicidal ideation.      Objective:  BP (!) 148/82   Pulse (!) 106   Ht 5' 3 (1.6 m)   Wt 108 lb 12.8 oz (49.4 kg)   LMP  (LMP Unknown)   SpO2 99%   BMI 19.27 kg/m   BP Readings from Last 3 Encounters:  12/09/24 (!) 148/82  12/06/24 (!) 172/97  11/16/24 138/82    Wt Readings from Last 3 Encounters:  12/09/24 108 lb 12.8 oz (49.4 kg)  12/06/24 107 lb (48.5 kg)  11/16/24 105 lb 3.2 oz (47.7 kg)    Physical Exam Vitals reviewed.  Constitutional:      General: She is not in acute distress.    Appearance: Normal appearance. She is normal weight. She is not ill-appearing, toxic-appearing or diaphoretic.  HENT:     Head: Normocephalic.  Eyes:     General: No scleral icterus.       Right eye: No discharge.        Left eye: No discharge.     Conjunctiva/sclera: Conjunctivae normal.  Cardiovascular:     Rate and Rhythm: Normal rate and regular rhythm.     Heart sounds: Normal heart sounds.  Pulmonary:     Effort: Pulmonary effort is normal. No respiratory distress.     Breath sounds: Normal  breath sounds.  Musculoskeletal:        General: Normal range of motion.  Skin:    General: Skin is warm and dry.  Neurological:     General: No focal deficit present.     Mental Status: She is alert and oriented to person, place, and time. Mental status is at baseline.  Psychiatric:        Mood and Affect: Mood normal.        Behavior: Behavior normal.        Thought Content: Thought content normal.        Judgment: Judgment normal.     Lab Results  Component Value Date   HGBA1C 6.9 (A) 11/07/2024   HGBA1C 6.1 (A) 03/22/2024   HGBA1C 6.4 (A) 01/19/2024    Lab Results  Component Value Date   CREATININE 0.45 10/18/2024   CREATININE 0.58 03/24/2024   CREATININE 0.64 03/17/2024    Lab Results  Component Value Date   WBC 4.6 11/01/2024   HGB 10.5 (L) 11/01/2024   HCT 31.5 (L) 11/01/2024   PLT 318 11/01/2024   GLUCOSE 108 (H) 10/18/2024   CHOL 181 11/20/2023   TRIG 475 (H) 11/20/2023   HDL 95 11/20/2023   LDLDIRECT 59.0 06/14/2019   LDLCALC  11/20/2023     Comment:     . LDL cholesterol not calculated. Triglyceride levels greater than 400 mg/dL invalidate calculated LDL results. . Reference range: <100 . Desirable range <100 mg/dL for primary prevention;   <70 mg/dL for patients with CHD or diabetic patients  with > or = 2 CHD risk factors. SABRA LDL-C is now calculated using the Martin-Hopkins  calculation, which is a validated novel method providing  better accuracy than the Friedewald equation in the  estimation of LDL-C.  Gladis APPLETHWAITE et al. SANDREA. 7986;689(80): 2061-2068  (http://education.[facility].com/faq/FAQ164)    ALT 12 10/19/2024   AST 28 10/19/2024   NA 138 10/18/2024   K 4.4 10/18/2024   CL 102 10/18/2024   CREATININE 0.45 10/18/2024   BUN 3 (L) 10/18/2024   CO2 29 10/18/2024   TSH 1.47 10/18/2024   HGBA1C 6.9 (A) 11/07/2024   MICROALBUR 0.6 10/19/2024    ECHOCARDIOGRAM COMPLETE Result Date: 11/16/2024    ECHOCARDIOGRAM REPORT    Patient Name:   Mary Haney Date of Exam: 11/16/2024 Medical Rec #:  [id number]       Height:       63.0 in Accession #:    [id number]      Weight:       104.2 lb Date of Birth:  [birth year]  BSA:          1.466 m Patient Age:    53 years        BP:           142/80 mmHg Patient Gender: F               HR:           98 bpm. Exam Location:  [facility] Procedure: 2D Echo, Cardiac Doppler and Color Doppler (Both Spectral and Color            Flow Doppler were utilized during procedure). Indications:     R00.0 Tachycardia  History:         Patient has no prior history of Echocardiogram examinations.                  Arrythmias:Tachycardia, Signs/Symptoms:Edema; Risk                  Factors:Current Smoker and Diabetes.  Sonographer:     Marshall Benders Referring Phys:  [id number] MARGARET G ARNETT Diagnosing Phys: Evalene Lunger MD IMPRESSIONS  1. Left ventricular ejection fraction, by estimation, is 55 to 60%. The left ventricle has normal function. The left ventricle has no regional wall motion abnormalities. Left ventricular diastolic parameters are indeterminate.  2. Right ventricular systolic function is normal. The right ventricular size is normal. There is normal pulmonary artery systolic pressure. The estimated right ventricular systolic pressure is 22.5 mmHg.  3. The mitral valve is normal in structure. Mild mitral valve regurgitation. No evidence of mitral stenosis.  4. The aortic valve is normal in structure. Aortic valve regurgitation is mild. No aortic stenosis is present.  5. The inferior vena cava is normal in size with greater than 50% respiratory variability, suggesting right atrial pressure of 3 mmHg. FINDINGS  Left Ventricle: Left ventricular ejection fraction, by estimation, is 55 to 60%. The left ventricle has normal function. The left ventricle has no regional wall motion abnormalities. Strain was performed and the global longitudinal strain is indeterminate. The left ventricular internal cavity size  was normal in size. There is no left ventricular hypertrophy. Left ventricular diastolic parameters are indeterminate. Right Ventricle: The right ventricular size is normal. No increase in right ventricular wall thickness. Right ventricular systolic function is normal. There is normal pulmonary artery systolic pressure. The tricuspid regurgitant velocity is 2.09 m/s, and  with an assumed right atrial pressure of 5 mmHg, the estimated right ventricular systolic pressure is 22.5 mmHg. Left Atrium: Left atrial size was normal in size. Right Atrium: Right atrial size was normal in size. Pericardium: There is no evidence of pericardial effusion. Mitral Valve: The mitral valve is normal in structure. Mild mitral valve regurgitation. No evidence of mitral valve stenosis. Tricuspid Valve: The tricuspid valve is normal in structure. Tricuspid valve regurgitation is mild . No evidence of tricuspid stenosis. Aortic Valve: The aortic valve is normal in structure. Aortic valve regurgitation is mild. Aortic regurgitation PHT measures 436 msec. No aortic stenosis is present. Aortic valve mean gradient measures 7.0 mmHg. Aortic valve peak gradient measures 12.1 mmHg. Aortic valve area, by VTI measures 2.15 cm. Pulmonic Valve: The pulmonic valve was normal in structure. Pulmonic valve regurgitation is not visualized. No evidence of pulmonic stenosis. Aorta: The aortic root is normal in size and structure. Venous: The inferior vena cava is normal in size with greater than 50% respiratory variability, suggesting right atrial pressure of 3 mmHg. IAS/Shunts: No atrial level shunt detected by  color flow Doppler. Additional Comments: 3D was performed not requiring image post processing on an independent workstation and was indeterminate.  LEFT VENTRICLE PLAX 2D LVIDd:         4.90 cm   Diastology LVIDs:         3.40 cm   LV e' lateral:   8.92 cm/s LV PW:         0.90 cm   LV E/e' lateral: 7.5 LV IVS:        0.80 cm LVOT diam:     1.90 cm  LV SV:         62 LV SV Index:   42 LVOT Area:     2.84 cm  RIGHT VENTRICLE             IVC RV Basal diam:  2.80 cm     IVC diam: 1.30 cm RV Mid diam:    2.00 cm RV S prime:     11.60 cm/s TAPSE (M-mode): 2.8 cm LEFT ATRIUM             Index        RIGHT ATRIUM           Index LA diam:        2.60 cm 1.77 cm/m   RA Area:     14.50 cm LA Vol (A2C):   59.8 ml 40.80 ml/m  RA Volume:   39.50 ml  26.95 ml/m LA Vol (A4C):   51.6 ml 35.21 ml/m LA Biplane Vol: 58.8 ml 40.12 ml/m  AORTIC VALVE AV Area (Vmax):    1.89 cm AV Area (Vmean):   1.85 cm AV Area (VTI):     2.15 cm AV Vmax:           174.00 cm/s AV Vmean:          119.000 cm/s AV VTI:            0.289 m AV Peak Grad:      12.1 mmHg AV Mean Grad:      7.0 mmHg LVOT Vmax:         116.00 cm/s LVOT Vmean:        77.700 cm/s LVOT VTI:          0.219 m LVOT/AV VTI ratio: 0.76 AI PHT:            436 msec  AORTA Ao Root diam: 3.30 cm Ao Asc diam:  3.50 cm MITRAL VALVE                TRICUSPID VALVE MV Area (PHT): 5.42 cm     TR Peak grad:   17.5 mmHg MV Decel Time: 140 msec     TR Vmax:        209.00 cm/s MV E velocity: 67.30 cm/s MV A velocity: 110.00 cm/s  SHUNTS MV E/A ratio:  0.61         Systemic VTI:  0.22 m                             Systemic Diam: 1.90 cm Evalene Lunger MD Electronically signed by Evalene Lunger MD Signature Date/Time: 11/16/2024/2:10:49 PM    Final     Assessment & Plan:  .Alcohol induced fatty liver Assessment & Plan: Her FIB 4 score is indeterminate for cirrhosis.  Last U/s was April ; done because of markedly elevated liver enzymes which have resolved with alohol abstinence.  Recommend repeat u/s  given elevated FIB 4 score,  along with AFP tumor marker   Fibrosis 4 Score = 1.32 (Indeterminate)       Interpretation for patients with NAFLD          <1.30       -  F0-F1 (Low risk)          1.30-2.67 -  Indeterminate           >2.67      -  F3-F4 (High risk)     Validated for ages 30-65         Orders: -     US  ABDOMEN  LIMITED RUQ (LIVER/GB); Future -     Comprehensive metabolic panel with GFR; Future -     AFP tumor marker; Future  Internal hemorrhoids  Elevated ferritin Assessment & Plan: SUSPECTED  to be due to liver etiology given hematology evaluation and negatiive screening for [facility] DNA mutations    Bilateral lower extremity edema Assessment & Plan: Work up thus far notes normal ECHO in the setting of tachycardia, (diastolic dysfunction?) significant proteinuria (which resolved on repeat testing).  Will continue managing with compression stockings,  add spironolactone twice weekly,  and start metoprolol for tachycardia    Tachycardia Assessment & Plan: Adding low dose metoprolol XL .  ECHO reviewed    Other orders -     Metoprolol Succinate ER; Take 1 tablet (25 mg total) by mouth daily.  Dispense: 90 tablet; Refill: 3 -     Spironolactone; Take 1 tablet (50 mg total) by mouth 2 (two) times a week.  Dispense: 45 tablet; Refill: 1    I personally spent a total of 44 minutes in the care of the patient today including preparing to see the patient, getting/reviewing separately obtained history, performing a medically appropriate exam/evaluation, counseling and educating, placing orders, documenting clinical information in the EHR, independently interpreting results, and communicating results.  Follow-up: No follow-ups on file.   Verneita LITTIE Kettering, MD "

## 2024-12-09 NOTE — Assessment & Plan Note (Signed)
 Adding low dose metoprolol XL .  ECHO reviewed

## 2024-12-09 NOTE — Assessment & Plan Note (Signed)
 SUSPECTED  to be due to liver etiology given hematology evaluation and negatiive screening for [facility] DNA mutations

## 2024-12-09 NOTE — Patient Instructions (Addendum)
"   your swelling may be due to multiple issues including chronic venous insufficiency (very common in all people who stand or sit for long periods of time),  fatty liver,  and tachycardia (rapid hart rate)   Please start taking metoprolol XL once daily in the evening to address tachycardia and blood pressure   You may use the spironolactone 2 times weekly for fluid retention  You should wear compression stockings DAILY but take them off at night once you are reclining  We should repeat your ultrasound based on the labs that were done  in November   "

## 2024-12-09 NOTE — Assessment & Plan Note (Signed)
 Work up thus far notes normal ECHO in the setting of tachycardia, (diastolic dysfunction?) significant proteinuria (which resolved on repeat testing).  Will continue managing with compression stockings,  add spironolactone twice weekly,  and start metoprolol for tachycardia

## 2024-12-09 NOTE — Assessment & Plan Note (Addendum)
 Her FIB 4 score is indeterminate for cirrhosis.  Last U/s was April ; done because of markedly elevated liver enzymes which have resolved with alohol abstinence.  Recommend repeat u/s  given elevated FIB 4 score,  along with AFP tumor marker   Fibrosis 4 Score = 1.32 (Indeterminate)       Interpretation for patients with NAFLD          <1.30       -  F0-F1 (Low risk)          1.30-2.67 -  Indeterminate           >2.67      -  F3-F4 (High risk)     Validated for ages 56-65

## 2024-12-13 ENCOUNTER — Ambulatory Visit
Admission: RE | Admit: 2024-12-13 | Discharge: 2024-12-13 | Disposition: A | Discharge status: Home / Self Care | Source: Ambulatory Visit | Attending: Acute Care | Admitting: Acute Care

## 2024-12-13 DIAGNOSIS — Z87891 Personal history of nicotine dependence: Secondary | ICD-10-CM | POA: Insufficient documentation

## 2024-12-13 DIAGNOSIS — Z122 Encounter for screening for malignant neoplasm of respiratory organs: Secondary | ICD-10-CM | POA: Insufficient documentation

## 2024-12-13 DIAGNOSIS — F17210 Nicotine dependence, cigarettes, uncomplicated: Secondary | ICD-10-CM | POA: Insufficient documentation

## 2024-12-21 ENCOUNTER — Telehealth: Payer: Self-pay | Admitting: Acute Care

## 2024-12-21 NOTE — Telephone Encounter (Signed)
 Call report received LDCT:  IMPRESSION: 1. 6.5 mm inferior posterior right lower lobe nodule. Lung-RADS 3, probably benign findings. Short-term follow-up in 6 months is recommended with repeat low-dose chest CT without contrast (please use the following order, CT CHEST LCS NODULE FOLLOW-UP W/O CM). These results will be called to the ordering clinician or representative by the Radiologist Assistant, and communication documented in the PACS or [employer]. 2. Age advanced right coronary artery calcification. 3. 10 mm lateral left breast nodule. Previous mammogram 03/30/2023 and ultrasound 01/23/2020. 4.  Aortic atherosclerosis (ICD10-I70.0). 5. Enlarged pulmonic trunk, indicative of pulmonary arterial hypertension. 6.  Emphysema (ICD10-J43.9).     Electronically Signed   By: Newell Eke M.D.   On: 12/21/2024 09:35

## 2024-12-21 NOTE — Telephone Encounter (Signed)
 LR 3 , so 6 month follow up scan.  Please let her know there was an incidental finding of Age advanced right coronary artery calcification.>> I do not see notation of cardiology or statin. Please ask her if she would like a cardiology consult, or prefer to discuss with her PCP 3. 10 mm lateral left breast nodule. Previous mammogram 03/30/2023 and ultrasound 01/23/2020.>> Please remind her to get her mammogram as she has not had one since 2024.   Please fax results to pcp. Let her know plan for follow up.  6 month follow up scan , due after 06/12/2025.  Thanks so much

## 2024-12-22 ENCOUNTER — Other Ambulatory Visit: Payer: Self-pay

## 2024-12-22 DIAGNOSIS — R911 Solitary pulmonary nodule: Secondary | ICD-10-CM

## 2024-12-22 DIAGNOSIS — Z122 Encounter for screening for malignant neoplasm of respiratory organs: Secondary | ICD-10-CM

## 2024-12-22 DIAGNOSIS — Z87891 Personal history of nicotine dependence: Secondary | ICD-10-CM

## 2024-12-22 NOTE — Telephone Encounter (Signed)
 Spoke with patient and she is in agreement to complete a 6 month follow up CT. Due 06/2025, request call back closer to due date. Order placed. She will complete annual mammogram as she is over due. She is not on a stain. She will discuss statin/mammogram f/u with PCP. Results and plan to PCP.

## 2024-12-23 ENCOUNTER — Other Ambulatory Visit (INDEPENDENT_AMBULATORY_CARE_PROVIDER_SITE_OTHER)

## 2024-12-23 DIAGNOSIS — K700 Alcoholic fatty liver: Secondary | ICD-10-CM | POA: Diagnosis not present

## 2024-12-23 LAB — COMPREHENSIVE METABOLIC PANEL WITH GFR
ALT: 9 U/L (ref 3–35)
AST: 14 U/L (ref 5–37)
Albumin: 4.6 g/dL (ref 3.5–5.2)
Alkaline Phosphatase: 63 U/L (ref 39–117)
BUN: 7 mg/dL (ref 6–23)
CO2: 30 meq/L (ref 19–32)
Calcium: 10.3 mg/dL (ref 8.4–10.5)
Chloride: 97 meq/L (ref 96–112)
Creatinine, Ser: 0.62 mg/dL (ref 0.40–1.20)
GFR: 102.55 mL/min
Glucose, Bld: 186 mg/dL — ABNORMAL HIGH (ref 70–99)
Potassium: 4.2 meq/L (ref 3.5–5.1)
Sodium: 134 meq/L — ABNORMAL LOW (ref 135–145)
Total Bilirubin: 0.4 mg/dL (ref 0.2–1.2)
Total Protein: 8.2 g/dL (ref 6.0–8.3)

## 2024-12-24 ENCOUNTER — Ambulatory Visit: Payer: Self-pay | Admitting: Internal Medicine

## 2024-12-24 LAB — AFP TUMOR MARKER: AFP, Serum, Tumor Marker: <1.8 ng/mL (ref 0.0–9.2)

## 2025-01-04 ENCOUNTER — Other Ambulatory Visit: Payer: Self-pay | Admitting: Family

## 2025-01-04 ENCOUNTER — Telehealth: Payer: Self-pay | Admitting: Family

## 2025-01-04 ENCOUNTER — Encounter: Payer: Self-pay | Admitting: Family

## 2025-01-04 DIAGNOSIS — N6320 Unspecified lump in the left breast, unspecified quadrant: Secondary | ICD-10-CM

## 2025-01-04 NOTE — Telephone Encounter (Signed)
 LVM to call back to office to discuss message below

## 2025-01-04 NOTE — Telephone Encounter (Signed)
 Call patient I sent patient a very detailed MyChart message 01/04/2025 in regards to CT lung cancer screening.  There were several findings.  She has appointment with me scheduled in the spring  Is she is willing to move this appointment up either virtual or in person?    Please ensure she can give us  blood pressure and heart rate if she opts to do virtual.  Please schedule lipid panel blood work as previously ordered 11/17/23; you may sch all labs ordered 11/16/24.  She also needs to call [facility] and her to schedule her mammogram, orders are in.

## 2025-01-06 NOTE — Telephone Encounter (Signed)
LVM to call back to office to discuss results

## 2025-01-10 NOTE — Telephone Encounter (Signed)
 LVM to call back to office

## 2025-01-11 NOTE — Telephone Encounter (Signed)
 Letter mailed to patient.

## 2025-01-17 ENCOUNTER — Ambulatory Visit

## 2025-03-06 ENCOUNTER — Inpatient Hospital Stay

## 2025-03-09 ENCOUNTER — Ambulatory Visit: Admitting: Family

## 2025-03-09 ENCOUNTER — Ambulatory Visit: Admitting: Internal Medicine

## 2025-03-13 ENCOUNTER — Inpatient Hospital Stay: Admitting: Oncology
